# Patient Record
Sex: Male | Born: 1950
Health system: Southern US, Community
[De-identification: ages and names within clinical notes are randomized; demographics above are authoritative.]

## PROBLEM LIST (undated history)

## (undated) DIAGNOSIS — R809 Proteinuria, unspecified: Secondary | ICD-10-CM

## (undated) DIAGNOSIS — I219 Acute myocardial infarction, unspecified: Secondary | ICD-10-CM

## (undated) DIAGNOSIS — G4730 Sleep apnea, unspecified: Secondary | ICD-10-CM

## (undated) DIAGNOSIS — E785 Hyperlipidemia, unspecified: Secondary | ICD-10-CM

## (undated) DIAGNOSIS — N281 Cyst of kidney, acquired: Secondary | ICD-10-CM

## (undated) DIAGNOSIS — T7840XA Allergy, unspecified, initial encounter: Secondary | ICD-10-CM

## (undated) DIAGNOSIS — R011 Cardiac murmur, unspecified: Secondary | ICD-10-CM

## (undated) DIAGNOSIS — H269 Unspecified cataract: Secondary | ICD-10-CM

## (undated) DIAGNOSIS — I2510 Atherosclerotic heart disease of native coronary artery without angina pectoris: Secondary | ICD-10-CM

## (undated) DIAGNOSIS — Z5189 Encounter for other specified aftercare: Secondary | ICD-10-CM

## (undated) DIAGNOSIS — I10 Essential (primary) hypertension: Secondary | ICD-10-CM

## (undated) DIAGNOSIS — I77810 Thoracic aortic ectasia: Secondary | ICD-10-CM

## (undated) DIAGNOSIS — K5790 Diverticulosis of intestine, part unspecified, without perforation or abscess without bleeding: Secondary | ICD-10-CM

## (undated) DIAGNOSIS — R0683 Snoring: Secondary | ICD-10-CM

## (undated) DIAGNOSIS — Z951 Presence of aortocoronary bypass graft: Secondary | ICD-10-CM

## (undated) DIAGNOSIS — M1990 Unspecified osteoarthritis, unspecified site: Secondary | ICD-10-CM

## (undated) HISTORY — DX: Presence of aortocoronary bypass graft: Z95.1

## (undated) HISTORY — DX: Cardiac murmur, unspecified: R01.1

## (undated) HISTORY — DX: Hyperlipidemia, unspecified: E78.5

## (undated) HISTORY — DX: Thoracic aortic ectasia: I77.810

## (undated) HISTORY — DX: Allergy, unspecified, initial encounter: T78.40XA

## (undated) HISTORY — DX: Essential (primary) hypertension: I10

## (undated) HISTORY — DX: Atherosclerotic heart disease of native coronary artery without angina pectoris: I25.10

## (undated) HISTORY — DX: Encounter for other specified aftercare: Z51.89

## (undated) HISTORY — PX: OTHER SURGICAL HISTORY: SHX169

## (undated) HISTORY — DX: Sleep apnea, unspecified: G47.30

## (undated) HISTORY — DX: Proteinuria, unspecified: R80.9

## (undated) HISTORY — DX: Acute myocardial infarction, unspecified: I21.9

## (undated) HISTORY — PX: COLONOSCOPY: SHX174

## (undated) HISTORY — DX: Unspecified osteoarthritis, unspecified site: M19.90

## (undated) HISTORY — DX: Diverticulosis of intestine, part unspecified, without perforation or abscess without bleeding: K57.90

## (undated) HISTORY — DX: Snoring: R06.83

## (undated) HISTORY — PX: POLYPECTOMY: SHX149

## (undated) HISTORY — DX: Unspecified cataract: H26.9

## (undated) HISTORY — DX: Cyst of kidney, acquired: N28.1

## (undated) HISTORY — PX: TONSILECTOMY, ADENOIDECTOMY, BILATERAL MYRINGOTOMY AND TUBES: SHX2538

---

## 1976-01-31 HISTORY — PX: APPENDECTOMY: SHX54

## 1976-07-02 HISTORY — PX: APPENDECTOMY: SHX54

## 1997-07-02 HISTORY — PX: OTHER SURGICAL HISTORY: SHX169

## 1997-12-22 ENCOUNTER — Ambulatory Visit: Admission: RE | Admit: 1997-12-22 | Discharge: 1997-12-22 | Payer: Self-pay | Admitting: Internal Medicine

## 1998-01-19 ENCOUNTER — Observation Stay: Admission: RE | Admit: 1998-01-19 | Discharge: 1998-01-20 | Payer: Self-pay | Admitting: Otolaryngology

## 1998-04-01 ENCOUNTER — Ambulatory Visit: Admission: RE | Admit: 1998-04-01 | Discharge: 1998-04-01 | Payer: Self-pay | Admitting: Otolaryngology

## 2004-07-07 ENCOUNTER — Ambulatory Visit: Payer: Self-pay | Admitting: Internal Medicine

## 2004-07-14 ENCOUNTER — Ambulatory Visit: Payer: Self-pay | Admitting: Internal Medicine

## 2004-08-21 ENCOUNTER — Encounter: Admission: RE | Admit: 2004-08-21 | Discharge: 2004-08-21 | Payer: Self-pay | Admitting: Internal Medicine

## 2004-08-21 ENCOUNTER — Ambulatory Visit: Payer: Self-pay | Admitting: Internal Medicine

## 2004-10-06 ENCOUNTER — Ambulatory Visit: Payer: Self-pay | Admitting: Internal Medicine

## 2004-10-17 ENCOUNTER — Ambulatory Visit: Payer: Self-pay | Admitting: Internal Medicine

## 2005-02-15 ENCOUNTER — Ambulatory Visit: Payer: Self-pay | Admitting: Internal Medicine

## 2005-02-23 ENCOUNTER — Ambulatory Visit: Payer: Self-pay | Admitting: Internal Medicine

## 2005-06-11 ENCOUNTER — Ambulatory Visit: Payer: Self-pay | Admitting: Internal Medicine

## 2005-06-18 ENCOUNTER — Ambulatory Visit: Payer: Self-pay | Admitting: Internal Medicine

## 2005-07-02 LAB — HM COLONOSCOPY

## 2005-09-17 ENCOUNTER — Ambulatory Visit: Payer: Self-pay | Admitting: Internal Medicine

## 2005-09-21 ENCOUNTER — Ambulatory Visit: Payer: Self-pay | Admitting: Internal Medicine

## 2006-01-01 ENCOUNTER — Ambulatory Visit: Payer: Self-pay | Admitting: Internal Medicine

## 2006-01-09 ENCOUNTER — Ambulatory Visit: Payer: Self-pay | Admitting: Internal Medicine

## 2006-04-05 ENCOUNTER — Ambulatory Visit: Payer: Self-pay | Admitting: Internal Medicine

## 2006-04-11 ENCOUNTER — Ambulatory Visit: Payer: Self-pay | Admitting: Internal Medicine

## 2006-05-28 ENCOUNTER — Ambulatory Visit: Payer: Self-pay | Admitting: Internal Medicine

## 2006-06-17 ENCOUNTER — Ambulatory Visit: Payer: Self-pay | Admitting: Internal Medicine

## 2006-07-09 ENCOUNTER — Ambulatory Visit: Payer: Self-pay | Admitting: Internal Medicine

## 2006-07-09 LAB — CONVERTED CEMR LAB
ALT: 30 units/L (ref 0–40)
Albumin: 3.8 g/dL (ref 3.5–5.2)
Alkaline Phosphatase: 46 units/L (ref 39–117)
Calcium: 9.4 mg/dL (ref 8.4–10.5)
Cholesterol: 146 mg/dL (ref 0–200)
Glucose, Bld: 76 mg/dL (ref 70–99)
Hgb A1c MFr Bld: 6.1 % — ABNORMAL HIGH (ref 4.6–6.0)
Potassium: 4.4 meq/L (ref 3.5–5.1)
Total Bilirubin: 1.3 mg/dL — ABNORMAL HIGH (ref 0.3–1.2)
Total Protein: 6.9 g/dL (ref 6.0–8.3)

## 2006-07-16 ENCOUNTER — Ambulatory Visit: Payer: Self-pay | Admitting: Internal Medicine

## 2006-08-10 ENCOUNTER — Ambulatory Visit: Payer: Self-pay | Admitting: Family Medicine

## 2006-10-11 ENCOUNTER — Ambulatory Visit: Payer: Self-pay | Admitting: Internal Medicine

## 2006-10-11 LAB — CONVERTED CEMR LAB
ALT: 22 units/L (ref 0–40)
Albumin: 4 g/dL (ref 3.5–5.2)
Alkaline Phosphatase: 39 units/L (ref 39–117)
BUN: 22 mg/dL (ref 6–23)
CO2: 29 meq/L (ref 19–32)
Calcium: 9 mg/dL (ref 8.4–10.5)
Direct LDL: 71.4 mg/dL
GFR calc Af Amer: 100 mL/min
GFR calc non Af Amer: 82 mL/min
Potassium: 4.5 meq/L (ref 3.5–5.1)
Total CHOL/HDL Ratio: 4
Triglycerides: 263 mg/dL (ref 0–149)
VLDL: 53 mg/dL — ABNORMAL HIGH (ref 0–40)

## 2006-10-18 ENCOUNTER — Ambulatory Visit: Payer: Self-pay | Admitting: Internal Medicine

## 2006-12-06 ENCOUNTER — Ambulatory Visit: Payer: Self-pay | Admitting: Internal Medicine

## 2007-01-25 DIAGNOSIS — B009 Herpesviral infection, unspecified: Secondary | ICD-10-CM | POA: Insufficient documentation

## 2007-01-25 DIAGNOSIS — E782 Mixed hyperlipidemia: Secondary | ICD-10-CM | POA: Insufficient documentation

## 2007-01-25 DIAGNOSIS — E785 Hyperlipidemia, unspecified: Secondary | ICD-10-CM | POA: Insufficient documentation

## 2007-01-25 DIAGNOSIS — R809 Proteinuria, unspecified: Secondary | ICD-10-CM | POA: Insufficient documentation

## 2007-04-04 ENCOUNTER — Ambulatory Visit: Payer: Self-pay | Admitting: Internal Medicine

## 2007-04-04 LAB — CONVERTED CEMR LAB
BUN: 16 mg/dL (ref 6–23)
CO2: 31 meq/L (ref 19–32)
Calcium: 9 mg/dL (ref 8.4–10.5)
Chloride: 105 meq/L (ref 96–112)
Creatinine, Ser: 1 mg/dL (ref 0.4–1.5)
Hgb A1c MFr Bld: 6.4 % — ABNORMAL HIGH (ref 4.6–6.0)
Vit D, 1,25-Dihydroxy: 46 (ref 30–89)

## 2007-04-11 ENCOUNTER — Ambulatory Visit: Payer: Self-pay | Admitting: Internal Medicine

## 2007-06-04 ENCOUNTER — Encounter: Payer: Self-pay | Admitting: Internal Medicine

## 2007-07-03 HISTORY — PX: CHOLECYSTECTOMY: SHX55

## 2007-07-03 HISTORY — PX: CORONARY ARTERY BYPASS GRAFT: SHX141

## 2007-07-07 ENCOUNTER — Telehealth: Payer: Self-pay | Admitting: Internal Medicine

## 2007-07-09 ENCOUNTER — Ambulatory Visit: Payer: Self-pay | Admitting: Internal Medicine

## 2007-07-09 DIAGNOSIS — E1169 Type 2 diabetes mellitus with other specified complication: Secondary | ICD-10-CM

## 2007-07-09 DIAGNOSIS — E669 Obesity, unspecified: Secondary | ICD-10-CM | POA: Insufficient documentation

## 2007-07-09 LAB — CONVERTED CEMR LAB
ALT: 25 units/L (ref 0–53)
AST: 22 units/L (ref 0–37)
Bilirubin, Direct: 0.1 mg/dL (ref 0.0–0.3)
CO2: 31 meq/L (ref 19–32)
Calcium: 9.3 mg/dL (ref 8.4–10.5)
Chloride: 104 meq/L (ref 96–112)
Cholesterol: 152 mg/dL (ref 0–200)
GFR calc Af Amer: 89 mL/min
GFR calc non Af Amer: 74 mL/min
Glucose, Bld: 130 mg/dL — ABNORMAL HIGH (ref 70–99)
Hgb A1c MFr Bld: 6.4 % — ABNORMAL HIGH (ref 4.6–6.0)
Sodium: 142 meq/L (ref 135–145)
Total CHOL/HDL Ratio: 5.6
Total Protein: 6.4 g/dL (ref 6.0–8.3)
Triglycerides: 251 mg/dL (ref 0–149)

## 2007-07-18 ENCOUNTER — Ambulatory Visit: Payer: Self-pay | Admitting: Internal Medicine

## 2007-09-04 ENCOUNTER — Telehealth: Payer: Self-pay | Admitting: Internal Medicine

## 2007-09-10 ENCOUNTER — Encounter: Payer: Self-pay | Admitting: Internal Medicine

## 2007-09-12 ENCOUNTER — Encounter: Payer: Self-pay | Admitting: Internal Medicine

## 2007-10-06 ENCOUNTER — Encounter: Payer: Self-pay | Admitting: Internal Medicine

## 2007-10-08 ENCOUNTER — Telehealth: Payer: Self-pay | Admitting: Internal Medicine

## 2007-11-11 ENCOUNTER — Ambulatory Visit: Payer: Self-pay | Admitting: Internal Medicine

## 2007-11-11 DIAGNOSIS — R17 Unspecified jaundice: Secondary | ICD-10-CM | POA: Insufficient documentation

## 2007-11-11 DIAGNOSIS — R109 Unspecified abdominal pain: Secondary | ICD-10-CM | POA: Insufficient documentation

## 2007-11-11 LAB — CONVERTED CEMR LAB
Basophils Absolute: 0 10*3/uL (ref 0.0–0.1)
Bilirubin, Direct: 3.8 mg/dL — ABNORMAL HIGH (ref 0.0–0.3)
Calcium: 9.1 mg/dL (ref 8.4–10.5)
Crystals: NEGATIVE
Eosinophils Absolute: 0.1 10*3/uL (ref 0.0–0.7)
GFR calc Af Amer: 80 mL/min
GFR calc non Af Amer: 66 mL/min
Glucose, Bld: 133 mg/dL — ABNORMAL HIGH (ref 70–99)
HCT: 43.4 % (ref 39.0–52.0)
Hemoglobin: 14.9 g/dL (ref 13.0–17.0)
Hep A IgM: NEGATIVE
Hep B C IgM: NEGATIVE
Hepatitis B Surface Ag: NEGATIVE
MCHC: 34.2 g/dL (ref 30.0–36.0)
MCV: 92.8 fL (ref 78.0–100.0)
Monocytes Absolute: 0.6 10*3/uL (ref 0.1–1.0)
Neutro Abs: 4.1 10*3/uL (ref 1.4–7.7)
Nitrite: NEGATIVE
Platelets: 114 10*3/uL — ABNORMAL LOW (ref 150–400)
Potassium: 4 meq/L (ref 3.5–5.1)
RDW: 12.2 % (ref 11.5–14.6)
Sodium: 140 meq/L (ref 135–145)
Total Protein, Urine: 100 mg/dL — AB
Total Protein: 6.8 g/dL (ref 6.0–8.3)
pH: 5.5 (ref 5.0–8.0)

## 2007-11-13 ENCOUNTER — Telehealth: Payer: Self-pay | Admitting: Internal Medicine

## 2007-11-13 ENCOUNTER — Ambulatory Visit: Payer: Self-pay | Admitting: Internal Medicine

## 2007-11-13 DIAGNOSIS — R945 Abnormal results of liver function studies: Secondary | ICD-10-CM

## 2007-11-13 LAB — CONVERTED CEMR LAB
Hep A Total Ab: POSITIVE — AB
Hep B S Ab: POSITIVE — AB

## 2007-11-14 LAB — CONVERTED CEMR LAB
ALT: 165 units/L — ABNORMAL HIGH (ref 0–53)
AST: 72 units/L — ABNORMAL HIGH (ref 0–37)
Albumin: 3.7 g/dL (ref 3.5–5.2)
Alkaline Phosphatase: 81 units/L (ref 39–117)
BUN: 13 mg/dL (ref 6–23)
Bilirubin, Direct: 2.2 mg/dL — ABNORMAL HIGH (ref 0.0–0.3)
CO2: 27 meq/L (ref 19–32)
Calcium: 9.1 mg/dL (ref 8.4–10.5)
Chloride: 102 meq/L (ref 96–112)
Creatinine, Ser: 1.2 mg/dL (ref 0.4–1.5)
GFR calc Af Amer: 80 mL/min
GFR calc non Af Amer: 66 mL/min
Glucose, Bld: 138 mg/dL — ABNORMAL HIGH (ref 70–99)
Hgb A1c MFr Bld: 6.8 % — ABNORMAL HIGH (ref 4.6–6.0)
Potassium: 4.1 meq/L (ref 3.5–5.1)
Sodium: 137 meq/L (ref 135–145)
Total Bilirubin: 4.5 mg/dL — ABNORMAL HIGH (ref 0.3–1.2)
Total Protein: 7.2 g/dL (ref 6.0–8.3)

## 2007-11-15 ENCOUNTER — Telehealth: Payer: Self-pay | Admitting: Internal Medicine

## 2007-11-17 ENCOUNTER — Telehealth: Payer: Self-pay | Admitting: Internal Medicine

## 2007-11-17 ENCOUNTER — Ambulatory Visit: Payer: Self-pay | Admitting: Internal Medicine

## 2007-11-19 LAB — CONVERTED CEMR LAB
AST: 59 units/L — ABNORMAL HIGH (ref 0–37)
Alkaline Phosphatase: 60 units/L (ref 39–117)
Basophils Absolute: 0.1 10*3/uL (ref 0.0–0.1)
Chloride: 108 meq/L (ref 96–112)
Creatinine, Ser: 1.1 mg/dL (ref 0.4–1.5)
Eosinophils Absolute: 0.1 10*3/uL (ref 0.0–0.7)
GFR calc non Af Amer: 73 mL/min
MCHC: 34.9 g/dL (ref 30.0–36.0)
MCV: 92.6 fL (ref 78.0–100.0)
Neutrophils Relative %: 54.8 % (ref 43.0–77.0)
Platelets: 187 10*3/uL (ref 150–400)
Potassium: 4.2 meq/L (ref 3.5–5.1)
RDW: 11.8 % (ref 11.5–14.6)
Sodium: 141 meq/L (ref 135–145)
Total Bilirubin: 2 mg/dL — ABNORMAL HIGH (ref 0.3–1.2)

## 2007-11-20 ENCOUNTER — Ambulatory Visit: Payer: Self-pay | Admitting: Internal Medicine

## 2007-11-20 ENCOUNTER — Encounter: Payer: Self-pay | Admitting: Internal Medicine

## 2007-11-20 ENCOUNTER — Encounter: Admission: RE | Admit: 2007-11-20 | Discharge: 2007-11-20 | Payer: Self-pay | Admitting: Internal Medicine

## 2007-11-20 LAB — CONVERTED CEMR LAB
ALT: 81 units/L — ABNORMAL HIGH (ref 0–53)
AST: 43 units/L — ABNORMAL HIGH (ref 0–37)
Total Bilirubin: 1.4 mg/dL — ABNORMAL HIGH (ref 0.3–1.2)

## 2007-11-21 ENCOUNTER — Ambulatory Visit: Payer: Self-pay | Admitting: Internal Medicine

## 2007-11-21 DIAGNOSIS — K8021 Calculus of gallbladder without cholecystitis with obstruction: Secondary | ICD-10-CM | POA: Insufficient documentation

## 2007-11-21 DIAGNOSIS — N281 Cyst of kidney, acquired: Secondary | ICD-10-CM

## 2007-11-27 ENCOUNTER — Ambulatory Visit (HOSPITAL_COMMUNITY): Admission: RE | Admit: 2007-11-27 | Discharge: 2007-11-27 | Payer: Self-pay | Admitting: Internal Medicine

## 2007-12-15 ENCOUNTER — Telehealth: Payer: Self-pay | Admitting: Internal Medicine

## 2008-01-06 ENCOUNTER — Ambulatory Visit: Payer: Self-pay | Admitting: Internal Medicine

## 2008-01-06 LAB — CONVERTED CEMR LAB
ALT: 45 units/L (ref 0–53)
Total Bilirubin: 1.4 mg/dL — ABNORMAL HIGH (ref 0.3–1.2)

## 2008-01-08 ENCOUNTER — Encounter: Payer: Self-pay | Admitting: Internal Medicine

## 2008-01-13 ENCOUNTER — Telehealth: Payer: Self-pay | Admitting: Internal Medicine

## 2008-01-19 ENCOUNTER — Telehealth: Payer: Self-pay | Admitting: Internal Medicine

## 2008-01-19 ENCOUNTER — Ambulatory Visit: Payer: Self-pay | Admitting: Internal Medicine

## 2008-01-19 DIAGNOSIS — IMO0002 Reserved for concepts with insufficient information to code with codable children: Secondary | ICD-10-CM

## 2008-02-05 ENCOUNTER — Ambulatory Visit: Payer: Self-pay | Admitting: Internal Medicine

## 2008-02-05 LAB — CONVERTED CEMR LAB
ALT: 31 units/L (ref 0–53)
AST: 24 units/L (ref 0–37)
Albumin: 4.4 g/dL (ref 3.5–5.2)
BUN: 12 mg/dL (ref 6–23)
Basophils Relative: 0.9 % (ref 0.0–3.0)
CO2: 31 meq/L (ref 19–32)
Chloride: 104 meq/L (ref 96–112)
Creatinine, Ser: 1.1 mg/dL (ref 0.4–1.5)
Eosinophils Relative: 1 % (ref 0.0–5.0)
Glucose, Bld: 108 mg/dL — ABNORMAL HIGH (ref 70–99)
Lymphocytes Relative: 37.6 % (ref 12.0–46.0)
MCV: 91.1 fL (ref 78.0–100.0)
Monocytes Relative: 9.1 % (ref 3.0–12.0)
Neutrophils Relative %: 51.4 % (ref 43.0–77.0)
RBC: 4.87 M/uL (ref 4.22–5.81)
TSH: 1.17 microintl units/mL (ref 0.35–5.50)
WBC: 6.3 10*3/uL (ref 4.5–10.5)

## 2008-02-13 ENCOUNTER — Encounter: Payer: Self-pay | Admitting: Internal Medicine

## 2008-02-13 ENCOUNTER — Ambulatory Visit: Payer: Self-pay

## 2008-02-23 ENCOUNTER — Ambulatory Visit (HOSPITAL_COMMUNITY): Admission: RE | Admit: 2008-02-23 | Discharge: 2008-02-23 | Payer: Self-pay | Admitting: General Surgery

## 2008-02-23 ENCOUNTER — Encounter (INDEPENDENT_AMBULATORY_CARE_PROVIDER_SITE_OTHER): Payer: Self-pay | Admitting: General Surgery

## 2008-02-23 HISTORY — PX: CHOLECYSTECTOMY: SHX55

## 2008-03-11 ENCOUNTER — Encounter: Payer: Self-pay | Admitting: Internal Medicine

## 2008-03-16 ENCOUNTER — Ambulatory Visit: Payer: Self-pay | Admitting: Internal Medicine

## 2008-03-31 ENCOUNTER — Ambulatory Visit: Payer: Self-pay | Admitting: Cardiology

## 2008-03-31 LAB — CONVERTED CEMR LAB
AST: 23 units/L (ref 0–37)
Basophils Absolute: 0 10*3/uL (ref 0.0–0.1)
Basophils Relative: 0.6 % (ref 0.0–3.0)
Chloride: 99 meq/L (ref 96–112)
Creatinine, Ser: 1.1 mg/dL (ref 0.4–1.5)
Eosinophils Absolute: 0.1 10*3/uL (ref 0.0–0.7)
GFR calc Af Amer: 89 mL/min
GFR calc non Af Amer: 73 mL/min
HCT: 43.3 % (ref 39.0–52.0)
MCHC: 35.5 g/dL (ref 30.0–36.0)
MCV: 91.2 fL (ref 78.0–100.0)
Monocytes Absolute: 0.7 10*3/uL (ref 0.1–1.0)
Neutrophils Relative %: 58.4 % (ref 43.0–77.0)
Platelets: 154 10*3/uL (ref 150–400)
Potassium: 3.7 meq/L (ref 3.5–5.1)
Total Bilirubin: 1.2 mg/dL (ref 0.3–1.2)
aPTT: 28.2 s (ref 21.7–29.8)

## 2008-04-01 DIAGNOSIS — Z951 Presence of aortocoronary bypass graft: Secondary | ICD-10-CM

## 2008-04-01 HISTORY — DX: Presence of aortocoronary bypass graft: Z95.1

## 2008-04-02 ENCOUNTER — Ambulatory Visit: Payer: Self-pay | Admitting: Vascular Surgery

## 2008-04-02 ENCOUNTER — Ambulatory Visit: Payer: Self-pay | Admitting: Internal Medicine

## 2008-04-02 ENCOUNTER — Inpatient Hospital Stay (HOSPITAL_BASED_OUTPATIENT_CLINIC_OR_DEPARTMENT_OTHER): Admission: RE | Admit: 2008-04-02 | Discharge: 2008-04-02 | Payer: Self-pay | Admitting: Internal Medicine

## 2008-04-02 ENCOUNTER — Ambulatory Visit (HOSPITAL_COMMUNITY)
Admission: RE | Admit: 2008-04-02 | Discharge: 2008-04-02 | Payer: Self-pay | Admitting: Thoracic Surgery (Cardiothoracic Vascular Surgery)

## 2008-04-02 ENCOUNTER — Encounter: Payer: Self-pay | Admitting: Thoracic Surgery (Cardiothoracic Vascular Surgery)

## 2008-04-05 ENCOUNTER — Ambulatory Visit: Payer: Self-pay | Admitting: Thoracic Surgery (Cardiothoracic Vascular Surgery)

## 2008-04-06 ENCOUNTER — Ambulatory Visit: Payer: Self-pay | Admitting: Cardiology

## 2008-04-06 ENCOUNTER — Inpatient Hospital Stay (HOSPITAL_COMMUNITY): Admission: EM | Admit: 2008-04-06 | Discharge: 2008-04-12 | Payer: Self-pay | Admitting: Emergency Medicine

## 2008-04-06 ENCOUNTER — Ambulatory Visit: Payer: Self-pay | Admitting: Thoracic Surgery (Cardiothoracic Vascular Surgery)

## 2008-04-07 ENCOUNTER — Encounter: Payer: Self-pay | Admitting: Cardiology

## 2008-04-08 ENCOUNTER — Ambulatory Visit (HOSPITAL_COMMUNITY)
Admission: RE | Admit: 2008-04-08 | Discharge: 2008-04-08 | Payer: Self-pay | Admitting: Thoracic Surgery (Cardiothoracic Vascular Surgery)

## 2008-04-08 HISTORY — PX: CORONARY ARTERY BYPASS GRAFT: SHX141

## 2008-04-09 ENCOUNTER — Telehealth (INDEPENDENT_AMBULATORY_CARE_PROVIDER_SITE_OTHER): Payer: Self-pay | Admitting: *Deleted

## 2008-04-12 ENCOUNTER — Ambulatory Visit: Payer: Self-pay | Admitting: Thoracic Surgery (Cardiothoracic Vascular Surgery)

## 2008-04-29 ENCOUNTER — Ambulatory Visit: Payer: Self-pay | Admitting: Cardiology

## 2008-05-03 ENCOUNTER — Ambulatory Visit: Payer: Self-pay | Admitting: Thoracic Surgery (Cardiothoracic Vascular Surgery)

## 2008-05-03 ENCOUNTER — Encounter
Admission: RE | Admit: 2008-05-03 | Discharge: 2008-05-03 | Payer: Self-pay | Admitting: Thoracic Surgery (Cardiothoracic Vascular Surgery)

## 2008-05-06 ENCOUNTER — Encounter (HOSPITAL_COMMUNITY): Admission: RE | Admit: 2008-05-06 | Discharge: 2008-06-30 | Payer: Self-pay | Admitting: Cardiology

## 2008-05-24 ENCOUNTER — Ambulatory Visit: Payer: Self-pay | Admitting: Cardiology

## 2008-06-07 ENCOUNTER — Ambulatory Visit: Payer: Self-pay | Admitting: Internal Medicine

## 2008-06-07 LAB — CONVERTED CEMR LAB
ALT: 16 units/L (ref 0–53)
Albumin: 4.1 g/dL (ref 3.5–5.2)
BUN: 12 mg/dL (ref 6–23)
Bilirubin, Direct: 0.2 mg/dL (ref 0.0–0.3)
CO2: 30 meq/L (ref 19–32)
Calcium: 9.7 mg/dL (ref 8.4–10.5)
Creatinine, Ser: 1.1 mg/dL (ref 0.4–1.5)
GFR calc Af Amer: 89 mL/min
Glucose, Bld: 87 mg/dL (ref 70–99)
HDL: 30.3 mg/dL — ABNORMAL LOW (ref >39.0)
Hgb A1c MFr Bld: 5.3 % (ref 4.6–6.0)
Sodium: 144 meq/L (ref 135–145)
Total Protein: 6.8 g/dL (ref 6.0–8.3)
Triglycerides: 147 mg/dL (ref 0–149)
VLDL: 29 mg/dL (ref 0–40)

## 2008-06-14 ENCOUNTER — Ambulatory Visit: Payer: Self-pay | Admitting: Internal Medicine

## 2008-06-14 DIAGNOSIS — K5900 Constipation, unspecified: Secondary | ICD-10-CM | POA: Insufficient documentation

## 2008-06-14 DIAGNOSIS — M79609 Pain in unspecified limb: Secondary | ICD-10-CM | POA: Insufficient documentation

## 2008-07-02 ENCOUNTER — Encounter (HOSPITAL_COMMUNITY): Admission: RE | Admit: 2008-07-02 | Discharge: 2008-08-30 | Payer: Self-pay | Admitting: Cardiology

## 2008-07-08 ENCOUNTER — Ambulatory Visit: Payer: Self-pay | Admitting: Cardiology

## 2008-08-31 ENCOUNTER — Telehealth: Payer: Self-pay | Admitting: Internal Medicine

## 2008-09-16 ENCOUNTER — Ambulatory Visit: Payer: Self-pay | Admitting: Internal Medicine

## 2008-09-16 LAB — CONVERTED CEMR LAB
ALT: 19 units/L (ref 0–53)
AST: 22 units/L (ref 0–37)
Calcium: 9.6 mg/dL (ref 8.4–10.5)
Cholesterol: 119 mg/dL (ref 0–200)
GFR calc non Af Amer: 73.1 mL/min (ref >60)
HDL: 39 mg/dL — ABNORMAL LOW (ref >39.00)
Hgb A1c MFr Bld: 5.7 % (ref 4.6–6.5)
Potassium: 4.8 meq/L (ref 3.5–5.1)
Sodium: 143 meq/L (ref 135–145)
Total Bilirubin: 1 mg/dL (ref 0.3–1.2)
Total Protein: 6.8 g/dL (ref 6.0–8.3)
Uric Acid, Serum: 7.8 mg/dL (ref 4.0–7.8)
VLDL: 22.4 mg/dL (ref 0.0–40.0)

## 2008-09-24 ENCOUNTER — Ambulatory Visit: Payer: Self-pay | Admitting: Internal Medicine

## 2008-09-24 ENCOUNTER — Telehealth: Payer: Self-pay | Admitting: Internal Medicine

## 2008-09-24 DIAGNOSIS — N529 Male erectile dysfunction, unspecified: Secondary | ICD-10-CM | POA: Insufficient documentation

## 2008-09-30 ENCOUNTER — Encounter: Payer: Self-pay | Admitting: Internal Medicine

## 2008-10-25 ENCOUNTER — Telehealth: Payer: Self-pay | Admitting: Internal Medicine

## 2008-11-07 DIAGNOSIS — Z951 Presence of aortocoronary bypass graft: Secondary | ICD-10-CM | POA: Insufficient documentation

## 2008-11-08 ENCOUNTER — Ambulatory Visit: Payer: Self-pay | Admitting: Cardiology

## 2008-11-08 ENCOUNTER — Encounter (INDEPENDENT_AMBULATORY_CARE_PROVIDER_SITE_OTHER): Payer: Self-pay | Admitting: *Deleted

## 2009-01-07 ENCOUNTER — Ambulatory Visit: Payer: Self-pay | Admitting: Internal Medicine

## 2009-01-07 LAB — CONVERTED CEMR LAB
BUN: 17 mg/dL (ref 6–23)
Basophils Relative: 0.5 % (ref 0.0–3.0)
CO2: 32 meq/L (ref 19–32)
Calcium: 9.5 mg/dL (ref 8.4–10.5)
Chloride: 102 meq/L (ref 96–112)
Cholesterol: 166 mg/dL (ref 0–200)
Creatinine, Ser: 1.1 mg/dL (ref 0.4–1.5)
Eosinophils Relative: 1.5 % (ref 0.0–5.0)
Hemoglobin: 16 g/dL (ref 13.0–17.0)
Hgb A1c MFr Bld: 5.9 % (ref 4.6–6.5)
LDL Cholesterol: 101 mg/dL — ABNORMAL HIGH (ref 0–99)
Lymphocytes Relative: 32.1 % (ref 12.0–46.0)
Neutro Abs: 4.4 10*3/uL (ref 1.4–7.7)
Neutrophils Relative %: 55.9 % (ref 43.0–77.0)
RBC: 4.8 M/uL (ref 4.22–5.81)
Total CHOL/HDL Ratio: 4
Triglycerides: 137 mg/dL (ref 0.0–149.0)
WBC: 7.8 10*3/uL (ref 4.5–10.5)

## 2009-01-14 ENCOUNTER — Ambulatory Visit: Payer: Self-pay | Admitting: Internal Medicine

## 2009-01-14 DIAGNOSIS — R5383 Other fatigue: Secondary | ICD-10-CM

## 2009-01-14 DIAGNOSIS — R5381 Other malaise: Secondary | ICD-10-CM | POA: Insufficient documentation

## 2009-01-14 DIAGNOSIS — E291 Testicular hypofunction: Secondary | ICD-10-CM | POA: Insufficient documentation

## 2009-02-02 ENCOUNTER — Telehealth: Payer: Self-pay | Admitting: Internal Medicine

## 2009-04-07 ENCOUNTER — Encounter (INDEPENDENT_AMBULATORY_CARE_PROVIDER_SITE_OTHER): Payer: Self-pay | Admitting: *Deleted

## 2009-04-28 ENCOUNTER — Telehealth: Payer: Self-pay | Admitting: Internal Medicine

## 2009-05-12 ENCOUNTER — Telehealth: Payer: Self-pay | Admitting: Cardiology

## 2009-05-13 ENCOUNTER — Encounter (INDEPENDENT_AMBULATORY_CARE_PROVIDER_SITE_OTHER): Payer: Self-pay | Admitting: *Deleted

## 2009-05-13 ENCOUNTER — Ambulatory Visit: Payer: Self-pay | Admitting: Internal Medicine

## 2009-05-16 LAB — CONVERTED CEMR LAB
ALT: 18 units/L (ref 0–53)
AST: 20 units/L (ref 0–37)
Albumin: 4.5 g/dL (ref 3.5–5.2)
Alkaline Phosphatase: 40 units/L (ref 39–117)
BUN: 14 mg/dL (ref 6–23)
Basophils Absolute: 0 10*3/uL (ref 0.0–0.1)
Basophils Relative: 0.4 % (ref 0.0–3.0)
Bilirubin Urine: NEGATIVE
Bilirubin, Direct: 0.2 mg/dL (ref 0.0–0.3)
CO2: 31 meq/L (ref 19–32)
Calcium: 9.5 mg/dL (ref 8.4–10.5)
Chloride: 105 meq/L (ref 96–112)
Cholesterol: 140 mg/dL (ref 0–200)
Creatinine, Ser: 1.2 mg/dL (ref 0.4–1.5)
Eosinophils Absolute: 0.1 10*3/uL (ref 0.0–0.7)
Eosinophils Relative: 1.7 % (ref 0.0–5.0)
GFR calc non Af Amer: 65.97 mL/min (ref >60)
Glucose, Bld: 139 mg/dL — ABNORMAL HIGH (ref 70–99)
HCT: 45.3 % (ref 39.0–52.0)
HDL: 34.8 mg/dL — ABNORMAL LOW (ref >39.00)
Hemoglobin, Urine: NEGATIVE
Hemoglobin: 15.6 g/dL (ref 13.0–17.0)
Hgb A1c MFr Bld: 6.3 % (ref 4.6–6.5)
Ketones, ur: NEGATIVE mg/dL
LDL Cholesterol: 65 mg/dL (ref 0–99)
Leukocytes, UA: NEGATIVE
Lymphocytes Relative: 34.4 % (ref 12.0–46.0)
Lymphs Abs: 1.9 10*3/uL (ref 0.7–4.0)
MCHC: 34.4 g/dL (ref 30.0–36.0)
MCV: 97.1 fL (ref 78.0–100.0)
Monocytes Absolute: 0.5 10*3/uL (ref 0.1–1.0)
Monocytes Relative: 9.7 % (ref 3.0–12.0)
Neutro Abs: 3.1 10*3/uL (ref 1.4–7.7)
Neutrophils Relative %: 53.8 % (ref 43.0–77.0)
Nitrite: NEGATIVE
PSA: 0.89 ng/mL (ref 0.10–4.00)
Platelets: 153 10*3/uL (ref 150.0–400.0)
Potassium: 4.1 meq/L (ref 3.5–5.1)
RBC: 4.67 M/uL (ref 4.22–5.81)
RDW: 12.1 % (ref 11.5–14.6)
Sodium: 143 meq/L (ref 135–145)
Specific Gravity, Urine: 1.015 (ref 1.000–1.030)
TSH: 1.2 microintl units/mL (ref 0.35–5.50)
Testosterone: 203.65 ng/dL — ABNORMAL LOW (ref 350.00–890.00)
Total Bilirubin: 1.5 mg/dL — ABNORMAL HIGH (ref 0.3–1.2)
Total CHOL/HDL Ratio: 4
Total Protein, Urine: NEGATIVE mg/dL
Total Protein: 6.9 g/dL (ref 6.0–8.3)
Triglycerides: 200 mg/dL — ABNORMAL HIGH (ref 0.0–149.0)
Urine Glucose: NEGATIVE mg/dL
Urobilinogen, UA: 0.2 (ref 0.0–1.0)
VLDL: 40 mg/dL (ref 0.0–40.0)
Vitamin B-12: 389 pg/mL (ref 211–911)
WBC: 5.6 10*3/uL (ref 4.5–10.5)
pH: 6 (ref 5.0–8.0)

## 2009-05-18 ENCOUNTER — Telehealth: Payer: Self-pay | Admitting: Cardiology

## 2009-05-20 ENCOUNTER — Ambulatory Visit: Payer: Self-pay | Admitting: Internal Medicine

## 2009-06-13 ENCOUNTER — Telehealth: Payer: Self-pay | Admitting: Cardiology

## 2009-06-16 ENCOUNTER — Encounter: Payer: Self-pay | Admitting: Cardiology

## 2009-06-17 ENCOUNTER — Ambulatory Visit: Payer: Self-pay | Admitting: Cardiology

## 2009-06-17 DIAGNOSIS — R42 Dizziness and giddiness: Secondary | ICD-10-CM

## 2009-06-30 ENCOUNTER — Telehealth (INDEPENDENT_AMBULATORY_CARE_PROVIDER_SITE_OTHER): Payer: Self-pay | Admitting: *Deleted

## 2009-07-04 ENCOUNTER — Encounter (HOSPITAL_COMMUNITY): Admission: RE | Admit: 2009-07-04 | Discharge: 2009-09-05 | Payer: Self-pay | Admitting: Cardiology

## 2009-07-04 ENCOUNTER — Ambulatory Visit: Payer: Self-pay | Admitting: Cardiology

## 2009-07-04 ENCOUNTER — Telehealth: Payer: Self-pay | Admitting: Cardiology

## 2009-07-04 ENCOUNTER — Ambulatory Visit: Payer: Self-pay

## 2009-07-22 ENCOUNTER — Ambulatory Visit: Payer: Self-pay | Admitting: Cardiology

## 2009-08-22 ENCOUNTER — Ambulatory Visit: Payer: Self-pay | Admitting: Internal Medicine

## 2009-08-22 ENCOUNTER — Telehealth (INDEPENDENT_AMBULATORY_CARE_PROVIDER_SITE_OTHER): Payer: Self-pay | Admitting: *Deleted

## 2009-09-16 ENCOUNTER — Ambulatory Visit: Payer: Self-pay | Admitting: Internal Medicine

## 2009-09-18 LAB — CONVERTED CEMR LAB
AST: 17 units/L (ref 0–37)
Albumin: 4.1 g/dL (ref 3.5–5.2)
CO2: 30 meq/L (ref 19–32)
Calcium: 9.5 mg/dL (ref 8.4–10.5)
Chloride: 105 meq/L (ref 96–112)
Glucose, Bld: 150 mg/dL — ABNORMAL HIGH (ref 70–99)
HDL: 40.8 mg/dL (ref >39.00)
Hgb A1c MFr Bld: 6.6 % — ABNORMAL HIGH (ref 4.6–6.5)
LDL Cholesterol: 70 mg/dL (ref 0–99)
Potassium: 4.6 meq/L (ref 3.5–5.1)
Sodium: 141 meq/L (ref 135–145)
Testosterone: 225.95 ng/dL — ABNORMAL LOW (ref 350.00–890.00)
Total Bilirubin: 1 mg/dL (ref 0.3–1.2)
Total CHOL/HDL Ratio: 4
Triglycerides: 191 mg/dL — ABNORMAL HIGH (ref 0.0–149.0)
Uric Acid, Serum: 7.9 mg/dL — ABNORMAL HIGH (ref 4.0–7.8)
VLDL: 38.2 mg/dL (ref 0.0–40.0)

## 2009-09-23 ENCOUNTER — Ambulatory Visit: Payer: Self-pay | Admitting: Internal Medicine

## 2009-09-23 DIAGNOSIS — M654 Radial styloid tenosynovitis [de Quervain]: Secondary | ICD-10-CM

## 2009-10-25 ENCOUNTER — Telehealth: Payer: Self-pay | Admitting: Internal Medicine

## 2009-11-16 ENCOUNTER — Encounter (INDEPENDENT_AMBULATORY_CARE_PROVIDER_SITE_OTHER): Payer: Self-pay | Admitting: *Deleted

## 2009-12-23 ENCOUNTER — Telehealth: Payer: Self-pay | Admitting: Internal Medicine

## 2009-12-26 ENCOUNTER — Ambulatory Visit: Payer: Self-pay | Admitting: Internal Medicine

## 2009-12-26 ENCOUNTER — Telehealth: Payer: Self-pay | Admitting: Cardiology

## 2009-12-27 LAB — CONVERTED CEMR LAB
AST: 21 units/L (ref 0–37)
Albumin: 4.3 g/dL (ref 3.5–5.2)
Alkaline Phosphatase: 38 units/L — ABNORMAL LOW (ref 39–117)
Calcium: 9.3 mg/dL (ref 8.4–10.5)
GFR calc non Af Amer: 67.78 mL/min (ref >60)
Glucose, Bld: 174 mg/dL — ABNORMAL HIGH (ref 70–99)
Potassium: 4.2 meq/L (ref 3.5–5.1)
Sodium: 140 meq/L (ref 135–145)
Total Bilirubin: 1.3 mg/dL — ABNORMAL HIGH (ref 0.3–1.2)

## 2010-01-03 ENCOUNTER — Ambulatory Visit: Payer: Self-pay | Admitting: Internal Medicine

## 2010-01-11 ENCOUNTER — Ambulatory Visit: Payer: Self-pay | Admitting: Cardiology

## 2010-01-11 DIAGNOSIS — E663 Overweight: Secondary | ICD-10-CM | POA: Insufficient documentation

## 2010-01-19 ENCOUNTER — Encounter: Payer: Self-pay | Admitting: Internal Medicine

## 2010-04-03 ENCOUNTER — Telehealth: Payer: Self-pay | Admitting: Internal Medicine

## 2010-05-08 ENCOUNTER — Ambulatory Visit: Payer: Self-pay | Admitting: Internal Medicine

## 2010-05-10 LAB — CONVERTED CEMR LAB
ALT: 21 units/L (ref 0–53)
AST: 21 units/L (ref 0–37)
Albumin: 4 g/dL (ref 3.5–5.2)
BUN: 20 mg/dL (ref 6–23)
Chloride: 105 meq/L (ref 96–112)
Cholesterol: 175 mg/dL (ref 0–200)
Direct LDL: 100.8 mg/dL
Eosinophils Relative: 2 % (ref 0.0–5.0)
GFR calc non Af Amer: 69.75 mL/min (ref >60)
Glucose, Bld: 173 mg/dL — ABNORMAL HIGH (ref 70–99)
HCT: 42.7 % (ref 39.0–52.0)
Hemoglobin: 14.9 g/dL (ref 13.0–17.0)
Hgb A1c MFr Bld: 7.4 % — ABNORMAL HIGH (ref 4.6–6.5)
Lymphs Abs: 2.5 10*3/uL (ref 0.7–4.0)
MCV: 95.2 fL (ref 78.0–100.0)
Monocytes Absolute: 0.7 10*3/uL (ref 0.1–1.0)
Monocytes Relative: 9.9 % (ref 3.0–12.0)
Neutro Abs: 3.4 10*3/uL (ref 1.4–7.7)
PSA: 0.88 ng/mL (ref 0.10–4.00)
Potassium: 4.4 meq/L (ref 3.5–5.1)
Sodium: 141 meq/L (ref 135–145)
Specific Gravity, Urine: >=1.03 (ref 1.000–1.030)
TSH: 1.77 microintl units/mL (ref 0.35–5.50)
Testosterone: 179.2 ng/dL — ABNORMAL LOW (ref 350.00–890.00)
Total Protein: 6.2 g/dL (ref 6.0–8.3)
Urine Glucose: NEGATIVE mg/dL
Urobilinogen, UA: 0.2 (ref 0.0–1.0)
WBC: 6.7 10*3/uL (ref 4.5–10.5)
pH: 5 (ref 5.0–8.0)

## 2010-05-24 ENCOUNTER — Ambulatory Visit: Payer: Self-pay | Admitting: Internal Medicine

## 2010-08-01 NOTE — Progress Notes (Signed)
Summary: ntg refill  Medications Added NITROSTAT 0.4 MG SUBL (NITROGLYCERIN) 1 tablet under tongue at onset of chest pain; you may repeat every 5 minutes for up to 3 doses.       Phone Note Other Incoming   Summary of Call: pt in for [facility] today ask for a prescription for ntg, ok per Dr Myrtis Ser, [facility] Initial call taken by: Meredith Staggers, RN,  July 04, 2009 10:49 AM    New/Updated Medications: NITROSTAT 0.4 MG SUBL (NITROGLYCERIN) 1 tablet under tongue at onset of chest pain; you may repeat every 5 minutes for up to 3 doses. Prescriptions: NITROSTAT 0.4 MG SUBL (NITROGLYCERIN) 1 tablet under tongue at onset of chest pain; you may repeat every 5 minutes for up to 3 doses.  #50 x 3   Entered by:   Meredith Staggers, RN   Authorized by:   Talitha Givens, MD, [facility]   Signed by:   Meredith Staggers, RN on 07/04/2009   Method used:   Electronically to        [facility] (retail[facility]       [address]       [place], [place]  [address]       Ph: [phone] or [phone]       Fax: [phone]   RxID:   [id number]

## 2010-08-01 NOTE — Progress Notes (Signed)
Summary: Immunizations for trip   Phone Note Call from Patient Call back at [facility] Phone [phone]   Summary of Call: Pt's wife called. Patient is going to [place] x 2 wk and then [place] and wants to know what immunization patient may need.   - ALSO needs refills of meds. Initial call taken by: Lamar Sprinkles, CMA,  April 03, 2010 8:55 AM  Follow-up for Phone Call        If no travel to countryside - needs to have up to date tDap, Flu, Hepatitis A/B shots. Check CDC website for info. Follow-up by: Tresa Garter MD,  April 03, 2010 1:11 PM  Additional Follow-up for Phone Call Additional follow up Details #1::        Chart ordered...............Marland KitchenLamar Sprinkles, CMA  April 03, 2010 5:08 PM   Pt is due for tdap, flu and hep A/B. Spoke w/wife if inform and also left detailed vm on home # for patient per wife's request. Patient is requesting rx's for nausea, dizzyness, cold sores (valtrex?) and refill of cipro to keep with him. Additional Follow-up by: Lamar Sprinkles, CMA,  April 04, 2010 5:41 PM    Additional Follow-up for Phone Call Additional follow up Details #2::    ok agree Follow-up by: Tresa Garter MD,  April 05, 2010 1:04 PM  Additional Follow-up for Phone Call Additional follow up Details #3:: Details for Additional Follow-up Action Taken: Pt's wife informed Additional Follow-up by: Lamar Sprinkles, CMA,  April 05, 2010 2:55 PM  New/Updated Medications: VALTREX 500 MG TABS (VALACYCLOVIR HCL) 1 by mouth three times a day x 7 d as needed herpes PROMETHAZINE HCL 25 MG TABS (PROMETHAZINE HCL) 1-2 by mouth four times a day as needed nausea Prescriptions: PROMETHAZINE HCL 25 MG TABS (PROMETHAZINE HCL) 1-2 by mouth four times a day as needed nausea  #60 x 1   Entered by:   Lamar Sprinkles, CMA   Authorized by:   Tresa Garter MD   Signed by:   Lamar Sprinkles, CMA on 04/05/2010   Method used:   Electronically to        [facility] ([facility]    [address]       [place], [place]  [address]       Ph: [phone] or [phone]       Fax: [phone]   RxID:   [id number] VALTREX 500 MG TABS (VALACYCLOVIR HCL) 1 by mouth three times a day x 7 d as needed herpes  #21 x 2   Entered by:   Lamar Sprinkles, CMA   Authorized by:   Tresa Garter MD   Signed by:   Lamar Sprinkles, CMA on 04/05/2010   Method used:   Electronically to        [facility] ([facility]       [address]       [place], [place]  [address]       Ph: [phone] or [phone]       Fax: [phone]   RxID:   [id number] CIPRO 500 MG TABS (CIPROFLOXACIN HCL) 1 by mouth two times a day (uses when traveling out of country)  #20 x 3   Entered by:   Lamar Sprinkles, CMA   Authorized by:   Tresa Garter MD   Signed by:   Lamar Sprinkles, CMA on 04/05/2010   Method used:   Electronically to  [facility] ([facility]       [address]       [place], [place]  [address]       Ph: [phone] or [phone]       Fax: [phone]   RxID:   [id number]

## 2010-08-01 NOTE — Progress Notes (Signed)
Summary: added tp existing order.  ---- Converted from flag ---- ---- 08/22/2009 3:36 PM, Jacques Navy MD wrote: please add a uric acid level to up-coming lab work. 719.43 ------------------------------

## 2010-08-01 NOTE — Progress Notes (Signed)
Summary: medical tags   Phone Note Call from Patient Call back at [phone]   Caller: Spouse Reason for Call: Talk to Doctor Summary of Call: request call back from nurse... needs to know what she should put on medical tags Initial call taken by: Migdalia Dk,  July 04, 2009 3:25 PM  Follow-up for Phone Call        spoke w/wife Meredith Staggers, RN  July 05, 2009 12:51 PM

## 2010-08-01 NOTE — Progress Notes (Signed)
Summary: Refill--Cipro  Phone Note Refill Request Message from:  Fax from Pharmacy on December 23, 2009 8:48 AM  Refills Requested: Medication #1:  CIPRO 500 MG TABS 1 by mouth two times a day (uses when traveling out of country) Next Appointment Scheduled: 01-03-10 Initial call taken by: Lucious Groves,  December 23, 2009 8:48 AM  Follow-up for Phone Call        ok to ref Follow-up by: Tresa Garter MD,  December 23, 2009 12:54 PM    Prescriptions: CIPRO 500 MG TABS (CIPROFLOXACIN HCL) 1 by mouth two times a day (uses when traveling out of country)  #20 Tablet x 0   Entered by:   Lucious Groves   Authorized by:   Tresa Garter MD   Signed by:   Lucious Groves on 12/23/2009   Method used:   Electronically to        [facility] [address] (retail)       [phone]       [place], [place]  [address]       Ph: [phone]       Fax: [phone]   RxID:   [id number]

## 2010-08-01 NOTE — Letter (Signed)
Summary: Appointment - Reminder 2  [facility], Main Office  [address]   [place], [place] [address]   Phone: [phone]  Fax: [phone]     Nov 16, 2009 MRN: [id number]   [name] [name] [address] [place], [place]  [address]   Dear [name] [name],  Our records indicate that it is time to schedule a follow-up appointment with Dr. Myrtis Ser. It is very important that we reach you to schedule this appointment. We look forward to participating in your health care needs. Please contact us at the number listed above at your earliest convenience to schedule your appointment.  If you are unable to make an appointment at this time, give [place] a call so we can update our records.     Sincerely,    Migdalia Dk [facility] Scheduling Team

## 2010-08-01 NOTE — Assessment & Plan Note (Signed)
Summary: 3 MO ROV /NWS  #   Vital Signs:  Patient profile:   60 year old male Height:      74 inches (187.96 cm) Weight:      227 pounds (103.18 kg) BMI:     29.25 Temp:     97.0 degrees F (36.11 degrees C) oral Pulse rate:   88 / minute Pulse rhythm:   regular Resp:     16 per minute BP sitting:   126 / 70  (left arm) Cuff size:   regular  Vitals Entered By: Lanier Prude, CMA(AAMA) (January 03, 2010 8:06 AM) CC: 3 mo f/u Is Patient Diabetic? Yes Comments pt is not using Androgel.  please remove from list.   Primary Care Jeanee Fabre:  Tresa Garter MD  CC:  3 mo f/u.  History of Present Illness: The patient presents for a follow up of hypertension, diabetes, hyperlipidemia, CAD   Current Medications (verified): 1)  Glucophage Xr 500 Mg  Tb24 (Metformin Hcl) .... 2 Once Daily 2)  Glipizide Xl 5 Mg  Tb24 (Glipizide) .Marland Kitchen.. 1 By Mouth Qam 3)  Vasotec 5 Mg  Tabs (Enalapril Maleate) .... 2 By Mouth Qd 4)  Naproxen 500 Mg  Tabs (Naproxen) .... Two Times A Day As Needed 5)  Wellbutrin Xl 150 Mg  Tb24 (Bupropion Hcl) .Marland Kitchen.. 1 Once Daily 6)  Onetouch Ultra Test   Strp (Glucose Blood) .... Two Times A Day As Directed 7)  Onetouch Lancets   Misc (Lancets) .... Two Times A Day As Directed 8)  Amitiza 24 Mcg Caps (Lubiprostone) .Marland Kitchen.. 1-2 Once Daily As Needed Constipation 9)  Niaspan 500 Mg Cr-Tabs (Niacin (Antihyperlipidemic)) .... Take One Tablet By Mouth At Bedtime . 10)  Vytorin 10-80 Mg Tabs (Ezetimibe-Simvastatin) .... Once Daily 11)  Centrum Silver Ultra Mens  Tabs (Multiple Vitamins-Minerals) .... Once Daily 12)  Levitra 20 Mg Tabs (Vardenafil Hcl) .... 1/2-1 Once Daily As Needed 13)  Cipro 500 Mg Tabs (Ciprofloxacin Hcl) .Marland Kitchen.. 1 By Mouth Two Times A Day (Uses When Traveling Out of Country) 14)  Nitrostat 0.4 Mg Subl (Nitroglycerin) .Marland Kitchen.. 1 Tablet Under Tongue At Onset of Chest Pain; You May Repeat Every 5 Minutes For Up To 3 Doses. 15)  Pennsaid 1.5 % Soln (Diclofenac Sodium) ....  3-5 Gtt On Skin Three Times A Day For Pain 16)  Androgel Pump 1 % Gel (Testosterone) .Marland Kitchen.. 10 G On Skin Qam 17)  Aspirin 325 Mg Tabs (Aspirin) .... Take 1 Tab By Mouth Every Day 18)  Dhea 25 Mg Tabs (Prasterone (Dhea)) .... 2 By Mouth Qd 19)  Omega-3-6-9  Caps (Omega 3-6-9 Fatty Acids) .... Once Daily 20)  Vitamin D3 1000 Unit  Tabs (Cholecalciferol) .Marland Kitchen.. 1 Qd 21)  Advil 200 Mg Tabs (Ibuprofen) .... 2 By Mouth Two Times A Day Prn 22)  Aleve 220 Mg Tabs (Naproxen Sodium) .Marland Kitchen.. 1 By Mouth Two Times A Day Prn 23)  Benadryl 25 Mg Tabs (Diphenhydramine Hcl) .Marland Kitchen.. 1-2 By Mouth Two Times A Day Prn 24)  Tylenol Cold No Drowsiness 30-325-15 Mg Tabs (Pseudoephedrine-Apap-Dm) .... 2 By Mouth Two Times A Day Prn 25)  Centrum  Tabs (Multiple Vitamins-Minerals) .Marland Kitchen.. 1 By Mouth Qd  Allergies (verified): 1)  ! Sulfadiazine (Sulfadiazine) 2)  ! * Ct Contrast Dye  Past History:  Past Medical History: Last updated: 06/17/2009 Diabetes mellitus, type II Hyperlipidemia [place]..prior history of surgery for sleep apnea Microalbuminuria H. simplex Coronary artery disease CABG....October, 2009 EF  60%...echo..04/2008 Aortic  root dilitation..slight...echo..2009 History appendectomy and laparoscopic cholecystectomy Low DHEA, testost 2010  Social History: Last updated: 07/18/2007 Occupation: travels a lot Married Never Smoked Regular exercise-no  Review of Systems       The patient complains of weight gain.  The patient denies fever and chest pain.         Tired  Physical Exam  General:  overweight white male Nose:  WNL Mouth:  Oral mucosa and oropharynx without lesions or exudates.  Teeth in good repair. Neck:  There is no jugular venous distention. There are no carotid bruits. Lungs:  Lungs are clear. Respiratory effort is not labored. Heart:  Cardiac exam reveals an S1 with S2. There are no clicks or significant murmurs. Abdomen:  The abdomen is soft. Msk:  R abd poll longus swollen and  tender Extremities:  No clubbing, cyanosis, edema, or deformity noted with normal full range of motion of all joints.   Neurologic:  No cranial nerve deficits noted. Station and gait are normal. Plantar reflexes are down-going bilaterally. DTRs are symmetrical throughout. Sensory, motor and coordinative functions appear intact. Skin:  Intact without suspicious lesions or rashes Psych:  The patient is oriented to person time and place. Affect is normal.   Impression & Recommendations:  Problem # 1:  CORONARY ARTERY BYPASS GRAFT, HX OF (ICD-V45.81) Assessment Unchanged The labs were reviewed with the patient.  His updated medication list for this problem includes:    Vasotec 5 Mg Tabs (Enalapril maleate) .Marland Kitchen... 2 by mouth qd    Nitrostat 0.4 Mg Subl (Nitroglycerin) .Marland Kitchen... 1 tablet under tongue at onset of chest pain; you may repeat every 5 minutes for up to 3 doses.    Aspirin 325 Mg Tabs (Aspirin) .Marland Kitchen... Take 1 tab by mouth every day  Problem # 2:  HYPOGONADISM (ICD-257.2) Assessment: Comment Only Has notstarted Rx yet; he is planning to  Problem # 3:  FATIGUE (ICD-780.79) Assessment: Unchanged The labs were reviewed with the patient.   Problem # 4:  DIABETES MELLITUS, TYPE II (ICD-250.00) Assessment: Unchanged  His updated medication list for this problem includes:    Glucophage Xr 500 Mg Tb24 (Metformin hcl) .Marland Kitchen... 2 once daily    Glipizide Xl 5 Mg Tb24 (Glipizide) .Marland Kitchen... 1 by mouth qam    Vasotec 5 Mg Tabs (Enalapril maleate) .Marland Kitchen... 2 by mouth qd    Aspirin 325 Mg Tabs (Aspirin) .Marland Kitchen... Take 1 tab by mouth every day  Problem # 5:  HYPERLIPIDEMIA (ICD-272.4) Assessment: Unchanged  His updated medication list for this problem includes:    Niaspan 500 Mg Cr-tabs (Niacin (antihyperlipidemic)) .Marland Kitchen... Take one tablet by mouth at bedtime .    Vytorin 10-80 Mg Tabs (Ezetimibe-simvastatin) ..... Once daily  Labs Reviewed: SGOT: 21 (12/26/2009)   SGPT: 20 (12/26/2009)  Prior 10 Yr Risk  Heart Disease: N/A (06/14/2008)   HDL:40.80 (09/16/2009), 34.80 (05/13/2009)  LDL:70 (09/16/2009), 65 (05/13/2009)  Chol:149 (09/16/2009), 140 (05/13/2009)  Trig:191.0 (09/16/2009), 200.0 (05/13/2009)  Complete Medication List: 1)  Glucophage Xr 500 Mg Tb24 (Metformin hcl) .... 2 once daily 2)  Glipizide Xl 5 Mg Tb24 (Glipizide) .Marland Kitchen.. 1 by mouth qam 3)  Vasotec 5 Mg Tabs (Enalapril maleate) .... 2 by mouth qd 4)  Naproxen 500 Mg Tabs (Naproxen) .... Two times a day as needed 5)  Wellbutrin Xl 150 Mg Tb24 (Bupropion hcl) .Marland Kitchen.. 1 once daily 6)  Onetouch Ultra Test Strp (Glucose blood) .... Two times a day as directed 7)  Onetouch Lancets Misc (Lancets) .Marland KitchenMarland KitchenMarland Kitchen  Two times a day as directed 8)  Amitiza 24 Mcg Caps (Lubiprostone) .Marland Kitchen.. 1-2 once daily as needed constipation 9)  Niaspan 500 Mg Cr-tabs (Niacin (antihyperlipidemic)) .... Take one tablet by mouth at bedtime . 10)  Vytorin 10-80 Mg Tabs (Ezetimibe-simvastatin) .... Once daily 11)  Centrum Silver Ultra Mens Tabs (Multiple vitamins-minerals) .... Once daily 12)  Levitra 20 Mg Tabs (Vardenafil hcl) .... 1/2-1 once daily as needed 13)  Cipro 500 Mg Tabs (Ciprofloxacin hcl) .Marland Kitchen.. 1 by mouth two times a day (uses when traveling out of country) 14)  Nitrostat 0.4 Mg Subl (Nitroglycerin) .Marland Kitchen.. 1 tablet under tongue at onset of chest pain; you may repeat every 5 minutes for up to 3 doses. 15)  Pennsaid 1.5 % Soln (Diclofenac sodium) .... 3-5 gtt on skin three times a day for pain 16)  Androgel Pump 1 % Gel (Testosterone) .Marland Kitchen.. 10 g on skin qam 17)  Aspirin 325 Mg Tabs (Aspirin) .... Take 1 tab by mouth every day 18)  Dhea 25 Mg Tabs (Prasterone (dhea)) .... 2 by mouth qd 19)  Omega-3-6-9 Caps (Omega 3-6-9 fatty acids) .... Once daily 20)  Vitamin D3 1000 Unit Tabs (Cholecalciferol) .Marland Kitchen.. 1 qd 21)  Advil 200 Mg Tabs (Ibuprofen) .... 2 by mouth two times a day prn 22)  Aleve 220 Mg Tabs (Naproxen sodium) .Marland Kitchen.. 1 by mouth two times a day prn 23)  Benadryl 25  Mg Tabs (Diphenhydramine hcl) .Marland Kitchen.. 1-2 by mouth two times a day prn 24)  Tylenol Cold No Drowsiness 30-325-15 Mg Tabs (Pseudoephedrine-apap-dm) .... 2 by mouth two times a day prn 25)  Centrum Tabs (Multiple vitamins-minerals) .Marland Kitchen.. 1 by mouth qd  Patient Instructions: 1)  Please schedule a follow-up appointment in 4 months well w/labs and testost and A1c v70.0  995.20 .

## 2010-08-01 NOTE — Letter (Signed)
Summary: [name] [name] OD  [name] [name] OD   Imported By: Lennie Odor 02/06/2010 09:46:33  _____________________________________________________________________  External Attachment:    Type:   Image     Comment:   External Document

## 2010-08-01 NOTE — Assessment & Plan Note (Signed)
Summary: ROA/JSS---will need all prescriptions given to him at ov/.cd   Vital Signs:  Patient profile:   60 year old male Height:      74 inches Weight:      230 pounds BMI:     29.64 Temp:     98.8 degrees F oral Pulse rate:   88 / minute Pulse rhythm:   regular Resp:     16 per minute BP sitting:   116 / 86  (left arm) Cuff size:   regular  Vitals Entered By: Lanier Prude, Beverly Gust) (May 24, 2010 9:18 AM) CC: CPX Is Patient Diabetic? Yes   Primary Care Provider:  Tresa Garter MD  CC:  CPX.  History of Present Illness: The patient presents for a preventive health examination   Current Medications (verified): 1)  Glucophage Xr 500 Mg  Tb24 (Metformin Hcl) .... 2 Once Daily 2)  Glipizide Xl 5 Mg  Tb24 (Glipizide) .Marland Kitchen.. 1 By Mouth Qam 3)  Vasotec 5 Mg  Tabs (Enalapril Maleate) .... 2 By Mouth Qd 4)  Naproxen 500 Mg  Tabs (Naproxen) .... Two Times A Day As Needed 5)  Wellbutrin Xl 150 Mg  Tb24 (Bupropion Hcl) .Marland Kitchen.. 1 Once Daily 6)  Onetouch Ultra Test   Strp (Glucose Blood) .... Two Times A Day As Directed 7)  Onetouch Lancets   Misc (Lancets) .... Two Times A Day As Directed 8)  Amitiza 24 Mcg Caps (Lubiprostone) .Marland Kitchen.. 1-2 Once Daily As Needed Constipation 9)  Niaspan 500 Mg Cr-Tabs (Niacin (Antihyperlipidemic)) .... Take One Tablet By Mouth At Bedtime . 10)  Vytorin 10-80 Mg Tabs (Ezetimibe-Simvastatin) .... Once Daily 11)  Centrum Silver Ultra Mens  Tabs (Multiple Vitamins-Minerals) .... Once Daily 12)  Levitra 20 Mg Tabs (Vardenafil Hcl) .... 1/2-1 Once Daily As Needed 13)  Cipro 500 Mg Tabs (Ciprofloxacin Hcl) .Marland Kitchen.. 1 By Mouth Two Times A Day (Uses When Traveling Out of Country) 14)  Nitrostat 0.4 Mg Subl (Nitroglycerin) .Marland Kitchen.. 1 Tablet Under Tongue At Onset of Chest Pain; You May Repeat Every 5 Minutes For Up To 3 Doses. 15)  Pennsaid 1.5 % Soln (Diclofenac Sodium) .... 3-5 Gtt On Skin Three Times A Day For Pain 16)  Androgel Pump 1 % Gel (Testosterone) .Marland Kitchen.. 10  G On Skin Qam 17)  Aspirin 325 Mg Tabs (Aspirin) .... Take 1 Tab By Mouth Every Day 18)  Dhea 25 Mg Tabs (Prasterone (Dhea)) .... 2 By Mouth Qd 19)  Omega-3-6-9  Caps (Omega 3-6-9 Fatty Acids) .... Once Daily 20)  Vitamin D3 1000 Unit  Tabs (Cholecalciferol) .Marland Kitchen.. 1 Qd 21)  Advil 200 Mg Tabs (Ibuprofen) .... 2 By Mouth Two Times A Day Prn 22)  Aleve 220 Mg Tabs (Naproxen Sodium) .Marland Kitchen.. 1 By Mouth Two Times A Day Prn 23)  Benadryl 25 Mg Tabs (Diphenhydramine Hcl) .Marland Kitchen.. 1-2 By Mouth Two Times A Day Prn 24)  Tylenol Cold No Drowsiness 30-325-15 Mg Tabs (Pseudoephedrine-Apap-Dm) .... 2 By Mouth Two Times A Day Prn 25)  Centrum  Tabs (Multiple Vitamins-Minerals) .Marland Kitchen.. 1 By Mouth Qd 26)  Valtrex 500 Mg Tabs (Valacyclovir Hcl) .Marland Kitchen.. 1 By Mouth Three Times A Day X 7 D As Needed Herpes 27)  Promethazine Hcl 25 Mg Tabs (Promethazine Hcl) .Marland Kitchen.. 1-2 By Mouth Four Times A Day As Needed Nausea  Allergies (verified): 1)  ! Sulfadiazine (Sulfadiazine) 2)  ! * Ct Contrast Dye  Past History:  Past Medical History: Last updated: 01/11/2010 Diabetes mellitus, type II Hyperlipidemia [place]..prior  history of surgery for sleep apnea Microalbuminuria H. simplex CAD....nuclear... July 04, 2009... no ischemia CABG....October, 2009 EF  60%...echo..04/2008 Aortic root dilitation..slight...echo..2009 History appendectomy and laparoscopic cholecystectomy Low DHEA, testost 2010  Social History: Last updated: 07/18/2007 Occupation: travels a lot Married Never Smoked Regular exercise-no  Review of Systems  The patient denies anorexia, fever, weight loss, weight gain, vision loss, decreased hearing, hoarseness, chest pain, syncope, dyspnea on exertion, peripheral edema, prolonged cough, headaches, hemoptysis, abdominal pain, melena, hematochezia, severe indigestion/heartburn, hematuria, incontinence, genital sores, muscle weakness, suspicious skin lesions, transient blindness, difficulty walking, depression,  unusual weight change, abnormal bleeding, enlarged lymph nodes, angioedema, and testicular masses.    Physical Exam  General:  overweight white male Head:  Normocephalic and atraumatic without obvious abnormalities. No apparent alopecia or balding. Eyes:  No corneal or conjunctival inflammation noted. EOMI. Perrla. Funduscopic exam benign, without hemorrhages, exudates or papilledema. Vision grossly normal. Ears:  External ear exam shows no significant lesions or deformities.  Otoscopic examination reveals clear canals, tympanic membranes are intact bilaterally without bulging, retraction, inflammation or discharge. Hearing is grossly normal bilaterally. Nose:  WNL Mouth:  Oral mucosa and oropharynx without lesions or exudates.  Teeth in good repair. Neck:  No deformities, masses, or tenderness noted. Lungs:  Normal respiratory effort, chest expands symmetrically. Lungs are clear to auscultation, no crackles or wheezes. Heart:  Normal rate and regular rhythm. S1 and S2 normal without gallop, murmur, click, rub or other extra sounds. Abdomen:  Bowel sounds positive,abdomen soft and non-tender without masses, organomegaly or hernias noted. Msk:  No deformity or scoliosis noted of thoracic or lumbar spine.   Neurologic:  No cranial nerve deficits noted. Station and gait are normal. Plantar reflexes are down-going bilaterally. DTRs are symmetrical throughout. Sensory, motor and coordinative functions appear intact.   Impression & Recommendations:  Problem # 1:  HEALTH MAINTENANCE EXAM (ICD-V70.0) Assessment New Health and age related issues were discussed. Available screening tests and vaccinations were discussed as well. Healthy life style including good diet and exercise was discussed.  The labs were reviewed with the patient.   Problem # 2:  Travel to [place] Assessment: New see vaccines and meds Typhoid vacc at Health Dept  Problem # 3:  CORONARY ARTERY DISEASE (ICD-414.00) Assessment:  Unchanged  The following medications were removed from the medication list:    Nitrostat 0.4 Mg Subl (Nitroglycerin) .Marland Kitchen... 1 tablet under tongue at onset of chest pain; you may repeat every 5 minutes for up to 3 doses. His updated medication list for this problem includes:    Vasotec 5 Mg Tabs (Enalapril maleate) .Marland Kitchen... 2 by mouth qd    Aspirin 325 Mg Tabs (Aspirin) .Marland Kitchen... Take 1 tab by mouth every day    Nitrolingual 0.4 Mg/spray Soln (Nitroglycerin) .Marland Kitchen... As dirr  Problem # 4:  HYPOGONADISM (ICD-257.2) Assessment: Unchanged Discussed - OK to start Androgel  Complete Medication List: 1)  Metformin Hcl 1000 Mg Tabs (Metformin hcl) .Marland Kitchen.. 1 by mouth two times a day for diabetes 2)  Glipizide Xl 5 Mg Tb24 (Glipizide) .Marland Kitchen.. 1 by mouth qam 3)  Vasotec 5 Mg Tabs (Enalapril maleate) .... 2 by mouth qd 4)  Naproxen 500 Mg Tabs (Naproxen) .... Two times a day as needed 5)  Wellbutrin Xl 150 Mg Tb24 (Bupropion hcl) .Marland Kitchen.. 1 once daily 6)  Onetouch Ultra Test Strp (Glucose blood) .... Two times a day as directed 7)  Onetouch Lancets Misc (Lancets) .... Two times a day as directed 8)  Amitiza  24 Mcg Caps (Lubiprostone) .Marland Kitchen.. 1-2 once daily as needed constipation 9)  Niaspan 500 Mg Cr-tabs (Niacin (antihyperlipidemic)) .... Take one tablet by mouth at bedtime . 10)  Vytorin 10-80 Mg Tabs (Ezetimibe-simvastatin) .... Once daily 11)  Levitra 20 Mg Tabs (Vardenafil hcl) .... 1/2-1 once daily as needed 12)  Cipro 500 Mg Tabs (Ciprofloxacin hcl) .Marland Kitchen.. 1 by mouth two times a day (uses when traveling out of country) 13)  Pennsaid 1.5 % Soln (Diclofenac sodium) .... 3-5 gtt on skin three times a day for pain 14)  Androgel Pump 1 % Gel (Testosterone) .Marland Kitchen.. 10 g on skin qam 15)  Aspirin 325 Mg Tabs (Aspirin) .... Take 1 tab by mouth every day 16)  Omega-3-6-9 Caps (Omega 3-6-9 fatty acids) .... Once daily 17)  Vitamin D3 1000 Unit Tabs (Cholecalciferol) .Marland Kitchen.. 1 qd 18)  Advil 200 Mg Tabs (Ibuprofen) .... 2 by mouth two  times a day prn 19)  Benadryl 25 Mg Tabs (Diphenhydramine hcl) .Marland Kitchen.. 1-2 by mouth two times a day prn 20)  Tylenol Cold No Drowsiness 30-325-15 Mg Tabs (Pseudoephedrine-apap-dm) .... 2 by mouth two times a day prn 21)  Valtrex 500 Mg Tabs (Valacyclovir hcl) .Marland Kitchen.. 1 by mouth three times a day x 7 d as needed herpes 22)  Promethazine Hcl 25 Mg Tabs (Promethazine hcl) .Marland Kitchen.. 1-2 by mouth four times a day as needed nausea 23)  Nitrolingual 0.4 Mg/spray Soln (Nitroglycerin) .... As dirr 24)  Malarone 250-100 Mg Tabs (Atovaquone-proguanil hcl) .Marland Kitchen.. 1 by mouth once daily  start 1 day prior to departure, continue daily while in the country and for 7 days after arrival  Other Orders: Tdap => 53yrs IM (16109) Menactra IM (60454) Admin 1st Vaccine (09811) Admin of Any Addtl Vaccine (91478)  Patient Instructions: 1)  Please schedule a follow-up appointment in 4 months. 2)  BMP prior to visit, ICD-9: 3)  Hepatic Panel prior to visit, ICD-9: 4)  Lipid Panel prior to visit, ICD-9: 5)  CBC w/ Diff prior to visit, ICD-9: 6)  HbgA1C prior to visit, ICD-9: 7)  testost 250.00  995.20 Prescriptions: MALARONE 250-100 MG TABS (ATOVAQUONE-PROGUANIL HCL) 1 by mouth once daily  start 1 day prior to departure, continue daily while in the country and for 7 days after arrival  #15 x 1   Entered and Authorized by:   Tresa Garter MD   Signed by:   Tresa Garter MD on 05/24/2010   Method used:   Print then Give to Patient   RxID:   [id number] NITROLINGUAL 0.4 MG/SPRAY SOLN (NITROGLYCERIN) as dirr  #1 x 11   Entered and Authorized by:   Tresa Garter MD   Signed by:   Tresa Garter MD on 05/24/2010   Method used:   Print then Give to Patient   RxID:   [id number] PROMETHAZINE HCL 25 MG TABS (PROMETHAZINE HCL) 1-2 by mouth four times a day as needed nausea  #60 x 1   Entered and Authorized by:   Tresa Garter MD   Signed by:   Tresa Garter MD on 05/24/2010   Method used:    Print then Give to Patient   RxID:   [id number] VALTREX 500 MG TABS (VALACYCLOVIR HCL) 1 by mouth three times a day x 7 d as needed herpes  #21 x 2   Entered and Authorized by:   Tresa Garter MD   Signed by:   Tresa Garter MD on 05/24/2010  Method used:   Print then Give to Patient   RxID:   [id number] TYLENOL COLD NO DROWSINESS 30-325-15 MG TABS (PSEUDOEPHEDRINE-APAP-DM) 2 by mouth two times a day prn  #60 x 1   Entered and Authorized by:   Tresa Garter MD   Signed by:   Tresa Garter MD on 05/24/2010   Method used:   Print then Give to Patient   RxID:   [id number] BENADRYL 25 MG TABS (DIPHENHYDRAMINE HCL) 1-2 by mouth two times a day prn  #100 x 3   Entered and Authorized by:   Tresa Garter MD   Signed by:   Tresa Garter MD on 05/24/2010   Method used:   Print then Give to Patient   RxID:   [id number] ADVIL 200 MG TABS (IBUPROFEN) 2 by mouth two times a day prn  #100 x 3   Entered and Authorized by:   Tresa Garter MD   Signed by:   Tresa Garter MD on 05/24/2010   Method used:   Print then Give to Patient   RxID:   [id number] CIPRO 500 MG TABS (CIPROFLOXACIN HCL) 1 by mouth two times a day (uses when traveling out of country)  #20 x 1   Entered and Authorized by:   Tresa Garter MD   Signed by:   Tresa Garter MD on 05/24/2010   Method used:   Print then Give to Patient   RxID:   [id number] LEVITRA 20 MG TABS (VARDENAFIL HCL) 1/2-1 once daily as needed  #12 x 12   Entered and Authorized by:   Tresa Garter MD   Signed by:   Tresa Garter MD on 05/24/2010   Method used:   Print then Give to Patient   RxID:   [id number] VYTORIN 10-80 MG TABS (EZETIMIBE-SIMVASTATIN) once daily  #30 x 12   Entered and Authorized by:   Tresa Garter MD   Signed by:   Tresa Garter MD on 05/24/2010   Method used:   Print then Give to Patient   RxID:    [id number] NIASPAN 500 MG CR-TABS (NIACIN (ANTIHYPERLIPIDEMIC)) Take one tablet by mouth at bedtime .  #30 x 12   Entered and Authorized by:   Tresa Garter MD   Signed by:   Tresa Garter MD on 05/24/2010   Method used:   Print then Give to Patient   RxID:   [id number] AMITIZA 24 MCG CAPS (LUBIPROSTONE) 1-2 once daily as needed constipation  #60 x 12   Entered and Authorized by:   Tresa Garter MD   Signed by:   Tresa Garter MD on 05/24/2010   Method used:   Print then Give to Patient   RxID:   [id number] ONETOUCH LANCETS   MISC (LANCETS) two times a day as directed  #100 x 12   Entered and Authorized by:   Tresa Garter MD   Signed by:   Tresa Garter MD on 05/24/2010   Method used:   Print then Give to Patient   RxID:   [id number] ONETOUCH ULTRA TEST   STRP (GLUCOSE BLOOD) two times a day as directed  #100 x 12   Entered and Authorized by:   Tresa Garter MD   Signed by:   Tresa Garter MD on 05/24/2010   Method used:   Print then Give to Patient   RxID:   [id number] WELLBUTRIN XL 150  MG  TB24 (BUPROPION HCL) 1 once daily  #30 x 12   Entered and Authorized by:   Tresa Garter MD   Signed by:   Tresa Garter MD on 05/24/2010   Method used:   Print then Give to Patient   RxID:   [id number] VASOTEC 5 MG  TABS (ENALAPRIL MALEATE) 2 by mouth qd  #60 x 12   Entered and Authorized by:   Tresa Garter MD   Signed by:   Tresa Garter MD on 05/24/2010   Method used:   Print then Give to Patient   RxID:   [id number] GLIPIZIDE XL 5 MG  TB24 (GLIPIZIDE) 1 by mouth qam  #30 x 12   Entered and Authorized by:   Tresa Garter MD   Signed by:   Tresa Garter MD on 05/24/2010   Method used:   Print then Give to Patient   RxID:   [id number] METFORMIN HCL 1000 MG TABS (METFORMIN HCL) 1 by mouth two times a day for diabetes  #60 x 12   Entered and Authorized by:    Tresa Garter MD   Signed by:   Tresa Garter MD on 05/24/2010   Method used:   Print then Give to Patient   RxID:   [id number]    Orders Added: 1)  Tdap => 28yrs IM [42595] 2)  Menactra IM [63875] 3)  Admin 1st Vaccine [90471] 4)  Admin of Any Addtl Vaccine [90472] 5)  Est. Patient 40-64 years [64332]   Immunization History:  Influenza Immunization History:    Influenza:  historical (03/30/2010)  Hepatitis A Immunization History:    Hepatitis A # 1:  historical (05/17/1999)  Immunizations Administered:  Tetanus Vaccine:    Vaccine Type: Tdap    Site: left deltoid    Mfr: GlaxoSmithKline    Dose: 0.5 ml    Route: IM    Given by: Lanier Prude, CMA(AAMA)    Exp. Date: 04/20/2012    Lot #: [id number]    VIS given: 05/19/08 version given May 24, 2010.  Meningococcal Vaccine:    Vaccine Type: Menactra    Site: right deltoid    Mfr: Sanofi Pasteur    Dose: 0.5 ml    Route: IM    Given by: Lanier Prude, CMA(AAMA)    Exp. Date: 01/11/2012    Lot #: [id number]    VIS given: 07/29/06 version given May 24, 2010.   Immunization History:  Influenza Immunization History:    Influenza:  Historical (03/30/2010)  Hepatitis A Immunization History:    Hepatitis A # 1:  Historical (05/17/1999)  Immunizations Administered:  Tetanus Vaccine:    Vaccine Type: Tdap    Site: left deltoid    Mfr: GlaxoSmithKline    Dose: 0.5 ml    Route: IM    Given by: Lanier Prude, CMA(AAMA)    Exp. Date: 04/20/2012    Lot #: [id number]    VIS given: 05/19/08 version given May 24, 2010.  Meningococcal Vaccine:    Vaccine Type: Menactra    Site: right deltoid    Mfr: Sanofi Pasteur    Dose: 0.5 ml    Route: IM    Given by: Lanier Prude, CMA(AAMA)    Exp. Date: 01/11/2012    Lot #: [id number]    VIS given: 07/29/06 version given May 24, 2010.

## 2010-08-01 NOTE — Assessment & Plan Note (Signed)
Summary: 4 MO ROV /NWS  #   Vital Signs:  Patient profile:   59 year old male Weight:      223 pounds Temp:     98.9 degrees F oral Pulse rate:   76 / minute BP sitting:   110 / 74  (left arm)  Vitals Entered By: Tora Perches (September 23, 2009 7:55 AM) CC: f/u Is Patient Diabetic? Yes   Primary Care [place] [facility]:  Tresa Garter MD  CC:  f/u.  History of Present Illness: The patient presents for a follow up of hypertension, diabetes, hyperlipidemia, hypogon C/o R wrist pain - radial side since 2/14 - not better  Preventive Screening-Counseling & Management  Alcohol-Tobacco     Smoking Status: never  Current Medications (verified): 1)  Glucophage Xr 500 Mg  Tb24 (Metformin Hcl) .... 2 Once Daily 2)  Glipizide Xl 5 Mg  Tb24 (Glipizide) .Marland Kitchen.. 1 By Mouth Qam 3)  Vasotec 5 Mg  Tabs (Enalapril Maleate) .... 2 By Mouth Qd 4)  Naproxen 500 Mg  Tabs (Naproxen) .... Two Times A Day As Needed 5)  Vitamin D3 1000 Unit  Tabs (Cholecalciferol) .Marland Kitchen.. 1 Qd 6)  Wellbutrin Xl 150 Mg  Tb24 (Bupropion Hcl) .Marland Kitchen.. 1 Once Daily 7)  Onetouch Ultra Test   Strp (Glucose Blood) .... Two Times A Day As Directed 8)  Onetouch Lancets   Misc (Lancets) .... Two Times A Day As Directed 9)  Amitiza 24 Mcg Caps (Lubiprostone) .Marland Kitchen.. 1-2 Once Daily As Needed Constipation 10)  Aspirin 325 Mg Tabs (Aspirin) .... Take 1 Tab By Mouth Every Day 11)  Niaspan 500 Mg Cr-Tabs (Niacin (Antihyperlipidemic)) .... Take One Tablet By Mouth At Bedtime . 12)  Omega-3-6-9  Caps (Omega 3-6-9 Fatty Acids) .... Once Daily 13)  Vytorin 10-80 Mg Tabs (Ezetimibe-Simvastatin) .... Once Daily 14)  Centrum Silver Ultra Mens  Tabs (Multiple Vitamins-Minerals) .... Once Daily 15)  Levitra 20 Mg Tabs (Vardenafil Hcl) .... 1/2-1 Once Daily As Needed 16)  Dhea 25 Mg Tabs (Prasterone (Dhea)) .... 2 By Mouth Qd 17)  Cipro 500 Mg Tabs (Ciprofloxacin Hcl) .Marland Kitchen.. 1 By Mouth Two Times A Day (Uses When Traveling Out of Country) 18)  Nitrostat 0.4 Mg  Subl (Nitroglycerin) .Marland Kitchen.. 1 Tablet Under Tongue At Onset of Chest Pain; You May Repeat Every 5 Minutes For Up To 3 Doses. 19)  Calcium Carbonate-Vitamin D 600-400 Mg-Unit  Tabs (Calcium Carbonate-Vitamin D) .... Once Daily  Allergies: 1)  ! Sulfadiazine (Sulfadiazine) 2)  ! * Ct Contrast Dye  Family History: Reviewed history from 07/18/2007 and no changes required. Family History Hypertension  Social History: Reviewed history from 07/18/2007 and no changes required. Occupation: travels a lot Married Never Smoked Regular exercise-no  Physical Exam  General:  overweight white male Eyes:  No corneal or conjunctival inflammation noted. EOMI. Perrla Ears:  External ear exam shows no significant lesions or deformities.   Nose:  WNL Mouth:  Oral mucosa and oropharynx without lesions or exudates.  Teeth in good repair. Lungs:  Lungs are clear. Respiratory effort is not labored. Heart:  Cardiac exam reveals an S1 with S2. There are no clicks or significant murmurs. Abdomen:  The abdomen is soft. Msk:  R abd poll longus swollen and tender Extremities:  No clubbing, cyanosis, edema, or deformity noted with normal full range of motion of all joints.   Neurologic:  No cranial nerve deficits noted. Station and gait are normal. Plantar reflexes are down-going bilaterally. DTRs are symmetrical  throughout. Sensory, motor and coordinative functions appear intact. Skin:  Intact without suspicious lesions or rashes Psych:  The patient is oriented to person time and place. Affect is normal.   Impression & Recommendations:  Problem # 1:  DE QUERVAIN'S TENOSYNOVITIS (ICD-727.04) R Assessment Unchanged Pennsaid. Inject if not better  Problem # 2:  HYPOGONADISM (ICD-257.2) See "Patient Instructions". Options discussed Risks vs benefits and controversies of a long term testost use were discussed.  Problem # 3:  CORONARY ARTERY DISEASE (ICD-414.00) Assessment: Unchanged  His updated medication  list for this problem includes:    Vasotec 5 Mg Tabs (Enalapril maleate) .Marland Kitchen... 2 by mouth qd    Nitrostat 0.4 Mg Subl (Nitroglycerin) .Marland Kitchen... 1 tablet under tongue at onset of chest pain; you may repeat every 5 minutes for up to 3 doses.    Aspirin 325 Mg Tabs (Aspirin) .Marland Kitchen... Take 1 tab by mouth every day  Problem # 4:  DIABETES MELLITUS, TYPE II (ICD-250.00) Assessment: Unchanged  His updated medication list for this problem includes:    Glucophage Xr 500 Mg Tb24 (Metformin hcl) .Marland Kitchen... 2 once daily    Glipizide Xl 5 Mg Tb24 (Glipizide) .Marland Kitchen... 1 by mouth qam    Vasotec 5 Mg Tabs (Enalapril maleate) .Marland Kitchen... 2 by mouth qd    Aspirin 325 Mg Tabs (Aspirin) .Marland Kitchen... Take 1 tab by mouth every day  Problem # 5:  FATIGUE (ICD-780.79) Assessment: Unchanged  Complete Medication List: 1)  Glucophage Xr 500 Mg Tb24 (Metformin hcl) .... 2 once daily 2)  Glipizide Xl 5 Mg Tb24 (Glipizide) .Marland Kitchen.. 1 by mouth qam 3)  Vasotec 5 Mg Tabs (Enalapril maleate) .... 2 by mouth qd 4)  Naproxen 500 Mg Tabs (Naproxen) .... Two times a day as needed 5)  Wellbutrin Xl 150 Mg Tb24 (Bupropion hcl) .Marland Kitchen.. 1 once daily 6)  Onetouch Ultra Test Strp (Glucose blood) .... Two times a day as directed 7)  Onetouch Lancets Misc (Lancets) .... Two times a day as directed 8)  Amitiza 24 Mcg Caps (Lubiprostone) .Marland Kitchen.. 1-2 once daily as needed constipation 9)  Niaspan 500 Mg Cr-tabs (Niacin (antihyperlipidemic)) .... Take one tablet by mouth at bedtime . 10)  Vytorin 10-80 Mg Tabs (Ezetimibe-simvastatin) .... Once daily 11)  Centrum Silver Ultra Mens Tabs (Multiple vitamins-minerals) .... Once daily 12)  Levitra 20 Mg Tabs (Vardenafil hcl) .... 1/2-1 once daily as needed 13)  Cipro 500 Mg Tabs (Ciprofloxacin hcl) .Marland Kitchen.. 1 by mouth two times a day (uses when traveling out of country) 14)  Nitrostat 0.4 Mg Subl (Nitroglycerin) .Marland Kitchen.. 1 tablet under tongue at onset of chest pain; you may repeat every 5 minutes for up to 3 doses. 15)  Pennsaid 1.5 %  Soln (Diclofenac sodium) .... 3-5 gtt on skin three times a day for pain 16)  Androgel Pump 1 % Gel (Testosterone) .Marland Kitchen.. 10 g on skin qam 17)  Aspirin 325 Mg Tabs (Aspirin) .... Take 1 tab by mouth every day 18)  Dhea 25 Mg Tabs (Prasterone (dhea)) .... 2 by mouth qd 19)  Omega-3-6-9 Caps (Omega 3-6-9 fatty acids) .... Once daily 20)  Vitamin D3 1000 Unit Tabs (Cholecalciferol) .Marland Kitchen.. 1 qd 21)  Advil 200 Mg Tabs (Ibuprofen) .... 2 by mouth two times a day prn 22)  Aleve 220 Mg Tabs (Naproxen sodium) .Marland Kitchen.. 1 by mouth two times a day prn 23)  Benadryl 25 Mg Tabs (Diphenhydramine hcl) .Marland Kitchen.. 1-2 by mouth two times a day prn 24)  Tylenol Cold No Drowsiness  30-325-15 Mg Tabs (Pseudoephedrine-apap-dm) .... 2 by mouth two times a day prn 25)  Centrum Tabs (Multiple vitamins-minerals) .Marland Kitchen.. 1 by mouth qd  Patient Instructions: 1)  Please schedule a follow-up appointment in 3 months. 2)  BMP prior to visit, ICD-9: 3)  Hepatic Panel prior to visit, ICD-9: 4)  HbgA1C prior to visit, ICD-9: 5)  Testost 995.20 250.00 6)  Ice to wrist 7)  ACE wrap with activities Prescriptions: CENTRUM  TABS (MULTIPLE VITAMINS-MINERALS) 1 by mouth qd  #100 x 3   Entered and Authorized by:   Tresa Garter MD   Signed by:   Tresa Garter MD on 10/03/2009   Method used:   Print then Give to Patient   RxID:   [id number] TYLENOL COLD NO DROWSINESS 30-325-15 MG TABS (PSEUDOEPHEDRINE-APAP-DM) 2 by mouth two times a day prn  #60 x 1   Entered and Authorized by:   Tresa Garter MD   Signed by:   Tresa Garter MD on 10/03/2009   Method used:   Print then Give to Patient   RxID:   [id number] VITAMIN D3 1000 UNIT  TABS (CHOLECALCIFEROL) 1 qd  #100 x 3   Entered and Authorized by:   Tresa Garter MD   Signed by:   Tresa Garter MD on 10/03/2009   Method used:   Print then Give to Patient   RxID:   [id number] OMEGA-3-6-9  CAPS (OMEGA 3-6-9 FATTY ACIDS) once daily  #100 x 3    Entered and Authorized by:   Tresa Garter MD   Signed by:   Tresa Garter MD on 10/03/2009   Method used:   Print then Give to Patient   RxID:   [id number] DHEA 25 MG TABS (PRASTERONE (DHEA)) 2 by mouth qd  #100 x 3   Entered and Authorized by:   Tresa Garter MD   Signed by:   Tresa Garter MD on 10/03/2009   Method used:   Print then Give to Patient   RxID:   [id number] ASPIRIN 325 MG TABS (ASPIRIN) Take 1 tab by mouth every day  #100 x 3   Entered and Authorized by:   Tresa Garter MD   Signed by:   Tresa Garter MD on 10/03/2009   Method used:   Print then Give to Patient   RxID:   [id number] BENADRYL 25 MG TABS (DIPHENHYDRAMINE HCL) 1-2 by mouth two times a day prn  #100 x 3   Entered and Authorized by:   Tresa Garter MD   Signed by:   Tresa Garter MD on 10/03/2009   Method used:   Print then Give to Patient   RxID:   [id number] ALEVE 220 MG TABS (NAPROXEN SODIUM) 1 by mouth two times a day prn  #100 x 3   Entered and Authorized by:   Tresa Garter MD   Signed by:   Tresa Garter MD on 10/03/2009   Method used:   Print then Give to Patient   RxID:   [id number] ADVIL 200 MG TABS (IBUPROFEN) 2 by mouth two times a day prn  #100 x 3   Entered and Authorized by:   Tresa Garter MD   Signed by:   Tresa Garter MD on 10/03/2009   Method used:   Print then Give to Patient   RxID:   [id number] ANDROGEL PUMP 1 % GEL (TESTOSTERONE) 10 g on skin qam  #  1 x 12   Entered and Authorized by:   Tresa Garter MD   Signed by:   Tresa Garter MD on 09/23/2009   Method used:   Print then Give to Patient   RxID:   [id number] PENNSAID 1.5 % SOLN (DICLOFENAC SODIUM) 3-5 gtt on skin three times a day for pain  #1 x 3   Entered and Authorized by:   Tresa Garter MD   Signed by:   Tresa Garter MD on 09/23/2009   Method used:   Print then Give to Patient   RxID:    [id number]

## 2010-08-01 NOTE — Assessment & Plan Note (Signed)
Summary: Cardiology Nuclear Study  Nuclear Med Background Indications for Stress Test: Evaluation for Ischemia, Graft Patency   History: CABG, Echo, Heart Catheterization, Myocardial Perfusion Study  History Comments: 8/09 MPS: EF=73%, (-) ischemia, fixed inf. defect 10/09 Heart Cath: Severe 3V Dz > CABG x3 10/09 Echo: EF= 60%  Symptoms: Chest Pain with Exertion, Chest Tightness, Light-Headedness    Nuclear Pre-Procedure Cardiac Risk Factors: Hypertension, Lipids, NIDDM Caffeine/Decaff Intake: None NPO After: 7:30 PM Lungs: clear IV 0.9% NS with Angio Cath: 20g     IV Site: (R) AC IV Started by: Irean Hong RN Chest Size (in) 42     Height (in): 74 Weight (lb): 214 BMI: 27.58 Tech Comments: FBS=170 at 7:13 am per patient.  Dr. Myrtis Ser consulted about giving this patient a prescription for NTG as needed. A prescription was called into [facility] for him.  Nuclear Med Study 1 or 2 day study:  1 day     Stress Test Type:  Stress Reading MD:  Olga Millers, MD     Referring MD:  J.Katz Resting Radionuclide:  Technetium 61m Tetrofosmin     Resting Radionuclide Dose:  11.0 mCi  Stress Radionuclide:  Technetium 53m Tetrofosmin     Stress Radionuclide Dose:  33.0 mCi   Stress Protocol Exercise Time (min):  9:00 min     Max HR:  142 bpm     Predicted Max HR:  162 bpm  Max Systolic BP: 194 mm Hg     Percent Max HR:  87.65 %     METS: 10.40 Rate Pressure Product:  16109    Stress Test Technologist:  Milana Na EMT-P     Nuclear Technologist:  Harlow Asa CNMT  Rest Procedure  Myocardial perfusion imaging was performed at rest 45 minutes following the intravenous administration of Myoview Technetium 41m Tetrofosmin.  Stress Procedure  The patient exercised for 9:00. The patient stopped due to fatigue and chest tightness.  There were no significant ST-T wave changes occ pvcs: triplet, cuplet, and trigemeny.  Myoview was injected at peak exercise and myocardial perfusion imaging  was performed after a brief delay.  QPS Raw Data Images:  Acuisition technically good; normal left ventricular size. Stress Images:  There is mild decreased uptake in the inferior wall. Rest Images:  There is decreased uptake in the inferior wall. Subtraction (SDS):  No evidence of ischemia. Transient Ischemic Dilatation:  .86  (Normal <1.22)  Lung/Heart Ratio:  .32  (Normal <0.45)  Quantitative Gated Spect Images QGS EDV:  77 ml QGS ESV:  23 ml QGS EF:  70 % QGS cine images:  Normal wall motion   Overall Impression  Exercise Capacity: Good exercise capacity. BP Response: Normal blood pressure response. Clinical Symptoms: There is chest pain ECG Impression: No significant ST segment change suggestive of ischemia; frequent PVCs and one isloated episode of nonsustained VT (3 beats). Overall Impression: There is mild inferior thinning but no sign of scar or ischemia.  Appended Document: Cardiology Nuclear Study Good result  Appended Document: Cardiology Nuclear Study pts wife aware

## 2010-08-01 NOTE — Assessment & Plan Note (Signed)
Summary: per check out/sf  Medications Added CALCIUM CARBONATE-VITAMIN D 600-400 MG-UNIT  TABS (CALCIUM CARBONATE-VITAMIN D) once daily      Allergies Added:   Visit Type:  Follow-up Primary Provider:  Tresa Garter MD  CC:  CAD.  History of Present Illness: Patient is seen for followup of coronary artery disease.  I saw him last June 17, 2009.  At that time we were concerned that he might be having some ischemic symptoms.  Decision was made to proceed with a stress nuclear scan.  This was done July 04, 2009.  Patient exercised well with no EKG changes.  He did have some slight chest tightness both when exercising and when lying under the camera with his arms up.  The nuclear images are completely normal.  I believe that his pain is musculoskeletal.  Current Medications (verified): 1)  Glucophage Xr 500 Mg  Tb24 (Metformin Hcl) .... 2 Once Daily 2)  Glipizide Xl 5 Mg  Tb24 (Glipizide) .Marland Kitchen.. 1 By Mouth Qam 3)  Vasotec 5 Mg  Tabs (Enalapril Maleate) .... 2 By Mouth Qd 4)  Naproxen 500 Mg  Tabs (Naproxen) .... Two Times A Day As Needed 5)  Vitamin D3 1000 Unit  Tabs (Cholecalciferol) .Marland Kitchen.. 1 Qd 6)  Wellbutrin Xl 150 Mg  Tb24 (Bupropion Hcl) .Marland Kitchen.. 1 Once Daily 7)  Onetouch Ultra Test   Strp (Glucose Blood) .... Two Times A Day As Directed 8)  Onetouch Lancets   Misc (Lancets) .... Two Times A Day As Directed 9)  Amitiza 24 Mcg Caps (Lubiprostone) .Marland Kitchen.. 1-2 Once Daily As Needed Constipation 10)  Aspirin 325 Mg Tabs (Aspirin) .... Take 1 Tab By Mouth Every Day 11)  Niaspan 500 Mg Cr-Tabs (Niacin (Antihyperlipidemic)) .... Take One Tablet By Mouth At Bedtime . 12)  Omega-3-6-9  Caps (Omega 3-6-9 Fatty Acids) .... Once Daily 13)  Vytorin 10-80 Mg Tabs (Ezetimibe-Simvastatin) .... Once Daily 14)  Centrum Silver Ultra Mens  Tabs (Multiple Vitamins-Minerals) .... Once Daily 15)  Levitra 20 Mg Tabs (Vardenafil Hcl) .... 1/2-1 Once Daily As Needed 16)  Dhea 25 Mg Tabs (Prasterone (Dhea))  .... 2 By Mouth Qd 17)  Cipro 500 Mg Tabs (Ciprofloxacin Hcl) .Marland Kitchen.. 1 By Mouth Two Times A Day (Uses When Traveling Out of Country) 18)  Nitrostat 0.4 Mg Subl (Nitroglycerin) .Marland Kitchen.. 1 Tablet Under Tongue At Onset of Chest Pain; You May Repeat Every 5 Minutes For Up To 3 Doses. 19)  Calcium Carbonate-Vitamin D 600-400 Mg-Unit  Tabs (Calcium Carbonate-Vitamin D) .... Once Daily  Allergies (verified): 1)  ! Sulfadiazine (Sulfadiazine) 2)  ! * Ct Contrast Dye  Past History:  Past Medical History: Last updated: 06/17/2009 Diabetes mellitus, type II Hyperlipidemia Snorring..prior history of surgery for sleep apnea Microalbuminuria H. simplex Coronary artery disease CABG....October, 2009 EF  60%...echo..04/2008 Aortic root dilitation..slight...echo..2009 History appendectomy and laparoscopic cholecystectomy Low DHEA, testost 2010  Review of Systems       Patient denies fever, chills, headache, sweats, rash, change in vision, change in hearing, shortness of breath, cough, nausea vomiting, urinary symptoms.  All of the systems are reviewed and are negative.  Vital Signs:  Patient profile:   60 year old male Height:      74 inches Weight:      216 pounds BMI:     27.83 Pulse rate:   90 / minute BP sitting:   104 / 66  (left arm) Cuff size:   regular  Vitals Entered By: Hardin Negus, RMA (July 22, 2009 4:17 PM)  Physical Exam  General:  patient is stable. Eyes:  no xanthelasma. Neck:  no jugular venous distention. Lungs:  lungs are clear.  Respiratory effort is nonlabored. Heart:  cardiac exam reveals S1 and S2.  No clicks or significant murmurs. Abdomen:  abdomen is soft. Extremities:  no peripheral edema. Psych:  patient is oriented to person time and place.  Affect is normal.   Impression & Recommendations:  Problem # 1:  CORONARY ARTERY DISEASE (ICD-414.00)  His updated medication list for this problem includes:    Vasotec 5 Mg Tabs (Enalapril maleate) .Marland Kitchen... 2 by  mouth qd    Aspirin 325 Mg Tabs (Aspirin) .Marland Kitchen... Take 1 tab by mouth every day    Nitrostat 0.4 Mg Subl (Nitroglycerin) .Marland Kitchen... 1 tablet under tongue at onset of chest pain; you may repeat every 5 minutes for up to 3 doses.  Coronary disease is stable.  The stress nuclear scan shows no ischemia.  The patient and his wife are reassured.  He is asked if he is allowed to use Levitra.  He is stable for this.  He understands fully that he can not take nitroglycerin within a 2 day period of using this medication.  I made this clear both to the patient and his wife and they understand.  Patient Instructions: 1)  We have given you a prescription for Levitra with 1 refill future refills will need to come from your Primary Care MD 2)  Follow up in 6 months Prescriptions: LEVITRA 20 MG TABS (VARDENAFIL HCL) 1/2-1 once daily as needed  #12 x 1   Entered by:   Meredith Staggers, RN   Authorized by:   Talitha Givens, MD, [facility]   Signed by:   Meredith Staggers, RN on 07/22/2009   Method used:   Print then Give to Patient   RxID:   [id number]

## 2010-08-01 NOTE — Assessment & Plan Note (Signed)
Summary: f33m      Allergies Added: Chest good  Visit Type:  Follow-up Primary Provider:  Tresa Garter MD  CC:  CAD.  History of Present Illness: The patient is seen for follow coronary disease.  He is doing well.  He underwent CABG in 2009.  He had some symptoms in late 2010 and had a nuclear study on July 04, 2009.  Study showed no ischemia.  He's been active.  He has gained weight and we talked about this.  He had 2 very brief episodes of chest discomfort.  These occurred while sitting still and was very localized to his left upper chest.  He did not take nitroglycerin and the symptoms resolved.  Current Medications (verified): 1)  Glucophage Xr 500 Mg  Tb24 (Metformin Hcl) .... 2 Once Daily 2)  Glipizide Xl 5 Mg  Tb24 (Glipizide) .Marland Kitchen.. 1 By Mouth Qam 3)  Vasotec 5 Mg  Tabs (Enalapril Maleate) .... 2 By Mouth Qd 4)  Naproxen 500 Mg  Tabs (Naproxen) .... Two Times A Day As Needed 5)  Wellbutrin Xl 150 Mg  Tb24 (Bupropion Hcl) .Marland Kitchen.. 1 Once Daily 6)  Onetouch Ultra Test   Strp (Glucose Blood) .... Two Times A Day As Directed 7)  Onetouch Lancets   Misc (Lancets) .... Two Times A Day As Directed 8)  Amitiza 24 Mcg Caps (Lubiprostone) .Marland Kitchen.. 1-2 Once Daily As Needed Constipation 9)  Niaspan 500 Mg Cr-Tabs (Niacin (Antihyperlipidemic)) .... Take One Tablet By Mouth At Bedtime . 10)  Vytorin 10-80 Mg Tabs (Ezetimibe-Simvastatin) .... Once Daily 11)  Centrum Silver Ultra Mens  Tabs (Multiple Vitamins-Minerals) .... Once Daily 12)  Levitra 20 Mg Tabs (Vardenafil Hcl) .... 1/2-1 Once Daily As Needed 13)  Cipro 500 Mg Tabs (Ciprofloxacin Hcl) .Marland Kitchen.. 1 By Mouth Two Times A Day (Uses When Traveling Out of Country) 14)  Nitrostat 0.4 Mg Subl (Nitroglycerin) .Marland Kitchen.. 1 Tablet Under Tongue At Onset of Chest Pain; You May Repeat Every 5 Minutes For Up To 3 Doses. 15)  Pennsaid 1.5 % Soln (Diclofenac Sodium) .... 3-5 Gtt On Skin Three Times A Day For Pain 16)  Androgel Pump 1 % Gel (Testosterone) .Marland Kitchen..  10 G On Skin Qam 17)  Aspirin 325 Mg Tabs (Aspirin) .... Take 1 Tab By Mouth Every Day 18)  Dhea 25 Mg Tabs (Prasterone (Dhea)) .... 2 By Mouth Qd 19)  Omega-3-6-9  Caps (Omega 3-6-9 Fatty Acids) .... Once Daily 20)  Vitamin D3 1000 Unit  Tabs (Cholecalciferol) .Marland Kitchen.. 1 Qd 21)  Advil 200 Mg Tabs (Ibuprofen) .... 2 By Mouth Two Times A Day Prn 22)  Aleve 220 Mg Tabs (Naproxen Sodium) .Marland Kitchen.. 1 By Mouth Two Times A Day Prn 23)  Benadryl 25 Mg Tabs (Diphenhydramine Hcl) .Marland Kitchen.. 1-2 By Mouth Two Times A Day Prn 24)  Tylenol Cold No Drowsiness 30-325-15 Mg Tabs (Pseudoephedrine-Apap-Dm) .... 2 By Mouth Two Times A Day Prn 25)  Centrum  Tabs (Multiple Vitamins-Minerals) .Marland Kitchen.. 1 By Mouth Qd  Allergies (verified): 1)  ! Sulfadiazine (Sulfadiazine) 2)  ! * Ct Contrast Dye  Past History:  Past Medical History: Diabetes mellitus, type II Hyperlipidemia Snorring..prior history of surgery for sleep apnea Microalbuminuria H. simplex CAD....nuclear... July 04, 2009... no ischemia CABG....October, 2009 EF  60%...echo..04/2008 Aortic root dilitation..slight...echo..2009 History appendectomy and laparoscopic cholecystectomy Low DHEA, testost 2010  Review of Systems       Patient denies fever, chills, headache, sweats, rash, change in vision, change in hearing, cough, nausea vomiting,  urinary symptoms.  All other systems are reviewed and are negative.  Vital Signs:  Patient profile:   60 year old male Height:      74 inches Pulse rate:   80 / minute BP sitting:   122 / 66  (left arm) Cuff size:   regular  Vitals Entered By: Hardin Negus, RMA (January 11, 2010 3:26 PM)  Physical Exam  General:  patient is stable. Eyes:  no xanthelasma. Neck:  no jugular venous distention. Chest Wall:  no chest wall tenderness. Lungs:  lungs are clear.  Respiratory effort is nonlabored. Heart:  cardiac exam reveals S1-S2.  No clicks or significant murmurs. Abdomen:  abdomen is soft. Extremities:  no  peripheral edema. Psych:  patient is oriented to person time and place.  Affect is normal.   Impression & Recommendations:  Problem # 1:  CORONARY ARTERY DISEASE (ICD-414.00)  His updated medication list for this problem includes:    Vasotec 5 Mg Tabs (Enalapril maleate) .Marland Kitchen... 2 by mouth qd    Nitrostat 0.4 Mg Subl (Nitroglycerin) .Marland Kitchen... 1 tablet under tongue at onset of chest pain; you may repeat every 5 minutes for up to 3 doses.    Aspirin 325 Mg Tabs (Aspirin) .Marland Kitchen... Take 1 tab by mouth every day Coronary disease is stable.  His recent brief episodes of chest pain was noncardiac.  No further workup is needed.  Followup in one year.  Problem # 2:  HYPERTENSION, BENIGN (ICD-401.1)  His updated medication list for this problem includes:    Vasotec 5 Mg Tabs (Enalapril maleate) .Marland Kitchen... 2 by mouth qd    Aspirin 325 Mg Tabs (Aspirin) .Marland Kitchen... Take 1 tab by mouth every day Blood pressure is controlled today. No change in therapy.  Problem # 3:  OVERWEIGHT (ICD-278.02) Patient is overweight.  I've asked him to try to lose 20 pounds by the time I see him next year.  Patient Instructions: 1)  Your physician wants you to follow-up in:  1 year.  You will receive a reminder letter in the mail two months in advance. If you don't receive a letter, please call our office to schedule the follow-up appointment.

## 2010-08-01 NOTE — Progress Notes (Signed)
Summary: LAB WORK   Phone Note Call from Patient Call back at Work Phone [phone]   Caller: Patient Reason for Call: Talk to Nurse Summary of Call: PT HAD LAB WORK DONE BY DR PLOTNIKOV TODAY, DOES DR Myrtis Ser WANT ANY OTHER LAB WORK DONE Initial call taken by: Migdalia Dk,  December 26, 2009 11:33 AM  Follow-up for Phone Call        pt aware we do not need any labwork Meredith Staggers, RN  December 26, 2009 3:34 PM

## 2010-08-01 NOTE — Assessment & Plan Note (Signed)
Summary: wrist pain/[plot/cd   Vital Signs:  Patient profile:   60 year old male Height:      74 inches Weight:      225.75 pounds BMI:     29.09 O2 Sat:      96 % on Room air Temp:     98.3 degrees F oral Pulse rate:   91 / minute BP sitting:   134 / 54  (left arm)  Vitals Entered By: Lucious Groves (August 22, 2009 2:39 PM)  O2 Flow:  Room air CC: C/O right wrist pain x 1 week. Pt denies injury, but has noted increased usage of that side./kb Is Patient Diabetic? Yes Pain Assessment Patient in pain? yes     Location: wrist Intensity: 4 Type: aching Onset of pain  Previously was 9 of 10   Primary Care Provider:  Georgina Quint Plotnikov MD  CC:  C/O right wrist pain x 1 week. Pt denies injury and but has noted increased usage of that side./kb.  History of Present Illness: Friday the 11th he had been shoveling gravel. Saturday more work. Sunday many hours of playing the piano. by Monday started to have pain. By Monday evening the wrist was red and swollen and very painful. NO relief with ibuprofen 600mg . Continue taking 600mg  three times a day. He does report a history of toe joint inflammation. No formal diagnosis of gout has ever been made. chart reviewed and there is no prior Uric Acid level.   Current Medications (verified): 1)  Glucophage Xr 500 Mg  Tb24 (Metformin Hcl) .... 2 Once Daily 2)  Glipizide Xl 5 Mg  Tb24 (Glipizide) .Marland Kitchen.. 1 By Mouth Qam 3)  Vasotec 5 Mg  Tabs (Enalapril Maleate) .... 2 By Mouth Qd 4)  Naproxen 500 Mg  Tabs (Naproxen) .... Two Times A Day As Needed 5)  Vitamin D3 1000 Unit  Tabs (Cholecalciferol) .Marland Kitchen.. 1 Qd 6)  Wellbutrin Xl 150 Mg  Tb24 (Bupropion Hcl) .Marland Kitchen.. 1 Once Daily 7)  Onetouch Ultra Test   Strp (Glucose Blood) .... Two Times A Day As Directed 8)  Onetouch Lancets   Misc (Lancets) .... Two Times A Day As Directed 9)  Amitiza 24 Mcg Caps (Lubiprostone) .Marland Kitchen.. 1-2 Once Daily As Needed Constipation 10)  Aspirin 325 Mg Tabs (Aspirin) .... Take 1  Tab By Mouth Every Day 11)  Niaspan 500 Mg Cr-Tabs (Niacin (Antihyperlipidemic)) .... Take One Tablet By Mouth At Bedtime . 12)  Omega-3-6-9  Caps (Omega 3-6-9 Fatty Acids) .... Once Daily 13)  Vytorin 10-80 Mg Tabs (Ezetimibe-Simvastatin) .... Once Daily 14)  Centrum Silver Ultra Mens  Tabs (Multiple Vitamins-Minerals) .... Once Daily 15)  Levitra 20 Mg Tabs (Vardenafil Hcl) .... 1/2-1 Once Daily As Needed 16)  Dhea 25 Mg Tabs (Prasterone (Dhea)) .... 2 By Mouth Qd 17)  Cipro 500 Mg Tabs (Ciprofloxacin Hcl) .Marland Kitchen.. 1 By Mouth Two Times A Day (Uses When Traveling Out of Country) 18)  Nitrostat 0.4 Mg Subl (Nitroglycerin) .Marland Kitchen.. 1 Tablet Under Tongue At Onset of Chest Pain; You May Repeat Every 5 Minutes For Up To 3 Doses. 19)  Calcium Carbonate-Vitamin D 600-400 Mg-Unit  Tabs (Calcium Carbonate-Vitamin D) .... Once Daily  Allergies (verified): 1)  ! Sulfadiazine (Sulfadiazine) 2)  ! * Ct Contrast Dye PMH-FH-SH reviewed-no changes except otherwise noted  Review of Systems  The patient denies anorexia, fever, weight loss, weight gain, syncope, peripheral edema, headaches, hematochezia, incontinence, muscle weakness, transient blindness, depression, and enlarged lymph nodes.  Physical Exam  General:  overweight white male Msk:  right wrist with some mild swelling and tenderness with extension and palpation to the MCP joint.   Impression & Recommendations:  Problem # 1:  WRIST PAIN, RIGHT (ICD-719.43) Acute but improving right wrist pain. Fracture unlikely. Suspect acute flare of gout.  Plan - x-ray wrist           continue NSIADS           add uric acid to up-coming lab draw.  Orders: T-Wrist Comp Right ([id number])  DG WRIST COMPLETE*R* - [id number]   Clinical Data: Wrist pain.   RIGHT WRIST - COMPLETE 3+ VIEW   Comparison:  None.   Findings:  There is no evidence of fracture or dislocation.  There is no evidence of arthropathy or other focal bone abnormality. Soft tissues are  unremarkable.   IMPRESSION: Negative.  Complete Medication List: 1)  Glucophage Xr 500 Mg Tb24 (Metformin hcl) .... 2 once daily 2)  Glipizide Xl 5 Mg Tb24 (Glipizide) .Marland Kitchen.. 1 by mouth qam 3)  Vasotec 5 Mg Tabs (Enalapril maleate) .... 2 by mouth qd 4)  Naproxen 500 Mg Tabs (Naproxen) .... Two times a day as needed 5)  Vitamin D3 1000 Unit Tabs (Cholecalciferol) .Marland Kitchen.. 1 qd 6)  Wellbutrin Xl 150 Mg Tb24 (Bupropion hcl) .Marland Kitchen.. 1 once daily 7)  Onetouch Ultra Test Strp (Glucose blood) .... Two times a day as directed 8)  Onetouch Lancets Misc (Lancets) .... Two times a day as directed 9)  Amitiza 24 Mcg Caps (Lubiprostone) .Marland Kitchen.. 1-2 once daily as needed constipation 10)  Aspirin 325 Mg Tabs (Aspirin) .... Take 1 tab by mouth every day 11)  Niaspan 500 Mg Cr-tabs (Niacin (antihyperlipidemic)) .... Take one tablet by mouth at bedtime . 12)  Omega-3-6-9 Caps (Omega 3-6-9 fatty acids) .... Once daily 13)  Vytorin 10-80 Mg Tabs (Ezetimibe-simvastatin) .... Once daily 14)  Centrum Silver Ultra Mens Tabs (Multiple vitamins-minerals) .... Once daily 15)  Levitra 20 Mg Tabs (Vardenafil hcl) .... 1/2-1 once daily as needed 16)  Dhea 25 Mg Tabs (Prasterone (dhea)) .... 2 by mouth qd 17)  Cipro 500 Mg Tabs (Ciprofloxacin hcl) .Marland Kitchen.. 1 by mouth two times a day (uses when traveling out of country) 18)  Nitrostat 0.4 Mg Subl (Nitroglycerin) .Marland Kitchen.. 1 tablet under tongue at onset of chest pain; you may repeat every 5 minutes for up to 3 doses. 19)  Calcium Carbonate-vitamin D 600-400 Mg-unit Tabs (Calcium carbonate-vitamin d) .... Once daily  Preventive Care Screening  Last Flu Shot:    Date:  04/01/2009    Results:  historical   Colonoscopy:    Date:  07/02/2005    Results:  historical

## 2010-08-01 NOTE — Progress Notes (Signed)
  Prescriptions: CIPRO 500 MG TABS (CIPROFLOXACIN HCL) 1 by mouth two times a day (uses when traveling out of country)  #20 Tablet x 0   Entered by:   Lamar Sprinkles, CMA   Authorized by:   Tresa Garter MD   Signed by:   Lamar Sprinkles, CMA on 10/25/2009   Method used:   Print then Give to Patient   RxID:   [id number] NIASPAN 500 MG CR-TABS (NIACIN (ANTIHYPERLIPIDEMIC)) Take one tablet by mouth at bedtime .  #90 x 3   Entered by:   Lamar Sprinkles, CMA   Authorized by:   Tresa Garter MD   Signed by:   Lamar Sprinkles, CMA on 10/25/2009   Method used:   Print then Give to Patient   RxID:   [id number] VYTORIN 10-80 MG TABS (EZETIMIBE-SIMVASTATIN) once daily  #90 x 3   Entered by:   Lamar Sprinkles, CMA   Authorized by:   Tresa Garter MD   Signed by:   Lamar Sprinkles, CMA on 10/25/2009   Method used:   Print then Give to Patient   RxID:   [id number] ONETOUCH LANCETS   MISC (LANCETS) two times a day as directed  #3 mth x 3   Entered by:   Lamar Sprinkles, CMA   Authorized by:   Tresa Garter MD   Signed by:   Lamar Sprinkles, CMA on 10/25/2009   Method used:   Print then Give to Patient   RxID:   [id number] ONETOUCH ULTRA TEST   STRP (GLUCOSE BLOOD) two times a day as directed  #3 mth x 3   Entered by:   Lamar Sprinkles, CMA   Authorized by:   Tresa Garter MD   Signed by:   Lamar Sprinkles, CMA on 10/25/2009   Method used:   Print then Give to Patient   RxID:   [id number] WELLBUTRIN XL 150 MG  TB24 (BUPROPION HCL) 1 once daily  #90 x 3   Entered by:   Lamar Sprinkles, CMA   Authorized by:   Tresa Garter MD   Signed by:   Lamar Sprinkles, CMA on 10/25/2009   Method used:   Print then Give to Patient   RxID:   [id number] GLIPIZIDE XL 5 MG  TB24 (GLIPIZIDE) 1 by mouth qam  #90 x 1   Entered by:   Lamar Sprinkles, CMA   Authorized by:   Tresa Garter MD   Signed by:   Lamar Sprinkles, CMA on 10/25/2009   Method used:   Print  then Give to Patient   RxID:   [id number] VASOTEC 5 MG  TABS (ENALAPRIL MALEATE) 2 by mouth qd  #180 x 3   Entered by:   Lamar Sprinkles, CMA   Authorized by:   Tresa Garter MD   Signed by:   Lamar Sprinkles, CMA on 10/25/2009   Method used:   Print then Give to Patient   RxID:   [id number] GLUCOPHAGE XR 500 MG  TB24 (METFORMIN HCL) 2 once daily  #180 x 3   Entered by:   Lamar Sprinkles, CMA   Authorized by:   Tresa Garter MD   Signed by:   Lamar Sprinkles, CMA on 10/25/2009   Method used:   Print then Give to Patient   RxID:   [id number]

## 2010-09-15 ENCOUNTER — Other Ambulatory Visit: Payer: Self-pay | Admitting: Internal Medicine

## 2010-09-15 ENCOUNTER — Encounter (INDEPENDENT_AMBULATORY_CARE_PROVIDER_SITE_OTHER): Payer: Self-pay | Admitting: *Deleted

## 2010-09-15 ENCOUNTER — Other Ambulatory Visit: Payer: 59

## 2010-09-15 DIAGNOSIS — E119 Type 2 diabetes mellitus without complications: Secondary | ICD-10-CM

## 2010-09-15 DIAGNOSIS — T887XXA Unspecified adverse effect of drug or medicament, initial encounter: Secondary | ICD-10-CM

## 2010-09-15 LAB — LIPID PANEL
LDL Cholesterol: 60 mg/dL (ref 0–99)
Total CHOL/HDL Ratio: 4
Triglycerides: 113 mg/dL (ref 0.0–149.0)
VLDL: 22.6 mg/dL (ref 0.0–40.0)

## 2010-09-15 LAB — BASIC METABOLIC PANEL
Chloride: 106 mEq/L (ref 96–112)
Creatinine, Ser: 1 mg/dL (ref 0.4–1.5)
GFR: 79.21 mL/min (ref >60.00)
Potassium: 4.7 mEq/L (ref 3.5–5.1)

## 2010-09-15 LAB — HEPATIC FUNCTION PANEL
ALT: 18 U/L (ref 0–53)
Bilirubin, Direct: 0.2 mg/dL (ref 0.0–0.3)
Total Bilirubin: 0.8 mg/dL (ref 0.3–1.2)

## 2010-09-22 ENCOUNTER — Encounter: Payer: Self-pay | Admitting: Internal Medicine

## 2010-09-22 ENCOUNTER — Ambulatory Visit (INDEPENDENT_AMBULATORY_CARE_PROVIDER_SITE_OTHER): Payer: 59 | Admitting: Internal Medicine

## 2010-09-22 DIAGNOSIS — E291 Testicular hypofunction: Secondary | ICD-10-CM

## 2010-09-22 DIAGNOSIS — K219 Gastro-esophageal reflux disease without esophagitis: Secondary | ICD-10-CM | POA: Insufficient documentation

## 2010-09-22 DIAGNOSIS — R5383 Other fatigue: Secondary | ICD-10-CM

## 2010-09-22 DIAGNOSIS — N529 Male erectile dysfunction, unspecified: Secondary | ICD-10-CM

## 2010-09-22 DIAGNOSIS — R5381 Other malaise: Secondary | ICD-10-CM

## 2010-09-22 DIAGNOSIS — E119 Type 2 diabetes mellitus without complications: Secondary | ICD-10-CM

## 2010-09-22 DIAGNOSIS — I2510 Atherosclerotic heart disease of native coronary artery without angina pectoris: Secondary | ICD-10-CM

## 2010-09-22 MED ORDER — VARDENAFIL HCL 20 MG PO TABS: 20.0000 mg | ORAL_TABLET | Freq: Every day | ORAL | Status: DC | PRN

## 2010-09-22 NOTE — Assessment & Plan Note (Signed)
On Rx 

## 2010-09-22 NOTE — Assessment & Plan Note (Signed)
He may need to use Testosterone

## 2010-09-22 NOTE — Assessment & Plan Note (Signed)
Cont current Rx. He may have more diarrhea due to Metformin

## 2010-09-22 NOTE — Progress Notes (Signed)
  Subjective:    Patient ID: [name] [name], male    DOB: 03-Sep-1950, 60 y.o.   MRN: [id number]  HPI  The patient presents for a follow-up visit to check on  diabetes, hyperlipidemia, HTN and hypogonadism. C/o gastric fullness and indigestion x 3 months   Review of Systems  Constitutional: Positive for fatigue.  HENT: Negative for sneezing.   Eyes: Negative for photophobia and visual disturbance.  Respiratory: Negative for cough and choking.   Cardiovascular: Positive for palpitations.  Gastrointestinal: Negative for vomiting.  Genitourinary: Negative for urgency and difficulty urinating.  Musculoskeletal: Negative for joint swelling and arthralgias.  Skin: Negative for rash.  Neurological: Negative for seizures and weakness.  Psychiatric/Behavioral: Negative for suicidal ideas and dysphoric mood.       Objective:   Physical Exam  Constitutional: He appears well-developed. No distress.       obese  HENT:  Head: Normocephalic.  Eyes: Pupils are equal, round, and reactive to light. No scleral icterus.  Neck: No JVD present.  Cardiovascular: Normal rate.   Pulmonary/Chest: He has no wheezes.  Abdominal: He exhibits no mass.  Lymphadenopathy:    He has no cervical adenopathy.  Skin: No rash noted.          Assessment & Plan:  DIABETES MELLITUS, TYPE II Cont current Rx. He may have more diarrhea due to Metformin  HYPOGONADISM He is still thinking if he should start using the gel  CORONARY ARTERY DISEASE On Rx  FATIGUE He may need to use Testosterone  ERECTILE DYSFUNCTION On Rx

## 2010-09-22 NOTE — Assessment & Plan Note (Signed)
He is still thinking if he should start using the gel

## 2010-11-10 ENCOUNTER — Other Ambulatory Visit: Payer: Self-pay | Admitting: Internal Medicine

## 2010-11-14 NOTE — Discharge Summary (Signed)
NAME:  [name] [name], [name] [name] NO.:  [id number]   MEDICAL RECORD NO.:  [id number]          PATIENT TYPE:  INP   LOCATION:  2023                         FACILITY:  [facility]   PHYSICIAN:  Salvatore Decent. Cornelius Moras, M.D. DATE OF BIRTH:  [birth year]   DATE OF ADMISSION:  04/06/2008  DATE OF DISCHARGE:                               DISCHARGE SUMMARY   FINAL DIAGNOSIS:  Severe three-vessel coronary artery disease.   SECONDARY DIAGNOSES:  1. Status post cardiac catheterization on April 02, 2008 showing      three-vessel coronary artery disease.  2. Diabetes mellitus.  3. Hypertension.  4. Hyperlipidemia.  5. Status post laparoscopic cholecystectomy.  6. Status post tonsillectomy.  7. Status post appendectomy.   IN-HOSPITAL OPERATIONS AND PROCEDURES:  Coronary artery bypass grafting  x3 using a left internal mammary artery to distal left anterior  descending coronary artery, saphenous vein graft to posterior descending  coronary artery with sequential saphenous vein graft to right posterior  lateral branch.  Endoscopic saphenous vein harvest from right thigh.   HISTORY AND PHYSICAL AND HOSPITAL COURSE:  The patient is a 60 year old  gentleman with no previous history of coronary artery disease, but risk  factors notable for history of type 2 diabetes mellitus, hyperlipidemia,  and hypertension.  The patient also has family history of coronary  artery disease.  The patient recently underwent laparoscopic  cholecystectomy.  A stress Myoview exam performed prior to this  operation was abnormal, but felt to be low risk for acute cardiac event.  He returned to see Dr. Myrtis Ser in followup and described some intermittent  atypical chest discomfort as well as episodes of exertional chest  discomfort suspicious for angina pectoris.  The patient underwent  elective cardiac catheterization on April 01, 2008.  He was found to  have three-vessel coronary artery disease with normal left ventricular  function.  The patient was seen in the office by Dr. Cornelius Moras and was  scheduled for coronary artery bypass grafting for April 08, 2008.  On  April 06, 2008, the patient presented to the emergency room with  symptoms of chest pain occurring at rest consistent with unstable  angina.  The patient was admitted to the hospital and treated medically.  The patient was felt to be stable and was scheduled for April 08, 2008.  For further details of the patient's past medical history and physical  exam, please see dictated H&P.   The patient was taken to the operating room on April 08, 2008, where he  underwent coronary artery bypass grafting x3 using a left internal  mammary artery to distal anterior descending coronary artery, saphenous  vein graft to posterior descending coronary artery with sequential  saphenous vein graft to right posterior lateral branch.  Endoscopic  saphenous vein harvest from right thigh was done.  The patient tolerated  this procedure well and was transferred to the Intensive Care Unit in  stable condition.  Postoperatively, the patient was noted to be  hemodynamically stable.  He was extubated in the evening of surgery.  Post-extubation, the patient noted to be alert and oriented x4.  Neuro  intact.  The patient's postoperative course was pretty much  unremarkable.  He was noted to be in normal sinus rhythm on postop day  #1.  He was able to be weaned off all drips.  Followup chest x-ray  remained stable and the patient had minimum drainage from chest tube.  Chest tubes and Swan-Ganz catheter were discontinued in normal fashion.  The patient was up and ambulating well.  He was felt to be stable and  ready for transfer to [facility].  The patient had been started on Lantus  insulin for management of diabetes mellitus.  The patient continued to  progress well.  He did remain in normal sinus rhythm.  Heart remained  stable.  Blood pressure remained stable, and he was able to be  started  on low-dose beta-blocker and tolerated it well.  He was able to be  weaned off oxygen with O2 sats, saturating greater than 90% on room air.  The patient's blood sugars are followed closely.  They were noted to be  stable.  The patient was started on his metformin and glipizide, and  Lantus was discontinued.  Blood sugars improved.  The patient remained  hemodynamically stable.  He did not require any transfusion.  The  patient did not develop any significant volume overload and was at  baseline prior to discharge home.  He was out of bed, ambulating well  without difficulty.  He was tolerating diet well.  No nausea or vomiting  noted.  All incisions were noted to be clean, dry, and intact, and  healing well.   The patient is tentatively ready for discharge home in the next 24-48  hours, pending he remained stable.   LABORATORY DATA:  Last labs showed a sodium of 140, potassium 3.3,  chloride of 106, bicarb of 27, BUN of 7, creatinine 1.05, and glucose of  99.  Potassium has been written for replacement and is 3.3.  White count  was 10.8, hemoglobin of 11.3, hematocrit 33.1, and platelet count 105.   FOLLOWUP APPOINTMENTS:  A followup appointment will be arranged with Dr.  Cornelius Moras in 3 weeks.  Our office will contact the patient with this  information.  The patient will need to obtain PMI chest x-ray 30 minutes  prior to this appointment.  The patient will need to follow up with Dr.  Myrtis Ser in 2 weeks.  He will need to contact his office to make these  arrangements.   ACTIVITY:  The patient is instructed no driving until released to do so,  no heavy lifting over 10 pounds.  He is told to ambulate 3-4 times per  day, progress as tolerated, and continue his breathing exercises.   INCISIONAL CARE:  The patient is told to shower washing his incisions  using soap and water.  He is to contact the office if he develops any  drainage or opening from any of his incision sites.   DIET:   The patient is educated on diet to be low-fat, low-salt, as well  as diabetic diet.   DISCHARGE MEDICATIONS:  1. Aspirin 325 mg daily.  2. Caltrate 600 plus vitamin D daily.  3. Glipizide 5 mg b.i.d.  4. Metformin 500 mg b.i.d.  5. Multivitamin daily.  6. Loratadine 10 mg p.r.n.  7. Valtrex 500 mg p.r.n.  8. Meclizine 12.5 mg p.r.n.  9. Crestor 5 mg daily.  10.Toprol-XL 25 mg daily.  11.Wellbutrin 150 mg daily.  12.Oxycodone 5 mg 1-2 tablets q.4-6 h.  p.r.n.      Theda Belfast, PA      Salvatore Decent. Cornelius Moras, M.D.  Electronically Signed    KMD/MEDQ  D:  04/11/2008  T:  04/11/2008  Job:  [id number]   cc:   Luis Abed, MD, [facility]

## 2010-11-14 NOTE — Assessment & Plan Note (Signed)
[facility]                            CARDIOLOGY OFFICE NOTE   NAME:[name] [name], [name] [name]                MRN:          [id number]  DATE:07/08/2008                            DOB:          [birth year]    Mr. Fray was seen for cardiology followup.  He is post CABG.  He is  here with his wife today.  I am following him for his elevated  cholesterol and coronary artery disease.  In addition, there is a new  problem and that PVCs have been noted while he is at cardiac rehab.  I  have received papers that have been reviewed from rehab.  He has  scattered PVCs.  He has not had any couplets or ventricular tachycardia.  He has not had any syncope or presyncope.   Additional issues today concerned his lipids.  His HDL was low and we  had a full discussion about this.   PAST MEDICAL HISTORY:   ALLERGIES:  SULFA and DYE.   MEDICATIONS:  See the flow sheet.   REVIEW OF SYSTEMS:  He is not having any GI or GU symptoms.  He has no  fevers, chills, headaches, or skin rashes.  His review of systems is  negative.   PHYSICAL EXAMINATION:  VITAL SIGNS:  Blood pressure is 112/72 with a  pulse of 70.  GENERAL:  The patient is oriented to person, time, and place.  Affect is  normal.  He is here with his wife today.  She has multiple questions and  I have answered each of them.  HEENT:  No xanthelasma.  There is normal extraocular motion.  NECK:  There are no carotid bruits.  There is no jugular venous  distention.  LUNGS:  Clear.  CARDIAC:  An S1 with an S2.  There are no clicks or significant murmurs.  ABDOMEN:  Soft.  EXTREMITIES:  He has no peripheral edema.   Recent labs reveal a cholesterol of 101.  Triglycerides 147.  HDL 30.  LDL 41.  This is on Vytorin 10/80.   Problems are listed on the prior note of May 24, 2008.  #4.  Elevated cholesterol.  This is treated.  Low HDL, it does require  [place] to consider starting Niaspan.  I discussed this with  him and he is in  favor.  We will start at 500 mg daily.  The patient is also post CABG.  He is doing very well.  I will see him for cardiology followup in May.  There  was an extensive amount of time spent reviewing all the meds and all of  the issues with the patient and his wife and in making decisions about  starting his medication for his HDL.     Luis Abed, MD, [facility]  Electronically Signed    JDK/MedQ  DD: 07/08/2008  DT: 07/09/2008  Job #: [id number]   cc:   Georgina Quint. Plotnikov, MD

## 2010-11-14 NOTE — Op Note (Signed)
NAME:  [name] [name], [name] [name] NO.:  [id number]   MEDICAL RECORD NO.:  [id number]          PATIENT TYPE:  INP   LOCATION:  2305                         FACILITY:  [facility]   PHYSICIAN:  Salvatore Decent. Cornelius Moras, M.D. DATE OF BIRTH:  [birth year]   DATE OF PROCEDURE:  04/08/2008  DATE OF DISCHARGE:                               OPERATIVE REPORT   PREOPERATIVE DIAGNOSIS:  Severe 3-vessel coronary artery disease.   POSTOPERATIVE DIAGNOSIS:  Severe 3-vessel coronary artery disease.   PROCEDURE:  Median sternotomy for coronary artery bypass grafting x3  (left internal mammary artery to distal left anterior descending  coronary artery, saphenous vein graft to posterior descending coronary  artery with sequential saphenous vein graft to right posterolateral  branch, endoscopic saphenous vein harvest from right thigh).   SURGEON:  Salvatore Decent. Cornelius Moras, MD   ASSISTANT:  Evelene Croon, MD   SECOND ASSISTANT:  Coral Ceo, PA   ANESTHESIA:  General.   BRIEF CLINICAL NOTE:  The patient is a 60 year old gentleman with no  previous history of coronary artery disease but risk factors notable for  history of type 2 diabetes mellitus, hyperlipidemia, and hypertension.  The patient also has family history of coronary artery disease.  The  patient recently underwent laparoscopic cholecystectomy.  Stress Myoview  exam performed prior to this operation was abnormal, but felt to be low  risk for acute cardiac event.  He returned to see Dr. Myrtis Ser in followup  and described some intermittent atypical chest discomfort as well as an  episode of exertional chest discomfort suspicious for angina pectoris.  The patient subsequently underwent elective cardiac catheterization on  April 01, 2008.  He was found to have severe 2-vessel coronary artery  disease with normal left ventricular function.  A full consultation has  been dictated previously.  Following initial outpatient evaluation, the  patient then  presented to the emergency room with symptoms of chest pain  occurring at rest consistent with unstable angina pectoris.  The patient  was admitted to the hospital, treated medically, and remained clinically  stable and is now brought to the operating room for urgent surgical  revascularization.   OPERATIVE FINDINGS:  1. Normal left ventricular function.  2. Normal aortic valve with mild aortic regurgitation.  3. Good quality left internal mammary artery and saphenous vein      conduit for grafting.  4. Good quality target vessels for grafting.   OPERATIVE NOTE:  The patient was brought to the operating room on the  above-mentioned date and central monitoring was established by the  Anesthesia Service under the care and direction of Dr. Judie Petit.  Specifically, a Swan-Ganz catheter was placed through the right internal  jugular approach.  A radial arterial line was placed.  Intravenous  antibiotics were administered.  Following induction with general  endotracheal anesthesia, a Foley catheter was placed.  The patient's  chest, abdomen, both groins, and both lower extremities were prepared  and draped in a sterile manner.  Baseline transesophageal echocardiogram  was performed by Dr. Randa Evens.  This demonstrates normal left ventricular  function.  There was  mild aortic insufficiency.  The aortic valve was  otherwise structurally normal, tricuspid, and free of any sign of aortic  stenosis.  No other abnormalities of significance were identified.   A median sternotomy incision was performed and the left internal mammary  artery was dissected from the chest wall and prepared for bypass  grafting.  The left internal mammary artery was a good quality conduit.  Simultaneously, saphenous vein was obtained from the patient's right  thigh using endoscopic vein harvest technique.  The saphenous vein was a  good quality conduit.  After the saphenous vein had been removed, the  small  incision in the right thigh was closed in multiple layers with  running absorbable suture.  The patient was heparinized systemically and  the left internal mammary artery transected distally.  It was noted to  have excellent flow.   The pericardium was opened.  The ascending aorta was mildly dilated, but  otherwise normal in appearance.  The ascending aorta and right atrium  were cannulated for cardiopulmonary bypass.  Adequate heparinization was  verified.  Cardiopulmonary bypass was begun and the surface of the heart  inspected.  Distal target vessels were selected for coronary artery  bypass grafting.  A temperature probe was placed in the left ventricular  septum and a cardioplegic catheter was placed in the ascending aorta.   The patient was allowed to cool passively to 32 degrees systemic  temperature.  The aortic cross-clamp was applied and cold blood  cardioplegia was administered initially in an antegrade fashion through  his aortic root.  Iced saline slush was applied for topical hypothermia.  The initial cardioplegic arrest and myocardial cooling was felt to be  excellent.  Repeat doses of cardioplegia were administered  intermittently throughout the cross-clamp portion of the operation  through the aortic root and down subsequently placed vein graft to  maintain left ventricular septal temperature below 15 degrees  centigrade.   The following distal coronary anastomoses were performed:  1. The posterior descending coronary artery was grafted with a      saphenous vein graft in a side-to-side fashion.  This vessel      measured 2.0 mm in diameter and is a good-quality target vessel for      grafting.  2. The posterolateral branch off the distal right coronary artery was      grafted using a sequential saphenous vein graft off the vein placed      to the posterior descending coronary artery.  This vessel measured      1.7 mm in diameter and is a good-quality target vessel  for      grafting.  3. The distal left anterior descending coronary artery was grafted      with a left internal mammary artery in an end-to-side fashion.  The      left anterior descending coronary artery was intramyocardial      throughout most of its course, but it is grafted just after it      rises to the surface towards the apex of the heart.  At this level,      the vessel measured 1.5 mm in diameter and is a good-quality target      vessel for grafting.   The single proximal saphenous vein anastomoses was performed directly to  the ascending aorta prior to removal of the aortic cross-clamp.  The  left ventricular septal temperature rises rapidly with reperfusion of  the left internal mammary artery.  The aortic  cross-clamp was removed  after a total crossclamp time of 49 minutes.  The heart began to beat  spontaneously without need for cardioversion.  All proximal and distal  coronary anastomoses were inspected for hemostasis and appropriate graft  orientation.  Epicardial pacing wires were fixed to the right  ventricular free wall into the right atrial appendage.  The patient was  rewarmed to 37 degrees centigrade temperature.  The patient was weaned  from cardiopulmonary bypass without difficulty.  The patient's rhythm at  separation from bypass was normal sinus rhythm.  Atrial pacing was  employed to increase heart rate.  No inotropic support was required.  Total cardiopulmonary bypass time for the operation was 67 minutes.  A  followup transesophageal echocardiogram performed by Dr. Randa Evens  demonstrates normal left ventricular function following separation from  bypass.   The venous and arterial cannulae are removed uneventfully.  Protamine  was administered to reverse anticoagulation.  The mediastinum and left  chest were irrigated with saline solution containing vancomycin.  Meticulous surgical hemostasis was ascertained.  The mediastinum and the  left pleural space  were drained with 3 chest tubes exited through  separate stab incisions inferiorly.  The pericardium and soft tissues  anterior to the aorta were reapproximated loosely.  The sternum was  closed with double-strength sternal wire.  The soft tissues anterior to  the sternum were closed in multiple layers and the skin was closed with  a subcuticular skin closure.  The On-Q continuous pain management system  was utilized to facilitate postoperative pain control.  Two 10-inch  catheters supplied with the On-Q kit were tunneled into the deep  subcutaneous tissues and positioned just lateral to the lateral border  of the sternum on either side.  Each catheter was flushed with 5 mL of  0.5% bupivacaine solution and ultimately connected to continuous  infusion pump.   The patient tolerated the procedure well and was transported to the  Surgical Intensive Care Unit in stable condition.  There were no  intraoperative complications.  All sponge, instrument, and needle counts  were verified and correct at completion of the operation.  No blood  products were administered.      Salvatore Decent. Cornelius Moras, M.D.  Electronically Signed     CHO/MEDQ  D:  04/08/2008  T:  04/09/2008  Job:  [id number]   cc:   Bevelyn Buckles. Bensimhon, MD  Luis Abed, MD, [place]  Georgina Quint. Plotnikov, MD

## 2010-11-14 NOTE — Assessment & Plan Note (Signed)
[facility]                            CARDIOLOGY OFFICE NOTE   NAME:[name] [name], [name] [name]                   MRN:          [id number]  DATE:03/31/2008                            DOB:          18-Mar-1951    [name] [name] is a very pleasant 60 year old gentleman.  There is no prior  documented coronary disease.  He had a stress Myoview scan done before a  cholecystectomy.  The study showed a fixed inferior defect.  There was  no significant wall motion abnormality.  This was read as a low-risk  scan and he very successfully had his gallbladder removed.  He is now  here for further evaluation.  The patient describes his gallbladder  symptoms as right lower quadrant and right upper quadrant of his  abdomen.  He has had some chest discomfort.  It had been limited.  In  addition, he mentioned that when walking rapidly through an airport, he  has had some arm tightness in the past.  He has diabetes.  There is a  family history of coronary disease.  He also has hypercholesterolemia.  He has not had any syncope or presyncope.  He did not have any marked  shortness of breath.  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   PAST MEDICAL HISTORY:   ALLERGIES:  SULFA.   MEDICATIONS:  1. Aspirin 81.  2. Glucophage ER 500 b.i.d.  3. Calcium.  4. Vitamins.  5. Glucotrol XL 10 mg daily.  6. Vasotec 5 b.i.d.  7. Wellbutrin 150.  8. Crestor 2.5 mg.  9. Omega III fish oil.   OTHER MEDICAL PROBLEMS:  See the list below.   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  He is married.  He does not  smoke.   FAMILY HISTORY:  He had does have a family history of coronary disease.   REVIEW OF SYSTEMS:  He has some seasonal allergies.  There is a history  of sleep apnea.  He had surgery for this and his wife says he does  continue to  snore.  He has some constipation and some fatigue.  He has  some mild urinary flow and frequency problems.  Otherwise his review of  systems is negative.   PHYSICAL EXAM:  VITAL SIGNS:  Weight is 212 pounds.  Blood pressure is  135/80 with a pulse 72.  CONSTITUTIONAL:  The patient is oriented to person, time and place.  Affect is normal.  He is here with his wife in the room.  HEENT:  Reveals no xanthelasma.  He has normal extraocular motion.  NECK:  There are no carotid bruits.  There is no jugular venous tension.  LUNGS:  Clear.  Respiratory effort is not labored.  CARDIAC:  Exam reveals S1 with an S2.  There are no clicks or  significant murmurs.  ABDOMEN:  Soft.  He has no peripheral edema.   The patient underwent a stress Myoview scan  on February 13, 2008.  He  exercised 7 minutes.  There was fatigue and no chest pain.  There was  mild ST flattening and depression that is of borderline significance.  He does have a significant inferior defect that is fixed at the base and  mid inferior wall, but he had good wall motion.   PROBLEMS:  1. Include history of appendectomy.  2. History of recent laparoscopic cholecystectomy.  3. Some seasonal allergies.  4. History of sleep apnea for which he had surgery in the past, but he      continues to have some snoring.  5. History of an incidental finding of a cyst on his kidney.  6. Elevated cholesterol.  7. Diabetes.  8. History of some depression.  9. Abnormal Myoview scan.  Review of this scan leads me to believe      that it is possible that he has had a silent myocardial infarction      in the past.  The patient and I, and his wife, discussed this at      great length.  He travels a great deal.  He has had some discomfort      in his arm.  He has diabetes.  I believe that diagnostic cardiac      catheterization is appropriate to further assess the status of his      coronaries.  I discussed risks and benefits with him and they want       to proceed.  We will arrange this.     Luis Abed, MD, [facility]  Electronically Signed    JDK/MedQ  DD: 03/31/2008  DT: 03/31/2008  Job #: [id number]   cc:   Georgina Quint. Plotnikov, MD

## 2010-11-14 NOTE — Cardiovascular Report (Signed)
NAME:  [name] [name], [name] [name] NO.:  [id number]   MEDICAL RECORD NO.:  [id number]          PATIENT TYPE:  OIB   LOCATION:  1962                         FACILITY:  [facility]   PHYSICIAN:  Bevelyn Buckles. 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  He is  having some atypical chest pain.  A Myoview showed a fixed anterior  defect with normal wall motion.  He was seen by Dr. Myrtis Ser and referred  for cardiac catheterization.   PROCEDURES PERFORMED:  1. Selective coronary angiography.  2. Left heart cath.  3. Left ventriculogram.  4. Left subclavian angiography.  5. Abdominal angiography.   DESCRIPTION OF PROCEDURE:  Risks and indications of catheterization were  explained.  A 4-French arterial sheath was placed in the right femoral  artery using a modified Seldinger technique.  Standard catheters  including JL-4, 3DRC and angled pigtail were used for procedure.  All  catheters were exchanged over wire.  No apparent complications.  Central  aortic pressure is 131/68 with a mean of 96.  LV pressure was 123/5 with  EDP of 13.   The aortic valve was mildly calcified but fairly easy to cross.  The  mean gradient was 8 mmHg.   Left main was normal.   LAD was a long vessel wrapping the apex.  It was heavily calcified to  the proximal and midportions.  There was a 95% tubular stenosis at the  ostium of the LAD.  There was a 95% lesion in the midsection at the  takeoff of the first septal perforator.   Left circumflex was a large nearly codominant vessel.  It gave off a  large OM-1 and moderate-sized OM-2 and a moderate-sized posterolateral.  There was a 40% lesion proximally in the AV groove.   The right coronary artery was totally occluded proximally.  There  were  bridging collaterals filling the distal vessel.  There were also  significant collaterals from the left circumflex and the distal LAD.  There was a 90% blockage in the distal RCA before the takeoff of the  PDA.   Left ventriculogram done in the RAO position showed an EF of 60% with no  regional wall motion abnormalities.  No significant mitral  regurgitation.   Left subclavian angiography showed a patent subclavian with patent LIMA.   Abdominal aortogram showed patent renal arteries bilaterally with no  aneurysmal dilatation.   ASSESSMENT:  1. Severe two-vessel coronary artery disease with significant ostial      LAD disease.  2. Mildly calcified aortic valve with mild aortic stenosis as      described above.  3. Normal LV function.   PLAN:  Plan will be for surgical consultation for bypass surgery.  I  have contacted Dr. Cornelius Moras.  He will see him on Monday in the clinic.      Bevelyn Buckles. Bensimhon, MD  Electronically Signed  DRB/MEDQ  D:  04/02/2008  T:  04/02/2008  Job:  [id number]

## 2010-11-14 NOTE — H&P (Signed)
NAME:  [name] [name]               ACCOUNT NO.:  [id number]   MEDICAL RECORD NO.:  [id number]          PATIENT TYPE:  INP   LOCATION:  3713                         FACILITY:  [facility]   PHYSICIAN:  [name] Beals. Juanda Chance, MD, FACCDATE OF BIRTH:  [birth year]   DATE OF ADMISSION:  04/06/2008  DATE OF DISCHARGE:                              HISTORY & PHYSICAL   PRIMARY CARE PHYSICIAN:  [name] Quint. Plotnikov, MD   PRIMARY CARDIOLOGIST:  [name] Abed, MD, [facility].   CHIEF COMPLAINT:  Chest pain.   HISTORY OF PRESENT ILLNESS:  [name] [name] is a 60 year old male with a  recent diagnosis of coronary artery disease.  He has had episodes of  chest pain last p.m. and this a.m.  They occurred at rest.  They are  described as a pressure and a tingling across the lower edge of his ribs  and lower part of his chest.  He also had some tingling in his left arm.  His symptoms are associated with slight diaphoresis and a flushed  feeling.  His wife states he would get a little gray looking.  He  recently complains of fatigue and dyspnea on exertion, but other than  that he has had no exertional symptoms.  He called our office and when  he described his symptoms he was told to come into the emergency room.  His symptoms peaked at a 5/10.  This morning they lasted greater than 1  hour.  Of note, he was seen by [name] [name] and scheduled for bypass surgery  on Thursday, April 08, 2008.   PAST MEDICAL HISTORY:  1. Status post cardiac catheterization, April 02, 2008, showing LAD      95%, circumflex 40% in the AV group, RCA total, AF 60%.  2. Diabetes.  3. Hypertension.  4. Hyperlipidemia.  5. Borderline obesity with a body mass index of 29.2.  6. Family history of coronary artery disease.   SURGICAL HISTORY:  He is status post cardiac catheterization as well as  laparoscopic cholecystectomy, tonsillectomy, and appendectomy.   ALLERGIES:  He is allergic or intolerant to SULFA and IB DYE.   CURRENT MEDICATIONS:  1. Aspirin 81 mg a day.  2. Glucophage 500 mg b.i.d.  3. Calcium daily.  4. Multivitamin daily.  5. Vytorin 10/80 daily.  6. Glucotrol XL 10 mg a day.  7. Vasotec 5 mg a day.  8. Wellbutrin 150 mg a day.  9. Crestor 2.5 mg day.  10.Omega 3 and saw palmetto daily.   SOCIAL HISTORY:  Lives in [place] with his wife.  He works at [employer] as a Engineer, maintenance (IT).  He has no history of  alcohol, tobacco, or drug abuse.   FAMILY HISTORY:  His mother is alive in her late 29s with a history of  Alzheimer's with no heart disease.  His father first developed angina in  his 15s, but did not have an MI or heart catheterization until in his  83s.  He has got no siblings with coronary artery disease.   REVIEW OF SYSTEMS:  He has had some episodes of  diaphoresis.  The chest  pain is described above.  He has had dyspnea on exertion, but denies  orthopnea, PND, edema, or palpitations.  He has some slight anxiety  related to the upcoming surgery.  He has occasional arthralgia.  He  denies hematemesis, hemoptysis, melena, or reflux symptoms.  A full 14-  point review of systems is otherwise negative.   PHYSICAL EXAM:  VITAL SIGNS: Temperature is 98.0, blood pressure 144/82,  pulse 82, respiratory rate 20, O2 saturation 96% on room air.  GENERAL:  He is a well-developed, well-nourished, white male in no acute  distress.  HEENT:  Normal.  NECK:  There is no lymphadenopathy, thyromegaly, bruit, or JVD noted.  CV:  His heart is regular, rate, and rhythm with an S1 and S2 and a soft  systolic murmur is noted.  LUNGS:  Clear to auscultation bilaterally.  SKIN:  No rashes or lesions are noted.  ABDOMEN:  Soft and nontender with active bowel sounds.  EXTREMITIES:  There is no cyanosis, clubbing, or edema noted.  MUSCULOSKELETAL:  There is no joint deformity or effusion in the spine  or CVA tenderness.  NEUROLOGIC:  He is alert and oriented.  Cranial nerves II-XII grossly  intact.    EKG is sinus rhythm rate 82 with no acute ischemic changes.  He has  small, nondiagnostic Q-waves in leads III and aVF.   Laboratory values and chest x-ray are pending.   IMPRESSION:  [name] [name] was seen today by Dr. Juanda [name].  He is a 57-year-  old male with a recent outpatient cath and severe 2-vessel coronary  artery disease with preserved left ventricular function who is scheduled  for surgery on April 08, 2008.  Yesterday and today, he had substernal  chest pain described as a pressure and a burning.  The symptoms are very  brief in duration and not exertional.  Today, he had more prolonged  symptoms and came to the emergency room by EMS.  En route, he received  sublingual nitroglycerin with improvement in his symptoms.  He will be  admitted and the surgeons will be contacted to let them know he is here.  An echocardiogram was scheduled  as an outpatient and we will perform this in hospital.  He will be  started on oral nitrates and IV heparin.  He will await cardiac  catheterization in hospital.  We will cycle cardiac enzymes.  He will be  continued on his other home medications and a diabetic diet.      [name] Demark, PA-C      [name] R. Juanda Chance, MD, [facility]  Electronically Signed    [name] [name]  D:  04/06/2008  T:  04/07/2008  Job:  [id number]

## 2010-11-14 NOTE — Assessment & Plan Note (Signed)
OFFICE VISIT   [name] [name], [name] [name]  DOB:  December 27, 1950                                        May 03, 2008  CHART #:  [id number]   HISTORY OF PRESENT ILLNESS:  The patient returns for routine followup,  status post coronary artery bypass grafting x3 on April 08, 2008.  His  postoperative recovery has been uneventful.  Following hospital  discharge, he has continued to do quite well.  He has been seen in  followup by Dr. Myrtis Ser and he returns to our office for routine followup  today.  He has not yet heard from the cardiac rehab team regarding  starting outpatient cardiac rehab.  Overall, he has no complaints.  He  has minimal residual soreness in his chest and he has not been taking  any pain relievers.  He is sleeping well at night.  He has not had  shortness of breath.  His appetite is good.  His blood sugars have been  under good control.  Overall, he has no complaints.  Medications remain  unchanged from the time of hospital discharge except the fact that he is  no longer taking Vasotec.   PHYSICAL EXAMINATION:  GENERAL:  A well-appearing male.  VITAL SIGNS:  Blood pressure 118/75, pulse 67, oxygen saturation 97% on  room air.  CHEST:  A median sternotomy incision that is healing nicely.  The  sternum is stable on palpation.  LUNGS:  Breath sounds are clear to auscultation and symmetrical  bilaterally.  No wheezes or rhonchi noted.  CARDIOVASCULAR:  Regular rate and rhythm.  No murmurs, rubs, or gallops  are noted.  ABDOMEN:  Soft and nontender.  EXTREMITIES:  Warm and well perfused.  The small incision from right  thigh endoscopic vein harvest incision has healed nicely.  There is no  lower extremity edema.  The remainder of his physical exam is  unremarkable.   DIAGNOSTIC TEST:  Chest x-ray obtained at the [facility]  today is reviewed.  This demonstrates clear lung fields bilaterally.  There are no pleural effusions of any  significance.  All the sternal  wires appear intact.   IMPRESSION:  Excellent progress following recent coronary artery bypass  grafting.   PLAN:  I have encouraged the patient to continue to gradually increase  his physical activity as tolerated over the next several weeks with his  only specific limitation remaining that he refrain from heavy lifting or  strenuous use of his arms or shoulders for least another 2 months.  I  think he can resume driving an automobile, but I have suggested that he  start initially driving short distances during daylight hours and  increase from there.  I have encouraged him to get involve in the  cardiac rehab program.  We have not made any changes in his current  medications.  I have reminded him how important it will remain for him  to keep his diabetes under very close control.  All of his questions  have been addressed.  With respect to return to work, I suspect that he  will need to be out of work for full 90 days, but probably around the  first year he could return to work without significant limitations.  In  the future, he will call and return to see [place] here at [facility] only should further problems or difficulties  arise.   Salvatore Decent. Cornelius Moras, M.D.  Electronically Signed   CHO/MEDQ  D:  05/03/2008  T:  05/03/2008  Job:  [id number]   cc:   Luis Abed, MD, [facility]  Bevelyn Buckles. Bensimhon, MD  Georgina Quint. Plotnikov, MD

## 2010-11-14 NOTE — Assessment & Plan Note (Signed)
[facility]                            CARDIOLOGY OFFICE NOTE   NAME:[name] [name]                   MRN:          [id number]  DATE:04/29/2008                            DOB:          [birth year]    [name] is seen today to follow up his coronary artery disease.  I  saw him last on March 31, 2008.  At that time, I arranged for  cardiac catheterization.  His cath was done and it was felt that he  needed to undergo bypass surgery.  He was seen by Dr. Cornelius [name] and underwent  CABG.  I have reviewed the discharge information from the hospital.  The  patient underwent CABG x3 with a LIMA to the LAD, vein graft to the  posterior descending with a sequential graft to the right posterolateral  branch.  He did very well.  He recovered very nicely.  He is now back in  the office for followup.  He is not having any chest pain.  He is  getting stronger every day.  He does mentioned some numbness in the  anterior chest and he understands that this is a normal postoperative  findings and symptoms.   PAST MEDICAL HISTORY:   ALLERGIES:  SULFA and DYE allergies.   MEDICATIONS:  As of today he is on Glucophage, calcium, Centrum,  Glucotrol, Wellbutrin, aspirin, metoprolol 25, and Crestor 5.  These  will be re-reviewed.  See the end of this note.   OTHER MEDICAL PROBLEMS:  See the list below.   REVIEW OF SYSTEMS:  He really is not having any significant problems.  He is not having any GI or GU symptoms.  He has no fevers, chills, or  rashes.  His review of systems otherwise is negative.   PHYSICAL EXAMINATION:  VITAL SIGNS:  Blood pressure today is 104/70.  Pulse is 72.  GENERAL:  The patient is oriented to person, time, and place.  Affect is  normal.  HEENT:  No xanthelasma.  He has normal extraocular motion.  NECK:  There are no carotid bruits.  There is no jugular venous  distention.  LUNGS:  Clear.  Chest wound on the sternum is clear and healing  nicely.  CARDIAC:  An S1 with an S2.  There are no clicks or significant murmurs.  ABDOMEN:  Soft.  He has no significant peripheral edema.   EKG today reveals that he is holding sinus rhythm.   PROBLEMS:  1. History of appendectomy and laparoscopic cholecystectomy.  2. History of sleep apnea for which he had surgery in the past, but he      continues to have some snoring.  3. History of an incidental finding of a cyst on his kidney in the      past.  4. Elevated cholesterol, treated.  5. Diabetes, treated.  6. History of some depression, treated.  7. Coronary artery disease.  As outlined above, the patient has now      undergone coronary artery bypass graft.  I reviewed all of his      hospital data.  I have reviewed all of  his current issues with the      patient and his wife.  There are ongoing questions about his      discharge medications.  I will rereview these medications with him      and finalize them in the flow sheet in the chart.  He is stable.  I      will see him back for cardiology followup in 6 weeks.  He is to      increase his activity.  He will go to cardiac rehab.     [name] Abed, MD, [facility]  Electronically Signed    JDK/MedQ  DD: 04/29/2008  DT: 04/30/2008  Job #: [id number]   cc:   [name] [name]. [name] [name], M.D.  [name] Quint. Plotnikov, MD

## 2010-11-14 NOTE — Assessment & Plan Note (Signed)
[facility]                            CARDIOLOGY OFFICE NOTE   NAME:[name] [name]                MRN:          [id number]  DATE:05/24/2008                            DOB:          Nov 03, 1950    [name] [name] continues to do very well.  He is status post CABG.  He is  increasing his activities.  He is doing his cardiac rehab.  He is doing  well.  He is not having any chest pain or shortness of breath.   PAST MEDICAL HISTORY:   ALLERGIES:  SULFA and DYE allergies.   MEDICATIONS:  1. Glucophage.  2. Calcium.  3. Centrum.  4. Glucotrol.  5. Enalapril 5 mg b.i.d. to be restarted tomorrow.  6. Wellbutrin.  7. Aspirin.  8. Metoprolol 25 to be stopped tomorrow.  9. Omega III.  10.Vytorin 10/80.  11.Vitamin D.   OTHER MEDICAL PROBLEMS:  See the complete list below.   REVIEW OF SYSTEMS:  He has constipation.  We talked about Metamucil and  the possibility of MiraLax.  He has no headaches or eye problems or skin  rashes.  He has no GU symptoms.  His review of systems, otherwise is  negative.   PHYSICAL EXAMINATION:  VITAL SIGNS:  Blood pressure is 110/62 with a  pulse of 65.  GENERAL:  The patient is oriented to person, time, and place.  Affect is  normal.  HEENT:  No xanthelasma.  He has normal extraocular motion.  NECK:  There are no carotid bruits.  There is no jugular venous  distention.  LUNGS:  Clear.  Respiratory effort is not labored.  CARDIAC:  S1 and S2.  There are no clicks or significant murmurs.  ABDOMEN: Soft.  EXTREMITIES:  He has no peripheral edema.   PROBLEMS:  1. History of appendectomy and laparoscopic cholecystectomy.  2. Sleep apnea for which he had surgery in the past, but he has some      continued snoring.  3. Incidental finding of a cyst on the kidney in the past.  4. Elevated cholesterol, being treated.  5. Diabetes, treated.  6. History of some depression, treated.  7. Coronary artery disease, status post  coronary artery bypass graft      and he is doing very well.  He is continuing in rehab.   PLAN:  1. Discontinue beta-blocker.  2. Resume his ACE inhibitor as it is an optimal drug with his      diabetes.  3. Communicate to his insurance company that he can return to work      half a day each day starting on December 3, and then he can return      to full work on July 05, 2008.  I will see him in 6 months.     [name] Abed, MD, [facility]  Electronically Signed    JDK/MedQ  DD: 05/24/2008  DT: 05/25/2008  Job #: [id number]   cc:   Georgina Quint. Plotnikov, MD  Salvatore Decent. Cornelius Moras, M.D.

## 2010-11-14 NOTE — Op Note (Signed)
NAME:  [name] [name], [name] [name] NO.:  [id number]   MEDICAL RECORD NO.:  [id number]          PATIENT TYPE:  AMB   LOCATION:  DAY                          FACILITY:  [facility]   PHYSICIAN:  Adolph Pollack, M.D.DATE OF BIRTH:  July 11, 1950   DATE OF PROCEDURE:  02/23/2008  DATE OF DISCHARGE:                               OPERATIVE REPORT   PREOPERATIVE DIAGNOSIS:  Symptomatic cholelithiasis.   POSTOPERATIVE DIAGNOSIS:  Symptomatic cholelithiasis.   PROCEDURE:  Laparoscopic cholecystectomy with intraoperative  cholangiogram.   SURGEON:  Adolph Pollack, M.D.   ASSISTANT:  Leonie Man, M.D.   ANESTHESIA:  General.   INDICATION:  This is a 61 year old male who has had some biliary colic  following fatty meals and has had ultrasound demonstrating  cholelithiasis.  He has also had a transition hyperbilirubinemia at this  is normal on today's labs.  He now presents for elective laparoscopic  possible open cholecystectomy.  Procedure and risks were discussed with  him preoperatively.   TECHNIQUE:  He is brought to the operating room, placed supine on the  operating table and general anesthetic was administered.  Hair on the  abdominal wall was clipped and the abdominal wall sterilely prepped and  draped.  Marcaine was infiltrated the subcutaneous tissue in the  subumbilical region and the subumbilical incision was made through the  skin, subcutaneous tissue, fascia and peritoneum entering the peritoneal  cavity under direct vision.  A pursestring suture of 0 Vicryl was placed  around the fascial edges.  A Hassan trocar was introduced to the  peritoneal cavity and pneumoperitoneum created by insufflation of CO2  gas.   Next laparoscope was introduced and he is placed in reversed  Trendelenburg position with right side tilted slightly up.  An 11-mm  trocar was placed through an epigastric incision and two 5 mm trocars  placed in the right upper quadrant incisions.  The  fundus of the  gallbladder was grasped.  There omental adhesions to the liver  surrounding the gallbladder and to the gallbladder itself.  Using  careful dissection close to the liver the gallbladder I freed up the  adhesions between the omentum and liver and omentum and gallbladder  itself allowing the fundus to be retracted toward the right shoulder and  the infundibulum to be grasped.  With dissection on the gallbladder I  then mobilized infundibulum and identified the cystic duct and the  gallbladder neck.  I created a window around the cystic duct.  A clip  was placed at the cystic duct gallbladder junction and a small incision  made in the cystic duct.  A cholangiocatheter was then passed through  the anterior abdominal wall, placed into the cystic duct and  cholangiogram was performed.   Using real time fluoroscopy dilute contrast was injected to the cystic  duct which was of moderate to long length.  The common hepatic, right  left hepatic, common bile ducts all filled with contrast.  Contrast  promptly drained into the duodenum without obvious evidence of  obstruction.  Final reports pending radiologist's interpretation.   I then removed the  cholangiocatheter, the cystic duct was clipped three  times on the biliary side and divided.  Using blunt dissection I then  identified an anterior branch of the cystic artery close to the  gallbladder.  It was clipped and divided.  I then began freeing up the  gallbladder from liver and identified the posterior cystic artery branch  and clipped and divided it.  The gallbladder was then dissected free  from liver using electrocautery and placed in Endopouch bag.   The gallbladder fossa was then copiously irrigated and bleeding points  controlled with electrocautery.  Upon further inspection hemostasis was  adequate and no bile leak was noted.   The gallbladder was then removed through the subumbilical port in the  Endopouch bag.  The  subumbilical fascial defect was closed by tightening  up and tying down the pursestring suture.  The remaining irrigation  fluid was evacuated.  Trocars removed and the pneumoperitoneum was  released.   Skin incisions were closed with 4-0 Monocryl subcuticular stitches.  Steri-Strips and sterile dressings were applied.   He tolerated the procedure without apparent complications and was taken  to recovery in satisfactory condition.      Adolph Pollack, M.D.  Electronically Signed     TJR/MEDQ  D:  02/23/2008  T:  02/23/2008  Job:  [id number]   cc:   Georgina Quint. Plotnikov, MD  [address]  [place]  [place] [address]

## 2010-11-14 NOTE — H&P (Signed)
HISTORY AND PHYSICAL EXAMINATION   April 05, 2008   Re:  JAMARIOUS, FEBO         DOB:  10-Dec-1950   Date of planned hospital admission April 08, 2008.   REASON FOR CONSULTATION:  Severe 3-vessel coronary artery disease.   HISTORY OF PRESENT ILLNESS:  The patient is a 60 year old gentleman with  no previous history of coronary artery disease, but risk factors notable  for history of type 2 diabetes mellitus disease, hypertension, and  hyperlipidemia.  The patient's father had coronary artery disease at a  relatively young age.  The patient was in his usual state of health  until this past summer when he developed symptoms consistent with  cholelithiasis.  He was found to have gallstones and ultimately,  underwent laparoscopic cholecystectomy in August.  However, prior to his  surgery, he underwent a stress Myoview exam.  This exam was abnormal,  but felt to be low risk for acute cardiac event and he was cleared for  surgery.  He returned to see Dr. Myrtis Ser on March 31, 2008.  He  describes some intermittent chest discomfort with an episode of  exertional chest tightness in the distant past.  Ultimately, he  underwent elective cardiac catheterization by Dr. Gala Romney on April 01, 2008.  He was found to have severe 2-vessel coronary artery disease.  There was normal left ventricular function.  There was question raised  regarding the possibility of very mild aortic stenosis.  The patient has  now been referred for possible surgical revascularization.   REVIEW OF SYSTEMS:  General:  The patient reports stable appetite.  He  has actually lost 20 pounds weight over the last several months through  the combination of a diet and his recovery from cholecystectomy.  His  energy level is stable.  Cardiac:  The patient describes an episode when  he [place] the airport approximately a year ago and he  developed substernal chest tightness radiating  down his left arm.  He  has not had any similar episodes, although 2 weeks ago, he had transient  episode of aching pain across his left chest that occurred at rest.  At  the time, this was not associated with shortness of breath and he  thought that his symptoms were relieved by belching.  Ultimately after 2  hours, the pain subsided.  He has not had any further episodes of  similar discomfort since.  He denies exertional shortness of breath.  He  denies PND, orthopnea, or lower extremity edema.  Respiratory:  Notable  for the absence of any productive cough, hemoptysis, or wheezing.  Gastrointestinal:  Notable only for occasional mild constipation.  The  patient has no difficulty swallowing.  He denies hematochezia,  hematemesis, and melena.  Musculoskeletal:  Notable for mild  intermittent chronic low back pain.  This is not limiting.  Genitourinary:  Notable for some nocturia and difficulty starting and  stopping his stream.  These symptoms are stable and chronic.  Peripheral  Vascular:  Negative.  The patient denies symptoms suggestive of  claudication.  Neurologic:  Negative.  The patient denies symptoms  suggestive of previous TIA or stroke.  HEENT:  Negative.  Psychiatric:  Negative.   PAST MEDICAL HISTORY:  1. Hypertension.  2. Type 2 diabetes mellitus.  3. Hyperlipidemia.   PAST SURGICAL HISTORY:  1. Laparoscopic cholecystectomy on February 23, 2008, by Dr. Abbey Chatters.  2. Tonsillectomy, adenoidectomy, and UPP by Dr. Annalee Genta in 1999.  3. Appendectomy in 1977.   FAMILY HISTORY:  Notable that the patient's father developed angina and  had heart trouble in his 18s.   SOCIAL HISTORY:  The patient is married and lives with his wife here in  [place].  They have 5 children, 4 are full grown and out of the house  and 1 who is in [employer].  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  This requires fair amount of  travel.  The patient  does not live a very physically active life, but he  has had no specific physical limitations.  He is a nonsmoker and he  denies significant alcohol use.   CURRENT MEDICATIONS:  1. Enteric-coated aspirin 81 mg daily.  2. Glucophage 500 mg twice daily.  3. Calcium supplement 1 tablet daily.  4. Multivitamin 1 tablet daily.  5. Vytorin 10/80 one tablet daily.  6. Glucotrol XL 10 mg daily.  7. Vasotec 5 mg twice daily.  8. Wellbutrin 150 mg daily.  9. Crestor 2.5 mg daily.  10.Omega-3 fatty acid 1 tablet daily.  11.Saw Palmetto 450 mg twice daily.   DRUG ALLERGIES:  Sulfa causes rash and itching.   PHYSICAL EXAMINATION:  GENERAL:  The patient is a well-appearing male  who appears of stated age, in no acute distress.  HEENT:  Grossly unrevealing.  NECK:  Supple.  There is no cervical nor supraclavicular  lymphadenopathy.  There is no jugular venous distention.  CHEST:  Auscultation of the chest reveals clear breath sounds, which are  symmetrical bilaterally.  No wheezes or rhonchi are noted.  CARDIOVASCULAR:  Demonstrates regular rate and rhythm.  I do not  appreciate a murmur on physical exam.  ABDOMEN:  Mildly obese, soft, nondistended, and nontender.  There is a  well-healed surgical scars from recent laparoscopic cholecystectomy.  Bowel sounds are present.  EXTREMITIES:  Warm and well perfused.  There is no lower extremity  edema.  Distal pulses are palpable in the posterior tibial position  bilateral.  SKIN:  Clean, dry, and healthy-appearing throughout.  RECTAL AND GU:  Both deferred.   DIAGNOSTIC TESTS:  Cardiac catheterization performed by Dr. Gala Romney on  April 01, 2008, is reviewed.  This demonstrates severe 2-vessel  coronary artery disease with normal left ventricular function.  Specifically, there is heavily calcified 95% stenosis beginning at the  ostium of the LAD with a second 95% lesion in the midportion the vessel.  The distal left anterior descending  coronary artery may be  intramyocardial.  The left circumflex coronary artery gives rise to a  small-to-medium size first obtuse marginal branch, a small-to-medium-  sized second obtuse marginal branch, which may have 60-70% proximal  stenosis and a large third obtuse marginal branch as well as a  posterolateral branch.  There are collateral vessels off the left  circumflex coronary artery, which fill terminal branches of the distal  right coronary artery.  There is a 100% chronic occlusion of the right  coronary artery with left-to-right collateral filling in posterior  descending coronary artery and a posterolateral branch.  Left  ventricular function appears essentially normal.  By report, there was a  very mild gradient across the aortic valve estimated 8 mmHg.   IMPRESSION:  Severe 2- to 3-vessel coronary artery disease with abnormal  stress Myoview exam and recent episode of mild chest discomfort at rest  potentially consistent with angina pectoris.  The possibility of very  mild aortic stenosis was also  raised at the time of catheterization,  although I do not appreciate a murmur on physical exam in the office  today.  I believe that the patient would best be treated with elective  surgical revascularization.  We will plan transthoracic echocardiogram  preoperatively and transesophageal echocardiogram intraoperatively to  further assess the aortic valve.   PLAN:  I have discussed options at length with the patient and his wife  here in the office today.  Alternative treatment strategies for  management of coronary artery disease have been discussed.  They  understand and accept all potential associated risks of surgery  including, but not limited to risk of death, stroke, myocardial  infarction, congestive heart failure, respiratory failure, pneumonia,  bleeding requiring blood transfusion, arrhythmia, infection, and  recurrent coronary artery disease.  All of their questions  have been  addressed.  We tentatively plan to proceed with surgery on Thursday,  April 08, 2008.  We will obtain transthoracic echocardiogram as well as  routine preoperative laboratory data for screening preoperatively.   Salvatore Decent. Cornelius Moras, M.D.  Electronically Signed   CHO/MEDQ  D:  04/05/2008  T:  04/05/2008  Job:  [id number]   cc:   Bevelyn Buckles. Bensimhon, MD  Luis Abed, MD, [facility]  Georgina Quint. Plotnikov, MD

## 2010-11-17 NOTE — Procedures (Signed)
[facility]                                  PROCEDURE NOTE   NAME:BUCIOREmmett, Bracknell                      MRN:          [id number]  DATE:04/11/2006                            DOB:          [birth year]    PROCEDURE:  Cryosurgery.   INDICATION:  Actinic keratosis on the nose, right side, measuring about 4 x  2 mm.   The risks and benefits discussed with the patient in detail, and he agreed  to proceed.  The area was treated with liquid nitrogen in the usual fashion.  Tolerated well.   COMPLICATIONS:  None.            ______________________________  Georgina Quint. Plotnikov, MD      AVP/MedQ  DD:  04/11/2006  DT:  04/13/2006  Job #:  [id number]

## 2010-11-17 NOTE — Assessment & Plan Note (Signed)
[facility]                                 ON-CALL NOTE   NAME:[name], [name]                      MRN:          [id number]  DATE:08/10/2006                            DOB:          May 13, 1951    Patient called in because he thinks he has pneumonia.  He has had a  fever, chills, and a nonproductive cough for four days.  He is a  diabetic.  He also has underlying hypertension and hyperlipidemia.  He  said he had pneumonia when he was 60 years of age and was hospitalized.   Advised to come to the office for evaluation.     Jeffrey A. Tawanna Cooler, MD  Electronically Signed    JAT/MedQ  DD: 08/10/2006  DT: 08/10/2006  Job #: [id number]

## 2010-11-17 NOTE — Assessment & Plan Note (Signed)
[facility]                             PRIMARY CARE OFFICE NOTE   NAME:[name] [name], [name] [name]                      MRN:          [id number]  DATE:04/11/2006                            DOB:          01/22/1951    The patient is a 60 year old male who presents for a wellness examination.   ALLERGIES:  SULFA.   PAST MEDICAL HISTORY/FAMILY HISTORY/SOCIAL HISTORY:  As per February 23, 2005  note.  He is almost done with traveling this year.   CURRENT MEDICATIONS:  Reviewed with the patient.   REVIEW OF SYSTEMS:  He has lost some weight.  No chest pain or shortness of  breath.  No syncope.  No neurologic complaints.  No urinary trouble.  The  rest is negative.  Was constipated lately during prolonged trips.   PHYSICAL EXAMINATION:  VITAL SIGNS:  Blood pressure 117/79, pulse 79,  temperature 99.1, weight 222 pounds (was 228).  GENERAL:  Looks well.  HEENT:  Moist mucosa.  NECK:  Supple.  No thyromegaly or bruit.  LUNGS:  Clear to auscultation and percussion.  No wheezes or rales.  HEART:  S1 and S2.  No murmur, no gallop.  ABDOMEN:  Soft, nontender.  No masses felt.  LOWER EXTREMITIES:  With trace edema.  Pulses normal.  NEUROLOGIC:  Sensory exam normal.  GENITALIA:  Normal external genitalia.  RECTAL:  Normal prostate.  Stool guaiac negative.  No masses.   LABORATORY DATA:  On April 05, 2006, CBC normal, glucose 135.  CMET normal.  A1C of 6.0.  Cholesterol 140, LDL 71, HDL 30.  PSA normal.  TSH normal.  Urinalysis normal.  EKG normal.   ASSESSMENT AND PLAN:  1. Normal wellness examination.  Age/health related issues discussed.      Healthy lifestyle discussed.  Continue with weight loss.  Advised      regular exercise.  Repeat exam in 12 months.  2. Type 2 diabetes, better controlled.  Continue with current therapy.  3. Dyslipidemia.  Elevated triglycerides.  Overall, improved.  4. Flu shot provided.            ______________________________  Georgina Quint Plotnikov, MD      AVP/MedQ  DD:  04/11/2006  DT:  04/13/2006  Job #:  [id number]

## 2010-12-05 ENCOUNTER — Telehealth: Payer: Self-pay | Admitting: Cardiology

## 2010-12-05 NOTE — Telephone Encounter (Signed)
Pt spouse has some questions and concerns and wants to talk to someone pls don't call pt or other numbers pls call her at [phone]

## 2010-12-05 NOTE — Telephone Encounter (Signed)
Mailbox full unable to leave message.

## 2010-12-05 NOTE — Telephone Encounter (Signed)
LMTCB

## 2010-12-06 NOTE — Telephone Encounter (Signed)
Spoke with pt wife, pt has been cold and he has mentioned that he had bubbles in his chest. He also reports to wife neck and shoulder pain, he also reports a pain in a certain spot on his chest as he had prior to heart surgery. She wonders if the pt needs to be seen. He is in recalls for follow up in July. appt made for follow up with dr Nydia Bouton

## 2010-12-06 NOTE — Telephone Encounter (Signed)
Dr Myrtis Ser has no openings in his schedule at this time until July. Pt wife would like to heather when she returns next week. Pt wife would like the return call to be to the following number [phone]. Please do not any other numbers Deliah Goody

## 2010-12-11 ENCOUNTER — Other Ambulatory Visit: Payer: Self-pay | Admitting: Internal Medicine

## 2010-12-11 NOTE — Telephone Encounter (Signed)
Ok to Rf? 

## 2010-12-13 ENCOUNTER — Telehealth: Payer: Self-pay | Admitting: Internal Medicine

## 2010-12-13 MED ORDER — AMOXICILLIN 500 MG PO CAPS: ORAL_CAPSULE | ORAL | Status: AC

## 2010-12-13 NOTE — Telephone Encounter (Signed)
ok 

## 2010-12-13 NOTE — Telephone Encounter (Signed)
Left vm to check w/pharm

## 2010-12-13 NOTE — Telephone Encounter (Signed)
Pt having dental extractions and dental implants tomorrow at 11am. Dentist advised pt to call PCP to write prescription for Amoxicilin prior to procedure. [facility] [address]

## 2010-12-18 ENCOUNTER — Other Ambulatory Visit: Payer: Self-pay | Admitting: *Deleted

## 2010-12-18 DIAGNOSIS — N529 Male erectile dysfunction, unspecified: Secondary | ICD-10-CM

## 2010-12-18 MED ORDER — VARDENAFIL HCL 20 MG PO TABS: 10.0000 mg | ORAL_TABLET | Freq: Every day | ORAL | Status: DC | PRN

## 2010-12-18 NOTE — Telephone Encounter (Signed)
Please advise ok to Rf Levitra. Rec 2nd req from pharm

## 2010-12-18 NOTE — Telephone Encounter (Signed)
Ok to Rf cipro?

## 2010-12-19 ENCOUNTER — Telehealth: Payer: Self-pay | Admitting: *Deleted

## 2010-12-19 NOTE — Telephone Encounter (Signed)
He does not take NTG. He is aware of interaction. He knows not to take them w/in 24 h Ok to fill Thx

## 2010-12-19 NOTE — Telephone Encounter (Signed)
rec fax with drug interaction/contraindication between Levitra and Nitrostat 0.4mg . Please advise is it ok to fill Levitra since pt has Nitro?

## 2010-12-20 NOTE — Telephone Encounter (Signed)
Pharmacy informed.

## 2010-12-22 ENCOUNTER — Other Ambulatory Visit: Payer: Self-pay | Admitting: *Deleted

## 2010-12-22 MED ORDER — CIPROFLOXACIN HCL 500 MG PO TABS: 500.0000 mg | ORAL_TABLET | Freq: Two times a day (BID) | ORAL | Status: DC

## 2010-12-25 ENCOUNTER — Other Ambulatory Visit (INDEPENDENT_AMBULATORY_CARE_PROVIDER_SITE_OTHER): Payer: 59

## 2010-12-25 DIAGNOSIS — K219 Gastro-esophageal reflux disease without esophagitis: Secondary | ICD-10-CM

## 2010-12-25 DIAGNOSIS — E291 Testicular hypofunction: Secondary | ICD-10-CM

## 2010-12-25 DIAGNOSIS — I2510 Atherosclerotic heart disease of native coronary artery without angina pectoris: Secondary | ICD-10-CM

## 2010-12-25 LAB — BASIC METABOLIC PANEL
BUN: 18 mg/dL (ref 6–23)
Calcium: 8.8 mg/dL (ref 8.4–10.5)
Creatinine, Ser: 1.1 mg/dL (ref 0.4–1.5)
GFR: 72.53 mL/min (ref >60.00)

## 2010-12-25 LAB — LIPID PANEL
Cholesterol: 131 mg/dL (ref 0–200)
Triglycerides: 159 mg/dL — ABNORMAL HIGH (ref 0.0–149.0)

## 2010-12-29 ENCOUNTER — Ambulatory Visit (INDEPENDENT_AMBULATORY_CARE_PROVIDER_SITE_OTHER): Payer: 59 | Admitting: Internal Medicine

## 2010-12-29 ENCOUNTER — Encounter: Payer: Self-pay | Admitting: Internal Medicine

## 2010-12-29 DIAGNOSIS — E291 Testicular hypofunction: Secondary | ICD-10-CM

## 2010-12-29 DIAGNOSIS — E663 Overweight: Secondary | ICD-10-CM

## 2010-12-29 DIAGNOSIS — I2510 Atherosclerotic heart disease of native coronary artery without angina pectoris: Secondary | ICD-10-CM

## 2010-12-29 DIAGNOSIS — I10 Essential (primary) hypertension: Secondary | ICD-10-CM

## 2010-12-29 DIAGNOSIS — E119 Type 2 diabetes mellitus without complications: Secondary | ICD-10-CM

## 2010-12-29 NOTE — Assessment & Plan Note (Signed)
On Rx 

## 2010-12-29 NOTE — Assessment & Plan Note (Signed)
Wt Readings from Last 3 Encounters:  12/29/10 225 lb (102.059 kg)  09/22/10 226 lb (102.513 kg)  05/24/10 230 lb (104.327 kg)

## 2010-12-29 NOTE — Progress Notes (Signed)
  Subjective:    Patient ID: [name] [name], male    DOB: 1950-08-06, 60 y.o.   MRN: [id number]  HPI    Review of Systems  Constitutional: Positive for fatigue. Negative for appetite change and unexpected weight change.  HENT: Negative for nosebleeds, congestion, sore throat, sneezing, trouble swallowing and neck pain.   Eyes: Negative for itching and visual disturbance.  Respiratory: Negative for cough.   Cardiovascular: Negative for chest pain, palpitations and leg swelling.  Gastrointestinal: Negative for nausea, diarrhea, blood in stool and abdominal distention.  Genitourinary: Negative for frequency and hematuria.  Musculoskeletal: Negative for back pain, joint swelling and gait problem.  Skin: Negative for rash.  Neurological: Negative for dizziness, tremors, speech difficulty and weakness.  Psychiatric/Behavioral: Negative for sleep disturbance, dysphoric mood and agitation. The patient is not nervous/anxious.        Objective:   Physical Exam  Constitutional: He is oriented to person, place, and time. He appears well-developed.       Overweight  HENT:  Mouth/Throat: Oropharynx is clear and moist.  Eyes: Conjunctivae are normal. Pupils are equal, round, and reactive to light.  Neck: Normal range of motion. No JVD present. No thyromegaly present.  Cardiovascular: Normal rate, regular rhythm, normal heart sounds and intact distal pulses.  Exam reveals no gallop and no friction rub.   No murmur heard. Pulmonary/Chest: Effort normal and breath sounds normal. No respiratory distress. He has no wheezes. He has no rales. He exhibits no tenderness.  Abdominal: Soft. Bowel sounds are normal. He exhibits no distension and no mass. There is no tenderness. There is no rebound and no guarding.  Musculoskeletal: Normal range of motion. He exhibits no edema and no tenderness.  Lymphadenopathy:    He has no cervical adenopathy.  Neurological: He is alert and oriented to person, place, and  time. He has normal reflexes. No cranial nerve deficit. He exhibits normal muscle tone. Coordination normal.  Skin: Skin is warm and dry. No rash noted.  Psychiatric: He has a normal mood and affect. His behavior is normal. Judgment and thought content normal.       Lab Results  Component Value Date   WBC 6.7 05/08/2010   HGB 14.9 05/08/2010   HCT 42.7 05/08/2010   PLT 157.0 05/08/2010   CHOL 131 12/25/2010   TRIG 159.0* 12/25/2010   HDL 35.20* 12/25/2010   LDLDIRECT 100.8 05/08/2010   ALT 18 09/15/2010   AST 20 09/15/2010   NA 139 12/25/2010   K 4.0 12/25/2010   CL 106 12/25/2010   CREATININE 1.1 12/25/2010   BUN 18 12/25/2010   CO2 26 12/25/2010   TSH 1.77 05/08/2010   PSA 0.88 05/08/2010   INR 1.1 RATIO* 03/31/2008   HGBA1C 6.4 09/15/2010      Assessment & Plan:

## 2010-12-29 NOTE — Assessment & Plan Note (Signed)
He will try Androgel  Potential benefits of a long term androgel  use as well as potential risks  and complications were explained to the patient and were aknowledged.

## 2011-01-09 NOTE — Telephone Encounter (Signed)
Have been unable to reach wife, pt was seen by pcp on 6/29

## 2011-01-17 ENCOUNTER — Other Ambulatory Visit: Payer: Self-pay | Admitting: Internal Medicine

## 2011-01-17 NOTE — Telephone Encounter (Signed)
[name] [name]  60 y.o. / Male ([birth year])  Pharmacy: [id number] [place], [place] - [address] Ph: [phone]   MRN: [id number]  PCP: Sonda Primes, MD  Wt: 225 lb (102.059 kg) (12/29/2010)   Home: [phone]  Work: [phone]  Mobile: [phone]       Requested Medications     ciprofloxacin (CIPRO) 500 MG tablet [Pharmacy Med Name: CIPROFLOXACIN HCL 500 MG TAB]    TAKE 1 TABLET BY MOUTH TWO TIMES A DAY (USES WHEN TRAVELING OUT OF COUNTRY)    Disp: 20 tablet R: 3 Start: 01/17/2011 Class: Normal    Originally ordered on: 08/31/2010

## 2011-01-17 NOTE — Telephone Encounter (Signed)
Pt's wife called requesting callback regarding prescriptions that need to be refilled before pt leaves out of the country

## 2011-01-18 NOTE — Telephone Encounter (Signed)
Ok to Rf? 

## 2011-01-19 ENCOUNTER — Telehealth: Payer: Self-pay | Admitting: *Deleted

## 2011-01-19 MED ORDER — MECLIZINE HCL 12.5 MG PO TABS: 12.5000 mg | ORAL_TABLET | Freq: Three times a day (TID) | ORAL | Status: DC | PRN

## 2011-01-19 NOTE — Telephone Encounter (Signed)
Request for Meclizine 12.5 mg tablets. Pt traveling to [place], [place] Ok per VO Dr Posey Rea for #30 Rx done to [address] per caller.

## 2011-01-22 ENCOUNTER — Telehealth: Payer: Self-pay | Admitting: *Deleted

## 2011-01-22 NOTE — Telephone Encounter (Signed)
OK to fill both prescriptions with additional refills x1 Thank you!  

## 2011-01-22 NOTE — Telephone Encounter (Signed)
Ok to fill Cipro 500mg  1 bid, And also meclizine??

## 2011-01-23 MED ORDER — MECLIZINE HCL 12.5 MG PO TABS: 12.5000 mg | ORAL_TABLET | Freq: Three times a day (TID) | ORAL | Status: DC | PRN

## 2011-01-23 MED ORDER — CIPROFLOXACIN HCL 500 MG PO TABS: 500.0000 mg | ORAL_TABLET | Freq: Two times a day (BID) | ORAL | Status: DC

## 2011-02-19 ENCOUNTER — Ambulatory Visit (INDEPENDENT_AMBULATORY_CARE_PROVIDER_SITE_OTHER): Payer: 59 | Admitting: Internal Medicine

## 2011-02-19 ENCOUNTER — Encounter: Payer: Self-pay | Admitting: Internal Medicine

## 2011-02-19 DIAGNOSIS — H1030 Unspecified acute conjunctivitis, unspecified eye: Secondary | ICD-10-CM | POA: Insufficient documentation

## 2011-02-19 DIAGNOSIS — E119 Type 2 diabetes mellitus without complications: Secondary | ICD-10-CM

## 2011-02-19 DIAGNOSIS — J209 Acute bronchitis, unspecified: Secondary | ICD-10-CM

## 2011-02-19 DIAGNOSIS — I10 Essential (primary) hypertension: Secondary | ICD-10-CM

## 2011-02-19 DIAGNOSIS — Z0000 Encounter for general adult medical examination without abnormal findings: Secondary | ICD-10-CM | POA: Insufficient documentation

## 2011-02-19 MED ORDER — LEVOFLOXACIN 500 MG PO TABS: 500.0000 mg | ORAL_TABLET | Freq: Every day | ORAL | Status: AC

## 2011-02-19 MED ORDER — TOBRAMYCIN 0.3 % OP SOLN: 1.0000 [drp] | Freq: Four times a day (QID) | OPHTHALMIC | Status: AC

## 2011-02-19 MED ORDER — HYDROCODONE-HOMATROPINE 5-1.5 MG/5ML PO SYRP: 5.0000 mL | ORAL_SOLUTION | Freq: Four times a day (QID) | ORAL | Status: DC | PRN

## 2011-02-19 NOTE — Assessment & Plan Note (Signed)
Mild to mod, for antibx course,  to f/u any worsening symptoms or concerns 

## 2011-02-19 NOTE — Patient Instructions (Signed)
Take all new medications as prescribed Continue all other medications as before  

## 2011-02-19 NOTE — Assessment & Plan Note (Signed)
stable overall by hx and exam, most recent data reviewed with pt, and pt to continue medical treatment as before  Lab Results  Component Value Date   HGBA1C 6.4 09/15/2010

## 2011-02-19 NOTE — Progress Notes (Signed)
Subjective:    Patient ID: [name] [name], male    DOB: [birth year], 60 y.o.   MRN: [id number]  HPI  Here with acute onset mild to mod 2-3 days ST, HA, general weakness and malaise, with prod cough greenish sputum, but Pt denies chest pain, increased sob or doe, wheezing, orthopnea, PND, increased LE swelling, palpitations, dizziness or syncope.  Also with bilat eye inflammation with cloudy drainage, worse in the am without mild pain, swelling, but no vision loss, hearing loss;  Has had fatigue, a few chills on the first day but none since; Pt denies new neurological symptoms such as new headache, or facial or extremity weakness or numbness   Pt denies polydipsia, polyuria.   Pt denies  wt loss, night sweats, loss of appetite, or other constitutional symptoms Past Medical History  Diagnosis Date  . Diabetes mellitus   . Hyperlipidemia   . Snoring     prior history of surgery for sleep apnea  . Microalbuminuria   . Herpes simplex   . CAD (coronary artery disease)     Nuclear, Jul 04, 2009 NO ischemia  . Hx of CABG 04/2008  . Aortic root dilatation     slight, echo 2009  . Low testosterone 2010    LOW DHEA   Past Surgical History  Procedure Date  . Appendectomy 1978  . Tonsilectomy, adenoidectomy, bilateral myringotomy and tubes   . Removal of uvula     ENT  . Cholecystectomy 2009  . Coronary artery bypass graft 2009    reports that he has never smoked. He does not have any smokeless tobacco history on file. He reports that he does not drink alcohol or use illicit drugs. family history includes Diabetes in his father and mother; Heart disease in his father; Hypertension in an unspecified family member; and Mental illness (age of onset:79) in his mother. Allergies  Allergen Reactions  . Iohexol      Desc: itching and hives   . Sulfadiazine    Current Outpatient Prescriptions on File Prior to Visit  Medication Sig Dispense Refill  . aspirin 325 MG tablet Take 325 mg by mouth  daily.        Marland Kitchen atovaquone-proguanil (MALARONE) 250-100 MG TABS Take 1 tablet by mouth as directed. One daily, start 1 day prior to departure, continue daily while in the country and 7 days after arrival       . buPROPion (WELLBUTRIN XL) 150 MG 24 hr tablet Take 150 mg by mouth daily.        . Cholecalciferol 1000 UNITS tablet Take 1,000 Units by mouth daily.        . Diclofenac Sodium (PENNSAID) 1.5 % SOLN Place 3-5 drops onto the skin 3 (three) times daily as needed. For pain       . diphenhydrAMINE (BENADRYL) 25 MG tablet Take 25-50 mg by mouth 2 (two) times daily as needed.        . enalapril (VASOTEC) 5 MG tablet Take 10 mg by mouth daily.        Marland Kitchen ezetimibe-simvastatin (VYTORIN) 10-80 MG per tablet Take 1 tablet by mouth at bedtime.        Marland Kitchen glipiZIDE (GLUCOTROL) 5 MG 24 hr tablet Take 5 mg by mouth daily.        Marland Kitchen glucose blood (ONE TOUCH ULTRA TEST) test strip 1 each by Other route 2 (two) times daily. Use as instructed       . LEVITRA 20 MG tablet  TAKE 1/2 TO 1 TABLET DAILY AS NEEDED  10 tablet  0  . lubiprostone (AMITIZA) 24 MCG capsule Take 24-48 mcg by mouth daily as needed. For constipation       . meclizine (ANTIVERT) 12.5 MG tablet Take 1 tablet (12.5 mg total) by mouth 3 (three) times daily as needed for dizziness or nausea.  30 tablet  1  . metFORMIN (GLUCOPHAGE) 1000 MG tablet Take 1,000 mg by mouth 2 (two) times daily with a meal. For diabetes       . naproxen (NAPROSYN) 500 MG tablet Take 500 mg by mouth 2 (two) times daily as needed.        . niacin (NIASPAN) 500 MG CR tablet Take 500 mg by mouth at bedtime.        . nitroGLYCERIN (NITROLINGUAL) 0.4 MG/SPRAY spray Place 1 spray under the tongue as directed.        . OMEGA 3-6-9 FATTY ACIDS PO daily.        . ONE TOUCH LANCETS MISC 2 (two) times daily.        . promethazine (PHENERGAN) 25 MG tablet TAKE 1 TO 2 TABLETS 4 TIMES A DAY AS NEEDED FOR NAUSEA  60 tablet  1  . Testosterone (ANDROGEL PUMP) 1.25 GM/ACT (1%) GEL Place 10  g onto the skin every morning.        . valACYclovir (VALTREX) 500 MG tablet TAKE 1 TABLET 3 TIMES A DAY FOR 7 DAYS AS NEEDED FOR HERPES  21 tablet  2  . vardenafil (LEVITRA) 20 MG tablet Take 10-20 mg by mouth daily as needed.        Marland Kitchen DISCONTD: ciprofloxacin (CIPRO) 500 MG tablet TAKE 1 TABLET BY MOUTH TWO TIMES A DAY (USES WHEN TRAVELING OUT OF COUNTRY)  20 tablet  3  . DISCONTD: meclizine (ANTIVERT) 12.5 MG tablet Take 1 tablet (12.5 mg total) by mouth 3 (three) times daily as needed for dizziness or nausea.  30 tablet  0  . ciprofloxacin (CIPRO) 500 MG tablet Take 1 tablet (500 mg total) by mouth 2 (two) times daily.  20 tablet  1   Review of Systems Review of Systems  Constitutional: Negative for diaphoresis and unexpected weight change.  HENT: Negative for drooling and tinnitus.   Eyes: Negative for photophobia and visual disturbance.  Respiratory: Negative for choking and stridor.   Gastrointestinal: Negative for vomiting and blood in stool.  Genitourinary: Negative for hematuria and decreased urine volume.     Objective:   Physical Exam BP 124/70  Pulse 76  Temp(Src) 98.9 F (37.2 C) (Oral)  Ht 6\' 1"  (1.854 m)  Wt 225 lb (102.059 kg)  BMI 29.69 kg/m2  SpO2 96% Physical Exam  VS noted, mild ill Constitutional: Pt appears well-developed and well-nourished.  HENT: Head: Normocephalic.  Right Ear: External ear normal.  Left Ear: External ear normal.  Bilat tm's mild erythema.  Sinus nontender.  Pharynx mild erythema Eyes: Conjunctivae and EOM are normal. Pupils are equal, round, and reactive to light.  Neck: Normal range of motion. Neck supple.  Cardiovascular: Normal rate and regular rhythm.   Pulmonary/Chest: Effort normal and breath sounds without rales, wheezing.  Neurological: Pt is alert. No cranial nerve deficit.  Skin: Skin is warm. No erythema.  Psychiatric: Pt behavior is normal. Thought content normal.         Assessment & Plan:

## 2011-02-19 NOTE — Assessment & Plan Note (Signed)
stable overall by hx and exam, most recent data reviewed with pt, and pt to continue medical treatment as before  BP Readings from Last 3 Encounters:  02/19/11 124/70  12/29/10 140/60  09/22/10 140/80

## 2011-02-27 ENCOUNTER — Ambulatory Visit (INDEPENDENT_AMBULATORY_CARE_PROVIDER_SITE_OTHER)
Admission: RE | Admit: 2011-02-27 | Discharge: 2011-02-27 | Disposition: A | Discharge status: Home / Self Care | Payer: 59 | Source: Ambulatory Visit | Attending: Internal Medicine | Admitting: Internal Medicine

## 2011-02-27 ENCOUNTER — Telehealth: Payer: Self-pay | Admitting: Internal Medicine

## 2011-02-27 ENCOUNTER — Encounter: Payer: Self-pay | Admitting: Internal Medicine

## 2011-02-27 ENCOUNTER — Ambulatory Visit (INDEPENDENT_AMBULATORY_CARE_PROVIDER_SITE_OTHER): Payer: 59 | Admitting: Internal Medicine

## 2011-02-27 VITALS — BP 138/72 | HR 76 | Temp 99.2°F | Resp 16 | Wt 225.0 lb

## 2011-02-27 DIAGNOSIS — J209 Acute bronchitis, unspecified: Secondary | ICD-10-CM

## 2011-02-27 DIAGNOSIS — R05 Cough: Secondary | ICD-10-CM

## 2011-02-27 MED ORDER — HYDROCODONE-HOMATROPINE 5-1.5 MG/5ML PO SYRP: 5.0000 mL | ORAL_SOLUTION | Freq: Four times a day (QID) | ORAL | Status: AC | PRN

## 2011-02-27 MED ORDER — BUDESONIDE-FORMOTEROL FUMARATE 160-4.5 MCG/ACT IN AERO: 2.0000 | INHALATION_SPRAY | Freq: Two times a day (BID) | RESPIRATORY_TRACT | Status: DC

## 2011-02-27 NOTE — Telephone Encounter (Signed)
Misty Stanley, please, inform patient that his CXR was clear Thx

## 2011-02-27 NOTE — Progress Notes (Signed)
  Subjective:    Patient ID: [name] [name], male    DOB: 16-Jan-1951, 61 y.o.   MRN: [id number]  HPI  C/o cough x 2 wks and R side pain and cough. C/o chills  Review of Systems  Constitutional: Positive for chills. Negative for appetite change, fatigue and unexpected weight change.  HENT: Positive for congestion. Negative for nosebleeds, sore throat, sneezing, trouble swallowing and neck pain.   Eyes: Negative for itching and visual disturbance.  Respiratory: Positive for cough.   Cardiovascular: Positive for chest pain (R chest). Negative for palpitations and leg swelling.  Gastrointestinal: Negative for nausea, diarrhea, blood in stool and abdominal distention.  Genitourinary: Negative for frequency and hematuria.  Musculoskeletal: Negative for back pain, joint swelling and gait problem.  Skin: Negative for rash.  Neurological: Negative for dizziness, tremors, speech difficulty and weakness.  Psychiatric/Behavioral: Negative for sleep disturbance, dysphoric mood and agitation. The patient is not nervous/anxious.        Objective:   Physical Exam  Constitutional: He is oriented to person, place, and time. He appears well-developed.  HENT:  Mouth/Throat: Oropharynx is clear and moist.  Eyes: Conjunctivae are normal. Pupils are equal, round, and reactive to light.  Neck: Normal range of motion. No JVD present. No thyromegaly present.  Cardiovascular: Normal rate, regular rhythm, normal heart sounds and intact distal pulses.  Exam reveals no gallop and no friction rub.   No murmur heard. Pulmonary/Chest: Effort normal and breath sounds normal. No respiratory distress. He has no wheezes. He has no rales. He exhibits no tenderness.  Abdominal: Soft. Bowel sounds are normal. He exhibits no distension and no mass. There is no tenderness. There is no rebound and no guarding.  Musculoskeletal: Normal range of motion. He exhibits no edema and no tenderness.  Lymphadenopathy:    He has no  cervical adenopathy.  Neurological: He is alert and oriented to person, place, and time. He has normal reflexes. No cranial nerve deficit. He exhibits normal muscle tone. Coordination normal.  Skin: Skin is warm and dry. No rash noted.  Psychiatric: He has a normal mood and affect. His behavior is normal. Judgment and thought content normal.          Assessment & Plan:

## 2011-02-27 NOTE — Patient Instructions (Signed)
Use Micardis (one a day) in place of Enalapril x 2-4 wks

## 2011-02-28 NOTE — Telephone Encounter (Signed)
Left detailed mess informing pt of below.  

## 2011-03-02 ENCOUNTER — Encounter: Payer: Self-pay | Admitting: Internal Medicine

## 2011-03-02 NOTE — Assessment & Plan Note (Signed)
Obtained CXR Cont with current meds Added Symbicort for possible bronchospasm

## 2011-03-09 ENCOUNTER — Other Ambulatory Visit: Payer: Self-pay | Admitting: Internal Medicine

## 2011-04-03 ENCOUNTER — Other Ambulatory Visit: Payer: Self-pay | Admitting: *Deleted

## 2011-04-03 LAB — POCT I-STAT 3, ART BLOOD GAS (G3+)
Acid-base deficit: 1
Acid-base deficit: 1
Acid-base deficit: 2
Bicarbonate: 22.2
Bicarbonate: 24.3 — ABNORMAL HIGH
Bicarbonate: 24.7 — ABNORMAL HIGH
Bicarbonate: 25.3 — ABNORMAL HIGH
O2 Saturation: 100
O2 Saturation: 94
Patient temperature: 37.2
TCO2: 23
TCO2: 26
TCO2: 26
pCO2 arterial: 25 — ABNORMAL LOW
pCO2 arterial: 41
pH, Arterial: 7.353
pH, Arterial: 7.556 — ABNORMAL HIGH
pO2, Arterial: 375 — ABNORMAL HIGH
pO2, Arterial: 65 — ABNORMAL LOW

## 2011-04-03 LAB — COMPREHENSIVE METABOLIC PANEL
AST: 19
Albumin: 4
Chloride: 103
Creatinine, Ser: 1.14
GFR calc Af Amer: >60
Potassium: 4.1
Total Bilirubin: 0.8

## 2011-04-03 LAB — POCT I-STAT 4, (NA,K, GLUC, HGB,HCT)
Glucose, Bld: 123 — ABNORMAL HIGH
Glucose, Bld: 126 — ABNORMAL HIGH
Glucose, Bld: 141 — ABNORMAL HIGH
HCT: 27 — ABNORMAL LOW
HCT: 27 — ABNORMAL LOW
HCT: 35 — ABNORMAL LOW
Hemoglobin: 11.9 — ABNORMAL LOW
Hemoglobin: 8.8 — ABNORMAL LOW
Hemoglobin: 9.2 — ABNORMAL LOW
Potassium: 3.9
Potassium: 4.6
Sodium: 135
Sodium: 136
Sodium: 137
Sodium: 140

## 2011-04-03 LAB — BASIC METABOLIC PANEL
BUN: 10
BUN: 7
CO2: 22
CO2: 27
CO2: 27
CO2: 30
Calcium: 8.1 — ABNORMAL LOW
Calcium: 8.3 — ABNORMAL LOW
Calcium: 9.5
Chloride: 102
Chloride: 107
Chloride: 99
Creatinine, Ser: 1
Creatinine, Ser: 1.05
GFR calc Af Amer: >60
GFR calc Af Amer: >60
GFR calc Af Amer: >60
GFR calc Af Amer: >60
GFR calc Af Amer: >60
GFR calc non Af Amer: >60
GFR calc non Af Amer: >60
Glucose, Bld: 135 — ABNORMAL HIGH
Glucose, Bld: 146 — ABNORMAL HIGH
Potassium: 3.8
Sodium: 134 — ABNORMAL LOW
Sodium: 134 — ABNORMAL LOW
Sodium: 135
Sodium: 140
Sodium: 143

## 2011-04-03 LAB — POCT I-STAT 3, VENOUS BLOOD GAS (G3P V)
Bicarbonate: 25.1 — ABNORMAL HIGH
TCO2: 27
pCO2, Ven: 57.3 — ABNORMAL HIGH
pH, Ven: 7.25

## 2011-04-03 LAB — PLATELET COUNT: Platelets: 111 — ABNORMAL LOW

## 2011-04-03 LAB — CBC
HCT: 33.3 — ABNORMAL LOW
HCT: 35.1 — ABNORMAL LOW
HCT: 35.9 — ABNORMAL LOW
HCT: 42.3
Hemoglobin: 11.3 — ABNORMAL LOW
Hemoglobin: 11.7 — ABNORMAL LOW
Hemoglobin: 12.5 — ABNORMAL LOW
Hemoglobin: 14.5
Hemoglobin: 15.1
MCHC: 34.2
MCHC: 34.3
MCHC: 35
MCV: 90.3
MCV: 91.1
MCV: 91.3
MCV: 93.2
Platelets: 107 — ABNORMAL LOW
Platelets: 113 — ABNORMAL LOW
Platelets: 120 — ABNORMAL LOW
Platelets: 182
RBC: 4.54
RBC: 4.81
RDW: 12.9
RDW: 13
RDW: 13
RDW: 13.4
RDW: 13.5
WBC: 10.2
WBC: 10.8 — ABNORMAL HIGH
WBC: 11.5 — ABNORMAL HIGH
WBC: 6.8

## 2011-04-03 LAB — POCT I-STAT, CHEM 8
Calcium, Ion: 1.14
Chloride: 105
Glucose, Bld: 134 — ABNORMAL HIGH
HCT: 34 — ABNORMAL LOW
Hemoglobin: 11.6 — ABNORMAL LOW
Potassium: 3.8

## 2011-04-03 LAB — URINALYSIS, ROUTINE W REFLEX MICROSCOPIC
Bilirubin Urine: NEGATIVE
Ketones, ur: NEGATIVE
Nitrite: NEGATIVE
Protein, ur: NEGATIVE
Urobilinogen, UA: 0.2

## 2011-04-03 LAB — BLOOD GAS, ARTERIAL
Bicarbonate: 21.9
TCO2: 22.7
pCO2 arterial: 27 — ABNORMAL LOW
pH, Arterial: 7.519 — ABNORMAL HIGH
pO2, Arterial: 98.3

## 2011-04-03 LAB — GLUCOSE, CAPILLARY
Glucose-Capillary: 100 — ABNORMAL HIGH
Glucose-Capillary: 116 — ABNORMAL HIGH
Glucose-Capillary: 121 — ABNORMAL HIGH
Glucose-Capillary: 122 — ABNORMAL HIGH
Glucose-Capillary: 142 — ABNORMAL HIGH
Glucose-Capillary: 145 — ABNORMAL HIGH
Glucose-Capillary: 150 — ABNORMAL HIGH
Glucose-Capillary: 181 — ABNORMAL HIGH
Glucose-Capillary: 195 — ABNORMAL HIGH
Glucose-Capillary: 239 — ABNORMAL HIGH
Glucose-Capillary: 97
Glucose-Capillary: 109 mg/dL — ABNORMAL HIGH (ref 70–99)
Glucose-Capillary: 118 mg/dL — ABNORMAL HIGH (ref 70–99)
Glucose-Capillary: 70 mg/dL (ref 70–99)
Glucose-Capillary: 76 mg/dL (ref 70–99)
Glucose-Capillary: 98 mg/dL (ref 70–99)

## 2011-04-03 LAB — PROTIME-INR
INR: 1.1
INR: 1.3
Prothrombin Time: 14.3
Prothrombin Time: 16.8 — ABNORMAL HIGH

## 2011-04-03 LAB — HEMOGLOBIN AND HEMATOCRIT, BLOOD: HCT: 29 — ABNORMAL LOW

## 2011-04-03 LAB — CREATININE, SERUM
Creatinine, Ser: 0.86
GFR calc non Af Amer: >60

## 2011-04-03 LAB — CARDIAC PANEL(CRET KIN+CKTOT+MB+TROPI)
CK, MB: 1.6
Total CK: 72
Total CK: 94
Troponin I: <0.01

## 2011-04-03 LAB — POCT CARDIAC MARKERS: Myoglobin, poc: 85.1

## 2011-04-03 LAB — MAGNESIUM: Magnesium: 2.9 — ABNORMAL HIGH

## 2011-04-03 LAB — CK TOTAL AND CKMB (NOT AT ARMC)
CK, MB: 2
Relative Index: 1.9

## 2011-04-03 LAB — APTT: aPTT: 30

## 2011-05-04 ENCOUNTER — Other Ambulatory Visit (INDEPENDENT_AMBULATORY_CARE_PROVIDER_SITE_OTHER): Payer: 59

## 2011-05-04 DIAGNOSIS — E119 Type 2 diabetes mellitus without complications: Secondary | ICD-10-CM

## 2011-05-04 DIAGNOSIS — I2510 Atherosclerotic heart disease of native coronary artery without angina pectoris: Secondary | ICD-10-CM

## 2011-05-04 DIAGNOSIS — E291 Testicular hypofunction: Secondary | ICD-10-CM

## 2011-05-04 LAB — COMPREHENSIVE METABOLIC PANEL
ALT: 19 U/L (ref 0–53)
AST: 19 U/L (ref 0–37)
Albumin: 4.3 g/dL (ref 3.5–5.2)
Alkaline Phosphatase: 38 U/L — ABNORMAL LOW (ref 39–117)
BUN: 19 mg/dL (ref 6–23)
CO2: 27 mEq/L (ref 19–32)
Calcium: 9.7 mg/dL (ref 8.4–10.5)
Chloride: 104 mEq/L (ref 96–112)
Creatinine, Ser: 1.1 mg/dL (ref 0.4–1.5)
GFR: 70.23 mL/min (ref >60.00)
Glucose, Bld: 136 mg/dL — ABNORMAL HIGH (ref 70–99)
Potassium: 4.5 mEq/L (ref 3.5–5.1)
Sodium: 141 mEq/L (ref 135–145)
Total Bilirubin: 0.9 mg/dL (ref 0.3–1.2)
Total Protein: 7.1 g/dL (ref 6.0–8.3)

## 2011-05-04 LAB — TESTOSTERONE: Testosterone: 361.08 ng/dL (ref 350.00–890.00)

## 2011-05-11 ENCOUNTER — Other Ambulatory Visit: Payer: Self-pay | Admitting: Internal Medicine

## 2011-05-11 ENCOUNTER — Encounter: Payer: Self-pay | Admitting: Internal Medicine

## 2011-05-11 ENCOUNTER — Ambulatory Visit (INDEPENDENT_AMBULATORY_CARE_PROVIDER_SITE_OTHER): Payer: 59 | Admitting: Internal Medicine

## 2011-05-11 VITALS — BP 120/68 | HR 88 | Temp 98.5°F | Resp 16 | Wt 224.0 lb

## 2011-05-11 DIAGNOSIS — E291 Testicular hypofunction: Secondary | ICD-10-CM

## 2011-05-11 DIAGNOSIS — E785 Hyperlipidemia, unspecified: Secondary | ICD-10-CM

## 2011-05-11 DIAGNOSIS — E119 Type 2 diabetes mellitus without complications: Secondary | ICD-10-CM

## 2011-05-11 DIAGNOSIS — Z23 Encounter for immunization: Secondary | ICD-10-CM

## 2011-05-11 DIAGNOSIS — Z951 Presence of aortocoronary bypass graft: Secondary | ICD-10-CM

## 2011-05-11 DIAGNOSIS — I10 Essential (primary) hypertension: Secondary | ICD-10-CM

## 2011-05-11 MED ORDER — ENALAPRIL MALEATE 5 MG PO TABS: 10.0000 mg | ORAL_TABLET | Freq: Every day | ORAL | Status: DC

## 2011-05-11 MED ORDER — ONETOUCH LANCETS MISC: 1.0000 | Freq: Two times a day (BID) | Status: DC

## 2011-05-11 MED ORDER — GLUCOSE BLOOD VI STRP: ORAL_STRIP | Status: DC

## 2011-05-11 MED ORDER — BUPROPION HCL ER (XL) 150 MG PO TB24: 150.0000 mg | ORAL_TABLET | Freq: Every day | ORAL | Status: DC

## 2011-05-11 MED ORDER — GLIPIZIDE ER 5 MG PO TB24: 5.0000 mg | ORAL_TABLET | Freq: Every day | ORAL | Status: DC

## 2011-05-11 MED ORDER — EZETIMIBE-SIMVASTATIN 10-80 MG PO TABS: 1.0000 | ORAL_TABLET | Freq: Every day | ORAL | Status: DC

## 2011-05-11 MED ORDER — METFORMIN HCL 1000 MG PO TABS: 1000.0000 mg | ORAL_TABLET | Freq: Two times a day (BID) | ORAL | Status: DC

## 2011-05-11 MED ORDER — NIACIN ER (ANTIHYPERLIPIDEMIC) 500 MG PO TBCR: 500.0000 mg | EXTENDED_RELEASE_TABLET | Freq: Every day | ORAL | Status: DC

## 2011-05-11 MED ORDER — VALACYCLOVIR HCL 500 MG PO TABS: 500.0000 mg | ORAL_TABLET | Freq: Three times a day (TID) | ORAL | Status: DC

## 2011-05-11 NOTE — Assessment & Plan Note (Signed)
Continue with current prescription therapy as reflected on the Med list.  

## 2011-05-11 NOTE — Progress Notes (Signed)
  Subjective:    Patient ID: [name] [name]., male    DOB: 1951-02-06, 60 y.o.   MRN: [id number]  HPI The patient presents for a follow-up of  chronic hypertension, chronic dyslipidemia, hypogonadism, type 2 diabetes controlled with medicines.      Review of Systems  Constitutional: Negative for appetite change, fatigue and unexpected weight change.  HENT: Negative for nosebleeds, congestion, sore throat, sneezing, trouble swallowing and neck pain.   Eyes: Negative for itching and visual disturbance.  Respiratory: Negative for cough.   Cardiovascular: Negative for chest pain, palpitations and leg swelling.  Gastrointestinal: Negative for nausea, diarrhea, blood in stool and abdominal distention.  Genitourinary: Negative for frequency and hematuria.  Musculoskeletal: Negative for back pain, joint swelling and gait problem.  Skin: Negative for rash.  Neurological: Negative for dizziness, tremors, speech difficulty and weakness.  Psychiatric/Behavioral: Negative for suicidal ideas, sleep disturbance, dysphoric mood and agitation. The patient is not nervous/anxious.    Wt Readings from Last 3 Encounters:  05/11/11 224 lb (101.606 kg)  02/27/11 225 lb (102.059 kg)  02/19/11 225 lb (102.059 kg)       Objective:   Physical Exam  Vitals reviewed. Constitutional: He is oriented to person, place, and time. He appears well-developed.  HENT:  Mouth/Throat: Oropharynx is clear and moist.  Eyes: Conjunctivae are normal. Pupils are equal, round, and reactive to light.  Neck: Normal range of motion. No JVD present. No thyromegaly present.  Cardiovascular: Normal rate, regular rhythm, normal heart sounds and intact distal pulses.  Exam reveals no gallop and no friction rub.   No murmur heard. Pulmonary/Chest: Effort normal and breath sounds normal. No respiratory distress. He has no wheezes. He has no rales. He exhibits no tenderness.  Abdominal: Soft. Bowel sounds are normal. He  exhibits no distension and no mass. There is no tenderness. There is no rebound and no guarding.  Musculoskeletal: Normal range of motion. He exhibits no edema and no tenderness.  Lymphadenopathy:    He has no cervical adenopathy.  Neurological: He is alert and oriented to person, place, and time. He has normal reflexes. No cranial nerve deficit. He exhibits normal muscle tone. Coordination normal.  Skin: Skin is warm and dry. No rash noted.  Psychiatric: He has a normal mood and affect. His behavior is normal. Judgment and thought content normal.    Lab Results  Component Value Date   WBC 6.7 05/08/2010   HGB 14.9 05/08/2010   HCT 42.7 05/08/2010   PLT 157.0 05/08/2010   GLUCOSE 136* 05/04/2011   CHOL 131 12/25/2010   TRIG 159.0* 12/25/2010   HDL 35.20* 12/25/2010   LDLDIRECT 100.8 05/08/2010   LDLCALC 64 12/25/2010   ALT 19 05/04/2011   AST 19 05/04/2011   NA 141 05/04/2011   K 4.5 05/04/2011   CL 104 05/04/2011   CREATININE 1.1 05/04/2011   BUN 19 05/04/2011   CO2 27 05/04/2011   TSH 1.77 05/08/2010   PSA 0.88 05/08/2010   INR 1.3 04/08/2008   HGBA1C 6.5 05/04/2011         Assessment & Plan:

## 2011-05-16 ENCOUNTER — Encounter: Payer: Self-pay | Admitting: Cardiology

## 2011-05-17 ENCOUNTER — Telehealth: Payer: Self-pay | Admitting: Cardiology

## 2011-05-17 ENCOUNTER — Ambulatory Visit (INDEPENDENT_AMBULATORY_CARE_PROVIDER_SITE_OTHER): Payer: 59 | Admitting: Cardiology

## 2011-05-17 ENCOUNTER — Encounter: Payer: Self-pay | Admitting: Cardiology

## 2011-05-17 DIAGNOSIS — I2510 Atherosclerotic heart disease of native coronary artery without angina pectoris: Secondary | ICD-10-CM

## 2011-05-17 DIAGNOSIS — E663 Overweight: Secondary | ICD-10-CM

## 2011-05-17 DIAGNOSIS — I10 Essential (primary) hypertension: Secondary | ICD-10-CM

## 2011-05-17 NOTE — Assessment & Plan Note (Signed)
Patient has gained substantial weight over time.  We discussed this at length and he needs to lose weight.

## 2011-05-17 NOTE — Assessment & Plan Note (Signed)
Coronary disease is stable.  The patient has rare and infrequent atypical chest discomfort.  He had a nuclear scan in January, 2011 that showed no ischemia.  He is stable post CABG from 2009.  He does not need any further testing.  However he clearly needs to lose weight.

## 2011-05-17 NOTE — Telephone Encounter (Signed)
Pt wife calling wanting to speak to nurse to inform MD about husband prior to appt today at 3:15pm. Please return pt wife to discuss further.

## 2011-05-17 NOTE — Telephone Encounter (Signed)
Dr Myrtis Ser is aware of this prior to seeing the patient

## 2011-05-17 NOTE — Telephone Encounter (Signed)
Pt's wife calling back to let Myrtis Ser know he had quite a lot of symptoms this summer that she thinks he had a heart attack he didn't tell her till a lot later, he is also having trouble lifting or pulling things, has a pressure type feeling when he does it, he felt that he didn't need appt today or ever again because he was fine, but wife saw recall letter so he went ahead and made it, but pt's wife asking to please not say anything to pt that she called but felt dr Myrtis Ser needed to know what was going on

## 2011-05-17 NOTE — Progress Notes (Signed)
HPI Patient is seen for cardiology followup.  He has known coronary disease.  He underwent CABG in October, 2009.  He had a nuclear scan in January, 2011 with no ischemia. He has been active walking.  He has gained significant weight.  He does have some intermittent vague chest discomfort that has not related to exertion.  He is not having any significant shortness of breath..  As part of today's evaluation I have reviewed the patient's old records completely and completely updated the new electronic medical record.  Allergies  Allergen Reactions  . Iohexol      Desc: itching and hives   . Sulfadiazine     Current Outpatient Prescriptions  Medication Sig Dispense Refill  . aspirin 325 MG tablet Take 325 mg by mouth daily.        Marland Kitchen atovaquone-proguanil (MALARONE) 250-100 MG TABS Take 1 tablet by mouth as directed. One daily, start 1 day prior to departure, continue daily while in the country and 7 days after arrival       . budesonide-formoterol (SYMBICORT) 160-4.5 MCG/ACT inhaler Inhale 2 puffs into the lungs 2 (two) times daily.  1 Inhaler  6  . buPROPion (WELLBUTRIN XL) 150 MG 24 hr tablet Take 1 tablet (150 mg total) by mouth daily.  90 tablet  3  . Cholecalciferol 1000 UNITS tablet Take 1,000 Units by mouth daily.        . Diclofenac Sodium (PENNSAID) 1.5 % SOLN Place 3-5 drops onto the skin 3 (three) times daily as needed. For pain       . diphenhydrAMINE (BENADRYL) 25 MG tablet Take 25-50 mg by mouth 2 (two) times daily as needed.        . enalapril (VASOTEC) 5 MG tablet Take 2 tablets (10 mg total) by mouth daily.  180 tablet  3  . ezetimibe-simvastatin (VYTORIN) 10-80 MG per tablet Take 1 tablet by mouth at bedtime.  90 tablet  3  . glipiZIDE (GLUCOTROL XL) 5 MG 24 hr tablet Take 1 tablet (5 mg total) by mouth daily.  90 tablet  3  . glucose blood (ONE TOUCH ULTRA TEST) test strip Use as instructed  100 each  3  . LEVITRA 20 MG tablet TAKE 1/2 TO 1 TABLET DAILY AS NEEDED  10 tablet   0  . lubiprostone (AMITIZA) 24 MCG capsule Take 24-48 mcg by mouth daily as needed. For constipation       . meclizine (ANTIVERT) 12.5 MG tablet Take 1 tablet (12.5 mg total) by mouth 3 (three) times daily as needed for dizziness or nausea.  30 tablet  1  . metFORMIN (GLUCOPHAGE) 1000 MG tablet Take 1 tablet (1,000 mg total) by mouth 2 (two) times daily with a meal. For diabetes  180 tablet  3  . naproxen (NAPROSYN) 500 MG tablet Take 500 mg by mouth 2 (two) times daily as needed.        . niacin (NIASPAN) 500 MG CR tablet Take 1 tablet (500 mg total) by mouth at bedtime.  90 tablet  3  . nitroGLYCERIN (NITROLINGUAL) 0.4 MG/SPRAY spray Place 1 spray under the tongue as directed.        . OMEGA 3-6-9 FATTY ACIDS PO daily.        . ONE TOUCH LANCETS MISC 1 each by Does not apply route 2 (two) times daily.  200 each  3  . promethazine (PHENERGAN) 25 MG tablet TAKE 1 TO 2 TABLETS 4 TIMES A DAY AS NEEDED  FOR NAUSEA  60 tablet  1  . Testosterone (ANDROGEL PUMP) 1.25 GM/ACT (1%) GEL Place 10 g onto the skin every morning.        . valACYclovir (VALTREX) 500 MG tablet Take 1 tablet (500 mg total) by mouth 3 (three) times daily.  21 tablet  2  . vardenafil (LEVITRA) 20 MG tablet Take 10-20 mg by mouth daily as needed.        . ciprofloxacin (CIPRO) 500 MG tablet Take 500 mg by mouth 2 (two) times daily.          History   Social History  . Marital Status: Married    Spouse Name: N/A    Number of Children: N/A  . Years of Education: N/A   Occupational History  . Not on file.   Social History Main Topics  . Smoking status: Never Smoker   . Smokeless tobacco: Not on file  . Alcohol Use: No  . Drug Use: No  . Sexually Active: Yes   Other Topics Concern  . Not on file   Social History Narrative   Travels a lotRegular exercise - NO    Family History  Problem Relation Age of Onset  . Hypertension    . Diabetes Mother   . Mental illness Mother 45    Alzheimer  . Diabetes Father   .  Heart disease Father     CAD    Past Medical History  Diagnosis Date  . Diabetes mellitus   . Hyperlipidemia   . Snoring     prior history of surgery for sleep apnea  . Microalbuminuria   . Herpes simplex   . CAD (coronary artery disease)     Nuclear, Jul 04, 2009 NO ischemia  . Hx of CABG 04/2008    October, 2009  . Aortic root dilatation     slight, echo 2009  . Low testosterone 2010    LOW DHEA    Past Surgical History  Procedure Date  . Appendectomy 1978  . Tonsilectomy, adenoidectomy, bilateral myringotomy and tubes   . Removal of uvula     ENT  . Cholecystectomy 2009  . Coronary artery bypass graft 2009    ROS Patient denies fever, chills, headache, sweats, rash, change in vision, change in hearing, cough, nausea vomiting, urinary symptoms.  All other systems are reviewed and are negative.  PHYSICAL EXAM  Filed Vitals:   05/17/11 1517  BP: 118/72  Pulse: 80  Height: 6\' 1"  (1.854 m)  Weight: 237 lb (107.502 kg)    EKG EKG is done today and reviewed by me.  There is sinus rhythm. There is poor anterior R wave progression.  There are nonspecific ST-T wave changes.  EKG is unchanged from the prior tracing.  ASSESSMENT & PLAN

## 2011-05-17 NOTE — Assessment & Plan Note (Signed)
Blood pressure is controlled. No change in therapy. 

## 2011-05-17 NOTE — Patient Instructions (Signed)
Your physician recommends that you schedule a follow-up appointment in: 12 months with Dr Myrtis Ser Your physician encouraged you to lose weight for better health.

## 2011-05-18 ENCOUNTER — Telehealth: Payer: Self-pay | Admitting: Cardiology

## 2011-05-18 NOTE — Telephone Encounter (Signed)
Pt came in yesterday and his weight was 227 not 237, he saw it on the scale, also saw pcp last week and was 224, (in epic), would like this changed in his record

## 2011-05-18 NOTE — Progress Notes (Signed)
Pt states he thinks the incorrect weight was written down at his appt yesterday.  He states he thought the scale read 227# which correlates with the weight at his pcp appt last week (224#).  He is requesting that this weight be recorded in his chart.

## 2011-06-07 ENCOUNTER — Other Ambulatory Visit: Payer: Self-pay | Admitting: Internal Medicine

## 2011-06-08 ENCOUNTER — Other Ambulatory Visit: Payer: Self-pay | Admitting: Internal Medicine

## 2011-06-11 ENCOUNTER — Telehealth: Payer: Self-pay | Admitting: *Deleted

## 2011-06-11 NOTE — Telephone Encounter (Signed)
Requested Medications     MALARONE 250-100 MG TABS [Pharmacy Med Name: MALARONE 250-100 MG TABLET]   TAKE AS DIRECTED   Disp: 15 tablet R: 1 DAW Start: 06/08/2011  Class: Normal   Originally ordered on: 08/31/2010  Last refill: 01/17/2011

## 2011-06-11 NOTE — Telephone Encounter (Signed)
OK to fill this prescription with additional refills x0 Thank you!  

## 2011-06-12 MED ORDER — ATOVAQUONE-PROGUANIL HCL 250-100 MG PO TABS: 1.0000 | ORAL_TABLET | ORAL | Status: DC

## 2011-07-07 ENCOUNTER — Other Ambulatory Visit: Payer: Self-pay | Admitting: Internal Medicine

## 2011-08-30 ENCOUNTER — Other Ambulatory Visit: Payer: Self-pay | Admitting: Internal Medicine

## 2011-08-30 NOTE — Telephone Encounter (Signed)
Please advise on Cipro rx.

## 2011-08-31 ENCOUNTER — Other Ambulatory Visit: Payer: Self-pay | Admitting: Internal Medicine

## 2011-08-31 NOTE — Telephone Encounter (Signed)
Please advise on refill.

## 2011-09-10 ENCOUNTER — Other Ambulatory Visit (INDEPENDENT_AMBULATORY_CARE_PROVIDER_SITE_OTHER): Payer: 59

## 2011-09-10 DIAGNOSIS — I10 Essential (primary) hypertension: Secondary | ICD-10-CM

## 2011-09-10 DIAGNOSIS — E291 Testicular hypofunction: Secondary | ICD-10-CM

## 2011-09-10 DIAGNOSIS — E119 Type 2 diabetes mellitus without complications: Secondary | ICD-10-CM

## 2011-09-10 LAB — BASIC METABOLIC PANEL
Calcium: 9.2 mg/dL (ref 8.4–10.5)
Creatinine, Ser: 1.2 mg/dL (ref 0.4–1.5)
GFR: 66.08 mL/min (ref >60.00)
Sodium: 140 mEq/L (ref 135–145)

## 2011-09-10 LAB — TESTOSTERONE: Testosterone: 232.88 ng/dL — ABNORMAL LOW (ref 350.00–890.00)

## 2011-09-10 LAB — HEMOGLOBIN A1C: Hgb A1c MFr Bld: 6.7 % — ABNORMAL HIGH (ref 4.6–6.5)

## 2011-09-14 ENCOUNTER — Encounter: Payer: Self-pay | Admitting: Internal Medicine

## 2011-09-14 ENCOUNTER — Ambulatory Visit (INDEPENDENT_AMBULATORY_CARE_PROVIDER_SITE_OTHER): Payer: 59 | Admitting: Internal Medicine

## 2011-09-14 VITALS — BP 150/80 | HR 76 | Temp 100.1°F | Resp 16 | Wt 225.0 lb

## 2011-09-14 DIAGNOSIS — I152 Hypertension secondary to endocrine disorders: Secondary | ICD-10-CM | POA: Insufficient documentation

## 2011-09-14 DIAGNOSIS — E119 Type 2 diabetes mellitus without complications: Secondary | ICD-10-CM

## 2011-09-14 DIAGNOSIS — E291 Testicular hypofunction: Secondary | ICD-10-CM

## 2011-09-14 DIAGNOSIS — N529 Male erectile dysfunction, unspecified: Secondary | ICD-10-CM

## 2011-09-14 DIAGNOSIS — I10 Essential (primary) hypertension: Secondary | ICD-10-CM

## 2011-09-14 DIAGNOSIS — E1159 Type 2 diabetes mellitus with other circulatory complications: Secondary | ICD-10-CM

## 2011-09-14 DIAGNOSIS — Z0000 Encounter for general adult medical examination without abnormal findings: Secondary | ICD-10-CM

## 2011-09-14 DIAGNOSIS — I2510 Atherosclerotic heart disease of native coronary artery without angina pectoris: Secondary | ICD-10-CM

## 2011-09-14 DIAGNOSIS — E785 Hyperlipidemia, unspecified: Secondary | ICD-10-CM

## 2011-09-14 DIAGNOSIS — E1169 Type 2 diabetes mellitus with other specified complication: Secondary | ICD-10-CM

## 2011-09-14 MED ORDER — GLIPIZIDE ER 5 MG PO TB24: 5.0000 mg | ORAL_TABLET | Freq: Every day | ORAL | Status: DC

## 2011-09-14 MED ORDER — TESTOSTERONE 12.5 MG/ACT (1%) TD GEL: 10.0000 g | TRANSDERMAL | Status: DC

## 2011-09-14 MED ORDER — NIACIN ER (ANTIHYPERLIPIDEMIC) 500 MG PO TBCR: 500.0000 mg | EXTENDED_RELEASE_TABLET | Freq: Every day | ORAL | Status: DC

## 2011-09-14 MED ORDER — GLUCOSE BLOOD VI STRP: ORAL_STRIP | Status: DC

## 2011-09-14 MED ORDER — BUPROPION HCL ER (XL) 150 MG PO TB24: 150.0000 mg | ORAL_TABLET | Freq: Every day | ORAL | Status: DC

## 2011-09-14 MED ORDER — ASPIRIN 325 MG PO TABS: 325.0000 mg | ORAL_TABLET | Freq: Every day | ORAL | Status: DC

## 2011-09-14 MED ORDER — ATOVAQUONE-PROGUANIL HCL 250-100 MG PO TABS: 1.0000 | ORAL_TABLET | ORAL | Status: DC

## 2011-09-14 MED ORDER — ENALAPRIL MALEATE 5 MG PO TABS: 10.0000 mg | ORAL_TABLET | Freq: Every day | ORAL | Status: DC

## 2011-09-14 MED ORDER — METFORMIN HCL 1000 MG PO TABS: 1000.0000 mg | ORAL_TABLET | Freq: Two times a day (BID) | ORAL | Status: DC

## 2011-09-14 MED ORDER — VARDENAFIL HCL 20 MG PO TABS: 10.0000 mg | ORAL_TABLET | Freq: Every day | ORAL | Status: DC | PRN

## 2011-09-14 NOTE — Progress Notes (Signed)
Patient ID: [name] Organ Keddy Montez Hageman., male   DOB: 1950-09-20, 61 y.o.   MRN: [id number]  Subjective:    Patient ID: [name] Organ Ruggieri Montez Hageman., male    DOB: Feb 16, 1951, 61 y.o.   MRN: [id number]  HPI The patient presents for a follow-up of  chronic hypertension, chronic dyslipidemia, hypogonadism, type 2 diabetes controlled with medicines.   Wt Readings from Last 3 Encounters:  09/14/11 225 lb (102.059 kg)  05/17/11 237 lb (107.502 kg)  05/11/11 224 lb (101.606 kg)   BP Readings from Last 3 Encounters:  09/14/11 150/80  05/17/11 118/72  05/11/11 120/68       Review of Systems  Constitutional: Negative for appetite change, fatigue and unexpected weight change.  HENT: Negative for nosebleeds, congestion, sore throat, sneezing, trouble swallowing and neck pain.   Eyes: Negative for itching and visual disturbance.  Respiratory: Negative for cough.   Cardiovascular: Negative for chest pain, palpitations and leg swelling.  Gastrointestinal: Negative for nausea, diarrhea, blood in stool and abdominal distention.  Genitourinary: Negative for frequency and hematuria.  Musculoskeletal: Negative for back pain, joint swelling and gait problem.  Skin: Negative for rash.  Neurological: Negative for dizziness, tremors, speech difficulty and weakness.  Psychiatric/Behavioral: Negative for suicidal ideas, sleep disturbance, dysphoric mood and agitation. The patient is not nervous/anxious.         Objective:   Physical Exam  Vitals reviewed. Constitutional: He is oriented to person, place, and time. He appears well-developed.  HENT:  Mouth/Throat: Oropharynx is clear and moist.  Eyes: Conjunctivae are normal. Pupils are equal, round, and reactive to light.  Neck: Normal range of motion. No JVD present. No thyromegaly present.  Cardiovascular: Normal rate, regular rhythm, normal heart sounds and intact distal pulses.  Exam reveals no gallop and no friction rub.   No murmur heard. Pulmonary/Chest:  Effort normal and breath sounds normal. No respiratory distress. He has no wheezes. He has no rales. He exhibits no tenderness.  Abdominal: Soft. Bowel sounds are normal. He exhibits no distension and no mass. There is no tenderness. There is no rebound and no guarding.  Musculoskeletal: Normal range of motion. He exhibits no edema and no tenderness.  Lymphadenopathy:    He has no cervical adenopathy.  Neurological: He is alert and oriented to person, place, and time. He has normal reflexes. No cranial nerve deficit. He exhibits normal muscle tone. Coordination normal.  Skin: Skin is warm and dry. No rash noted.  Psychiatric: He has a normal mood and affect. His behavior is normal. Judgment and thought content normal.    Lab Results  Component Value Date   WBC 6.7 05/08/2010   HGB 14.9 05/08/2010   HCT 42.7 05/08/2010   PLT 157.0 05/08/2010   GLUCOSE 124* 09/10/2011   CHOL 131 12/25/2010   TRIG 159.0* 12/25/2010   HDL 35.20* 12/25/2010   LDLDIRECT 100.8 05/08/2010   LDLCALC 64 12/25/2010   ALT 19 05/04/2011   AST 19 05/04/2011   NA 140 09/10/2011   K 4.4 09/10/2011   CL 106 09/10/2011   CREATININE 1.2 09/10/2011   BUN 20 09/10/2011   CO2 28 09/10/2011   TSH 1.77 05/08/2010   PSA 0.88 05/08/2010   INR 1.3 04/08/2008   HGBA1C 6.7* 09/10/2011         Assessment & Plan:

## 2011-09-14 NOTE — Assessment & Plan Note (Signed)
Continue with current prescription therapy as reflected on the Med list.  

## 2011-09-14 NOTE — Assessment & Plan Note (Signed)
Improve diet, wt Continue with current prescription therapy as reflected on the Med list.

## 2011-09-14 NOTE — Assessment & Plan Note (Signed)
2011 Chronic  Using his DHEA

## 2011-09-21 ENCOUNTER — Telehealth: Payer: Self-pay | Admitting: *Deleted

## 2011-09-21 ENCOUNTER — Other Ambulatory Visit: Payer: Self-pay | Admitting: *Deleted

## 2011-09-21 MED ORDER — EZETIMIBE-SIMVASTATIN 10-80 MG PO TABS: 1.0000 | ORAL_TABLET | Freq: Every day | ORAL | Status: DC

## 2011-09-21 MED ORDER — VALACYCLOVIR HCL 500 MG PO TABS: 500.0000 mg | ORAL_TABLET | Freq: Three times a day (TID) | ORAL | Status: DC

## 2011-09-21 MED ORDER — ONETOUCH LANCETS MISC: 1.0000 | Freq: Two times a day (BID) | Status: DC

## 2011-09-21 MED ORDER — CIPROFLOXACIN HCL 500 MG PO TABS: 500.0000 mg | ORAL_TABLET | Freq: Two times a day (BID) | ORAL | Status: DC

## 2011-09-21 NOTE — Telephone Encounter (Signed)
Pt's wife informed rxs ready.

## 2011-09-21 NOTE — Telephone Encounter (Signed)
Pt's wife requesting written Rxs for Vytorin, lancets, Cipro and Valacyclovir. Rxs printed/pending MD sig.

## 2011-10-03 ENCOUNTER — Encounter: Payer: Self-pay | Admitting: Internal Medicine

## 2011-10-03 ENCOUNTER — Other Ambulatory Visit (INDEPENDENT_AMBULATORY_CARE_PROVIDER_SITE_OTHER): Payer: 59

## 2011-10-03 ENCOUNTER — Ambulatory Visit (INDEPENDENT_AMBULATORY_CARE_PROVIDER_SITE_OTHER): Payer: 59 | Admitting: Internal Medicine

## 2011-10-03 ENCOUNTER — Ambulatory Visit: Payer: 59 | Admitting: Internal Medicine

## 2011-10-03 VITALS — BP 98/58 | HR 90 | Temp 98.0°F | Resp 16 | Wt 225.5 lb

## 2011-10-03 DIAGNOSIS — E119 Type 2 diabetes mellitus without complications: Secondary | ICD-10-CM

## 2011-10-03 DIAGNOSIS — Z0000 Encounter for general adult medical examination without abnormal findings: Secondary | ICD-10-CM

## 2011-10-03 DIAGNOSIS — I10 Essential (primary) hypertension: Secondary | ICD-10-CM

## 2011-10-03 DIAGNOSIS — E785 Hyperlipidemia, unspecified: Secondary | ICD-10-CM

## 2011-10-03 DIAGNOSIS — E1169 Type 2 diabetes mellitus with other specified complication: Secondary | ICD-10-CM

## 2011-10-03 DIAGNOSIS — R195 Other fecal abnormalities: Secondary | ICD-10-CM

## 2011-10-03 DIAGNOSIS — E291 Testicular hypofunction: Secondary | ICD-10-CM

## 2011-10-03 DIAGNOSIS — I2510 Atherosclerotic heart disease of native coronary artery without angina pectoris: Secondary | ICD-10-CM

## 2011-10-03 DIAGNOSIS — R109 Unspecified abdominal pain: Secondary | ICD-10-CM

## 2011-10-03 DIAGNOSIS — N529 Male erectile dysfunction, unspecified: Secondary | ICD-10-CM

## 2011-10-03 DIAGNOSIS — K3189 Other diseases of stomach and duodenum: Secondary | ICD-10-CM

## 2011-10-03 DIAGNOSIS — R1013 Epigastric pain: Secondary | ICD-10-CM

## 2011-10-03 LAB — BASIC METABOLIC PANEL
Chloride: 100 mEq/L (ref 96–112)
Creatinine, Ser: 1.4 mg/dL (ref 0.4–1.5)
GFR: 54.77 mL/min — ABNORMAL LOW (ref >60.00)
Potassium: 3.7 mEq/L (ref 3.5–5.1)

## 2011-10-03 LAB — CBC WITH DIFFERENTIAL/PLATELET
Basophils Absolute: 0 10*3/uL (ref 0.0–0.1)
Eosinophils Absolute: 0.1 10*3/uL (ref 0.0–0.7)
HCT: 44.1 % (ref 39.0–52.0)
Lymphs Abs: 1.4 10*3/uL (ref 0.7–4.0)
MCHC: 34.2 g/dL (ref 30.0–36.0)
MCV: 93.7 fl (ref 78.0–100.0)
Monocytes Absolute: 0.9 10*3/uL (ref 0.1–1.0)
Neutrophils Relative %: 63.8 % (ref 43.0–77.0)
Platelets: 148 10*3/uL — ABNORMAL LOW (ref 150.0–400.0)
RDW: 12.9 % (ref 11.5–14.6)
WBC: 6.8 10*3/uL (ref 4.5–10.5)

## 2011-10-03 LAB — URINALYSIS, ROUTINE W REFLEX MICROSCOPIC
Bilirubin Urine: NEGATIVE
Hgb urine dipstick: NEGATIVE
Nitrite: NEGATIVE
Urobilinogen, UA: 0.2 (ref 0.0–1.0)

## 2011-10-03 LAB — TESTOSTERONE: Testosterone: 213.58 ng/dL — ABNORMAL LOW (ref 350.00–890.00)

## 2011-10-03 LAB — PSA: PSA: 0.8 ng/mL (ref 0.10–4.00)

## 2011-10-03 LAB — HEPATIC FUNCTION PANEL
ALT: 21 U/L (ref 0–53)
AST: 20 U/L (ref 0–37)
Alkaline Phosphatase: 32 U/L — ABNORMAL LOW (ref 39–117)
Bilirubin, Direct: 0.2 mg/dL (ref 0.0–0.3)
Total Bilirubin: 1 mg/dL (ref 0.3–1.2)

## 2011-10-03 LAB — LIPID PANEL: HDL: 27.9 mg/dL — ABNORMAL LOW (ref >39.00)

## 2011-10-03 LAB — HEMOGLOBIN A1C: Hgb A1c MFr Bld: 6.7 % — ABNORMAL HIGH (ref 4.6–6.5)

## 2011-10-03 LAB — TSH: TSH: 0.82 u[IU]/mL (ref 0.35–5.50)

## 2011-10-03 MED ORDER — ESOMEPRAZOLE MAGNESIUM 40 MG PO CPDR: 40.0000 mg | DELAYED_RELEASE_CAPSULE | Freq: Two times a day (BID) | ORAL | Status: DC

## 2011-10-03 NOTE — Assessment & Plan Note (Signed)
Patient presents with sharp epigastric pain, dark stools, dark emesis and heme positive stool on exam all suggestive of UGI bleed. He is hemodynamically stable (usual BP by his report).  Ploan - Nexium 40 mg bid             D/c ASA              CBC              Advised to call back for persistent symptoms. If Hgb down will consider admission.

## 2011-10-03 NOTE — Patient Instructions (Signed)
symptoms and stool that is positive for blood suggests an upper GI bleed: may be gastritis vs ulcer due to aspirin use or spontaneously. Plan - Nexium 40 mg twice a day           Stop ASA until further notice           Blood count today.           FOR UNRELIEVED PAIN, CONTINUED DARK/BLACK STOOLS, FURTHER EMESIS OF BLACK MATERIAL CALL - you will need an urgent gastroenterologist evaluation.    Gastrointestinal Bleeding Gastrointestinal (GI) bleeding is bleeding from the gut or any place between your mouth and anus. If bleeding is slow, you may be allowed to go home. If there is a lot of bleeding, hospitalization and observation are often required. SYMPTOMS    You vomit bright red blood or material that looks like coffee grounds.   You have blood in your stools or the stools look black and tarry.  DIAGNOSIS   Your caregiver may diagnose your condition by taking a history and a physical exam. More tests may be needed, including:  X-rays.   EGD (esophagogastroduodenoscopy), which looks at your esophagus, stomach, and small bowel through a flexible telescope-like instrument.   Colonoscopy, which looks at your colon/large bowel through a flexible telescope-like instrument.   Biopsies, which remove a small sample of tissue to examine under a microscope.  Finding out the results of your test Not all test results are available during your visit. If your test results are not back during the visit, make an appointment with your caregiver to find out the results. Do not assume everything is normal if you have not heard from your caregiver or the medical facility. It is important for you to follow up on all of your test results. HOME CARE INSTRUCTIONS    Follow instructions as suggested by your caregiver regarding medicines. Do not take aspirin, drink alcohol, or take medicines for pain and arthritis unless your caregiver says it is okay.   Get the suggested follow-up care when the tests are done.    SEEK IMMEDIATE MEDICAL CARE IF:    Your bleeding increases or you become lightheaded, weak, or pass out (faint).   You experience severe cramps in your stomach, back, or belly (abdomen).   You pass large clots.   The problems which brought you in for medical care get worse.  MAKE SURE YOU:    Understand these instructions.   Will watch your condition.   Will get help right away if you are not doing well or get worse.  Document Released: 06/15/2000 Document Revised: 06/07/2011 Document Reviewed: 05/28/2011 [facility] Patient Information 2012 [place], [place].

## 2011-10-03 NOTE — Progress Notes (Signed)
  Subjective:    Patient ID: [name] [name]., male    DOB: 01-22-1951, 61 y.o.   MRN: [id number]  HPI [name] [name] presents with a 3 day h/o sharp, burning substernal and epigastric pain. This was not exertional although he does admit to some decreased exercise tolerance over the past months. He is current with Dr. Myrtis Ser for cardiology follow up with last testing 2 years ago. He has had dark stools, but has been taking pepto-bismal. He has had emesis x 3 with dark material but not coffegrounds ( he had black beans). He has been taking ASA 325 mg daily for years. He has not taken any other medications for his symptoms.  Past Medical History  Diagnosis Date  . Diabetes mellitus   . Hyperlipidemia   . Snoring     prior history of surgery for sleep apnea  . Microalbuminuria   . Herpes simplex   . CAD (coronary artery disease)     Nuclear, Jul 04, 2009 NO ischemia  . Hx of CABG 04/2008    October, 2009  . Aortic root dilatation     slight, echo 2009  . Low testosterone 2010    LOW DHEA   Past Surgical History  Procedure Date  . Appendectomy 1978  . Tonsilectomy, adenoidectomy, bilateral myringotomy and tubes   . Removal of uvula     ENT  . Cholecystectomy 2009  . Coronary artery bypass graft 2009   Family History  Problem Relation Age of Onset  . Hypertension    . Diabetes Mother   . Mental illness Mother 52    Alzheimer  . Diabetes Father   . Heart disease Father     CAD   History   Social History  . Marital Status: Married    Spouse Name: N/A    Number of Children: N/A  . Years of Education: N/A   Occupational History  . Not on file.   Social History Main Topics  . Smoking status: Never Smoker   . Smokeless tobacco: Not on file  . Alcohol Use: No  . Drug Use: No  . Sexually Active: Yes   Other Topics Concern  . Not on file   Social History Narrative   Travels a lotRegular exercise - NO       Review of Systems System review is negative for any  constitutional, cardiac, pulmonary, GI or neuro symptoms or complaints other than as described in the HPI.     Objective:   Physical Exam Filed Vitals:   10/03/11 1144  BP: 98/58  Pulse: 90  Temp: 98 F (36.7 C)  Resp: 16   Gen'l- WNWD white man in no acute distress HEENT C&S clear Cor- RRR, 2+ peripheral pulse Abd - BS+, no guarding or rebound, no marked tenderness to deep palpation in the episgastrium Rectal - old hemorrhoidal appearance, NST, no masses in the vault. Stool strongly quaiac positive.       Assessment & Plan:

## 2011-10-04 ENCOUNTER — Telehealth: Payer: Self-pay | Admitting: Internal Medicine

## 2011-10-04 NOTE — Telephone Encounter (Signed)
[name] [name], please, inform patient that all labs are normal except for low testost thx

## 2011-10-04 NOTE — Telephone Encounter (Signed)
Pt's wife informed of lab results. 

## 2011-10-05 ENCOUNTER — Other Ambulatory Visit: Payer: Self-pay | Admitting: Internal Medicine

## 2011-11-09 ENCOUNTER — Telehealth: Payer: Self-pay | Admitting: *Deleted

## 2011-11-09 ENCOUNTER — Other Ambulatory Visit: Payer: Self-pay | Admitting: Internal Medicine

## 2011-11-09 MED ORDER — CIPROFLOXACIN HCL 500 MG PO TABS: 500.0000 mg | ORAL_TABLET | Freq: Two times a day (BID) | ORAL | Status: DC

## 2011-11-09 NOTE — Telephone Encounter (Signed)
OK to fill this prescription with additional refills x1 Thank you!  

## 2011-11-09 NOTE — Telephone Encounter (Signed)
Requested Medications     ciprofloxacin (CIPRO) 500 MG tablet [Pharmacy Med Name: CIPROFLOXACIN HCL 500 MG TAB]   The source prescription has been discontinued.   TAKE 1 TABLET TWICE A DAY   Disp: 20 tablet R: 1 Start: 11/09/2011  Class: Normal   Requested on: 06/08/2011   Originally ordered on: 01/23/2011  Last refill: 07/07/2011

## 2011-11-09 NOTE — Telephone Encounter (Signed)
Done

## 2011-12-29 ENCOUNTER — Other Ambulatory Visit: Payer: Self-pay | Admitting: Internal Medicine

## 2011-12-31 ENCOUNTER — Telehealth: Payer: Self-pay

## 2011-12-31 DIAGNOSIS — E119 Type 2 diabetes mellitus without complications: Secondary | ICD-10-CM

## 2011-12-31 DIAGNOSIS — R109 Unspecified abdominal pain: Secondary | ICD-10-CM

## 2011-12-31 NOTE — Telephone Encounter (Signed)
Pt called requesting from MD on whether labs are needed prior to CPE 07/12?

## 2012-01-01 ENCOUNTER — Telehealth: Payer: Self-pay | Admitting: Internal Medicine

## 2012-01-01 DIAGNOSIS — E119 Type 2 diabetes mellitus without complications: Secondary | ICD-10-CM

## 2012-01-01 NOTE — Telephone Encounter (Signed)
The pt's wife called and is hoping to have an A1C lab order put in for his upcoming physical.   Thanks!

## 2012-01-01 NOTE — Telephone Encounter (Signed)
Order already placed

## 2012-01-01 NOTE — Telephone Encounter (Signed)
Pt's spouse advised, lab orders placed

## 2012-01-01 NOTE — Telephone Encounter (Signed)
A1c, BMET  All others are up to date Thx

## 2012-01-02 ENCOUNTER — Ambulatory Visit: Payer: 59

## 2012-01-02 DIAGNOSIS — E119 Type 2 diabetes mellitus without complications: Secondary | ICD-10-CM

## 2012-01-02 DIAGNOSIS — R109 Unspecified abdominal pain: Secondary | ICD-10-CM

## 2012-01-02 LAB — HEMOGLOBIN A1C: Hgb A1c MFr Bld: 6.6 % — ABNORMAL HIGH (ref 4.6–6.5)

## 2012-01-02 LAB — BASIC METABOLIC PANEL
BUN: 21 mg/dL (ref 6–23)
Chloride: 106 mEq/L (ref 96–112)
Glucose, Bld: 166 mg/dL — ABNORMAL HIGH (ref 70–99)
Potassium: 4.2 mEq/L (ref 3.5–5.1)

## 2012-01-11 ENCOUNTER — Ambulatory Visit (INDEPENDENT_AMBULATORY_CARE_PROVIDER_SITE_OTHER): Payer: 59 | Admitting: Internal Medicine

## 2012-01-11 ENCOUNTER — Encounter: Payer: Self-pay | Admitting: Internal Medicine

## 2012-01-11 VITALS — BP 134/80 | HR 84 | Temp 99.3°F | Resp 16 | Ht 73.0 in | Wt 220.0 lb

## 2012-01-11 DIAGNOSIS — I10 Essential (primary) hypertension: Secondary | ICD-10-CM

## 2012-01-11 DIAGNOSIS — E1159 Type 2 diabetes mellitus with other circulatory complications: Secondary | ICD-10-CM

## 2012-01-11 DIAGNOSIS — Z951 Presence of aortocoronary bypass graft: Secondary | ICD-10-CM

## 2012-01-11 DIAGNOSIS — E663 Overweight: Secondary | ICD-10-CM

## 2012-01-11 DIAGNOSIS — E785 Hyperlipidemia, unspecified: Secondary | ICD-10-CM

## 2012-01-11 DIAGNOSIS — E291 Testicular hypofunction: Secondary | ICD-10-CM

## 2012-01-11 DIAGNOSIS — E1169 Type 2 diabetes mellitus with other specified complication: Secondary | ICD-10-CM

## 2012-01-11 DIAGNOSIS — Z0000 Encounter for general adult medical examination without abnormal findings: Secondary | ICD-10-CM

## 2012-01-11 MED ORDER — TESTOSTERONE 12.5 MG/ACT (1%) TD GEL: 10.0000 g | TRANSDERMAL | Status: DC

## 2012-01-11 MED ORDER — SILODOSIN 8 MG PO CAPS: 8.0000 mg | ORAL_CAPSULE | Freq: Every day | ORAL | Status: DC

## 2012-01-11 MED ORDER — INDOMETHACIN 50 MG PO CAPS: 50.0000 mg | ORAL_CAPSULE | Freq: Three times a day (TID) | ORAL | Status: DC | PRN

## 2012-01-11 NOTE — Assessment & Plan Note (Signed)
Continue with current prescription therapy as reflected on the Med list.  

## 2012-01-11 NOTE — Progress Notes (Signed)
Subjective:    Patient ID: [name] Organ Hipp Montez Hageman., male    DOB: 18-Oct-1950, 61 y.o.   MRN: [id number]  HPI  The patient is here for a wellness exam. The patient has been doing well overall without major physical or psychological issues going on lately, except for more fatigue and urinary frequency at night (every hr). He never took Androgel  The patient presents for a follow-up of  chronic hypertension, chronic dyslipidemia, hypogonadism, type 2 diabetes controlled with medicines.   Wt Readings from Last 3 Encounters:  01/11/12 220 lb (99.791 kg)  10/03/11 225 lb 8 oz (102.286 kg)  09/14/11 225 lb (102.059 kg)   BP Readings from Last 3 Encounters:  01/11/12 134/80  10/03/11 98/58  09/14/11 150/80       Review of Systems  Constitutional: Negative for appetite change, fatigue and unexpected weight change.  HENT: Negative for nosebleeds, congestion, sore throat, sneezing, trouble swallowing and neck pain.   Eyes: Negative for itching and visual disturbance.  Respiratory: Negative for cough.   Cardiovascular: Negative for chest pain, palpitations and leg swelling.  Gastrointestinal: Negative for nausea, diarrhea, blood in stool and abdominal distention.  Genitourinary: Negative for frequency and hematuria.  Musculoskeletal: Negative for back pain, joint swelling and gait problem.  Skin: Negative for rash.  Neurological: Negative for dizziness, tremors, speech difficulty and weakness.  Psychiatric/Behavioral: Negative for suicidal ideas, disturbed wake/sleep cycle, dysphoric mood and agitation. The patient is not nervous/anxious.         Objective:   Physical Exam  Vitals reviewed. Constitutional: He is oriented to person, place, and time. He appears well-developed.  HENT:  Mouth/Throat: Oropharynx is clear and moist.  Eyes: Conjunctivae are normal. Pupils are equal, round, and reactive to light.  Neck: Normal range of motion. No JVD present. No thyromegaly present.    Cardiovascular: Normal rate, regular rhythm, normal heart sounds and intact distal pulses.  Exam reveals no gallop and no friction rub.   No murmur heard. Pulmonary/Chest: Effort normal and breath sounds normal. No respiratory distress. He has no wheezes. He has no rales. He exhibits no tenderness.  Abdominal: Soft. Bowel sounds are normal. He exhibits no distension and no mass. There is no tenderness. There is no rebound and no guarding.  Genitourinary: Rectum normal and prostate normal. Guaiac negative stool.  Musculoskeletal: Normal range of motion. He exhibits no edema and no tenderness.  Lymphadenopathy:    He has no cervical adenopathy.  Neurological: He is alert and oriented to person, place, and time. He has normal reflexes. No cranial nerve deficit. He exhibits normal muscle tone. Coordination normal.  Skin: Skin is warm and dry. No rash noted.  Psychiatric: He has a normal mood and affect. His behavior is normal. Judgment and thought content normal.    Lab Results  Component Value Date   WBC 6.8 10/03/2011   HGB 15.1 10/03/2011   HCT 44.1 10/03/2011   PLT 148.0* 10/03/2011   GLUCOSE 166* 01/02/2012   CHOL 93 10/03/2011   TRIG 118.0 10/03/2011   HDL 27.90* 10/03/2011   LDLDIRECT 100.8 05/08/2010   LDLCALC 42 10/03/2011   ALT 21 10/03/2011   AST 20 10/03/2011   NA 142 01/02/2012   K 4.2 01/02/2012   CL 106 01/02/2012   CREATININE 1.2 01/02/2012   BUN 21 01/02/2012   CO2 27 01/02/2012   TSH 0.82 10/03/2011   PSA 0.80 10/03/2011   INR 1.3 04/08/2008   HGBA1C 6.6* 01/02/2012  Assessment & Plan:

## 2012-01-11 NOTE — Assessment & Plan Note (Signed)
Wt Readings from Last 3 Encounters:  01/11/12 220 lb (99.791 kg)  10/03/11 225 lb 8 oz (102.286 kg)  09/14/11 225 lb (102.059 kg)

## 2012-01-11 NOTE — Assessment & Plan Note (Addendum)
We discussed age appropriate health related issues, including available/recomended screening tests and vaccinations. We discussed a need for adhering to healthy diet and exercise. Labs/EKG were reviewed/ordered. All questions were answered. Zostavax rec He did have an eye exam

## 2012-02-08 ENCOUNTER — Other Ambulatory Visit: Payer: Self-pay | Admitting: Internal Medicine

## 2012-02-11 ENCOUNTER — Telehealth: Payer: Self-pay | Admitting: *Deleted

## 2012-02-11 MED ORDER — TESTOSTERONE 12.5 MG/ACT (1%) TD GEL: 40.5000 mg | TRANSDERMAL | Status: DC

## 2012-02-11 NOTE — Telephone Encounter (Signed)
R'cd fax from [facility] for clarification of Androgel signature. Rx states for pt to apply 10g onto skin q AM but quantity is only for 75g or about a 7.5 day supply. Please advise.

## 2012-02-11 NOTE — Telephone Encounter (Signed)
Please correct - see rx Thx

## 2012-02-12 NOTE — Telephone Encounter (Signed)
Rx correction called in per MD

## 2012-03-08 ENCOUNTER — Other Ambulatory Visit: Payer: Self-pay | Admitting: Internal Medicine

## 2012-03-21 ENCOUNTER — Telehealth: Payer: Self-pay

## 2012-03-21 MED ORDER — CIPROFLOXACIN HCL 500 MG PO TABS: 500.0000 mg | ORAL_TABLET | Freq: Two times a day (BID) | ORAL | Status: DC

## 2012-03-21 MED ORDER — TESTOSTERONE 5 MG/24HR TD PT24: 1.0000 | MEDICATED_PATCH | Freq: Every day | TRANSDERMAL | Status: DC

## 2012-03-21 NOTE — Telephone Encounter (Signed)
Done. Pt's  informed

## 2012-03-21 NOTE — Telephone Encounter (Signed)
Pt's spouse called stating pt will be travelling out of the country and will not be permitted to take liquid testosterone with him. Pt is requesting patches as a temporary substitute until he return. Pt is also requesting an Rx for Cipro

## 2012-03-21 NOTE — Telephone Encounter (Signed)
Ok to ref Cipro OK patches Thx

## 2012-03-24 ENCOUNTER — Telehealth: Payer: Self-pay | Admitting: *Deleted

## 2012-03-24 NOTE — Telephone Encounter (Signed)
Ok w/me. Same dose Thx

## 2012-03-24 NOTE — Telephone Encounter (Signed)
Req for androgel to be changed to packets. Please advise.

## 2012-03-25 ENCOUNTER — Other Ambulatory Visit: Payer: Self-pay | Admitting: *Deleted

## 2012-03-25 MED ORDER — ONETOUCH ULTRASOFT LANCETS MISC: Status: DC

## 2012-03-25 MED ORDER — LUBIPROSTONE 24 MCG PO CAPS: 24.0000 ug | ORAL_CAPSULE | Freq: Every day | ORAL | Status: DC | PRN

## 2012-03-25 MED ORDER — VARDENAFIL HCL 20 MG PO TABS: 10.0000 mg | ORAL_TABLET | Freq: Every day | ORAL | Status: DC | PRN

## 2012-03-25 MED ORDER — PROMETHAZINE HCL 25 MG PO TABS: 25.0000 mg | ORAL_TABLET | Freq: Four times a day (QID) | ORAL | Status: DC | PRN

## 2012-03-25 MED ORDER — INDOMETHACIN 50 MG PO CAPS: 50.0000 mg | ORAL_CAPSULE | Freq: Three times a day (TID) | ORAL | Status: AC | PRN

## 2012-03-25 MED ORDER — TESTOSTERONE 50 MG/5GM (1%) TD GEL: 5.0000 g | Freq: Every day | TRANSDERMAL | Status: DC

## 2012-03-25 MED ORDER — METFORMIN HCL 1000 MG PO TABS: 1000.0000 mg | ORAL_TABLET | Freq: Two times a day (BID) | ORAL | Status: DC

## 2012-03-25 MED ORDER — VALACYCLOVIR HCL 500 MG PO TABS: 500.0000 mg | ORAL_TABLET | Freq: Three times a day (TID) | ORAL | Status: DC

## 2012-03-25 MED ORDER — NIACIN ER (ANTIHYPERLIPIDEMIC) 500 MG PO TBCR: 500.0000 mg | EXTENDED_RELEASE_TABLET | Freq: Every day | ORAL | Status: DC

## 2012-03-25 MED ORDER — SILODOSIN 8 MG PO CAPS: 8.0000 mg | ORAL_CAPSULE | Freq: Every day | ORAL | Status: DC

## 2012-03-25 MED ORDER — GLIPIZIDE ER 5 MG PO TB24: 5.0000 mg | ORAL_TABLET | Freq: Every day | ORAL | Status: DC

## 2012-03-25 MED ORDER — BUPROPION HCL ER (XL) 150 MG PO TB24: 150.0000 mg | ORAL_TABLET | Freq: Every day | ORAL | Status: DC

## 2012-03-25 MED ORDER — GLUCOSE BLOOD VI STRP: ORAL_STRIP | Status: DC

## 2012-03-25 MED ORDER — EZETIMIBE-SIMVASTATIN 10-80 MG PO TABS: 1.0000 | ORAL_TABLET | Freq: Every day | ORAL | Status: DC

## 2012-03-25 MED ORDER — ENALAPRIL MALEATE 5 MG PO TABS: 10.0000 mg | ORAL_TABLET | Freq: Every day | ORAL | Status: DC

## 2012-03-25 MED ORDER — ESOMEPRAZOLE MAGNESIUM 40 MG PO CPDR: 40.0000 mg | DELAYED_RELEASE_CAPSULE | Freq: Two times a day (BID) | ORAL | Status: DC

## 2012-03-25 NOTE — Telephone Encounter (Signed)
Androgel packets 1% 50mg /54ml rx given to pt's wife.

## 2012-04-04 ENCOUNTER — Ambulatory Visit: Payer: 59 | Admitting: Internal Medicine

## 2012-05-02 ENCOUNTER — Other Ambulatory Visit: Payer: Self-pay | Admitting: Internal Medicine

## 2012-05-05 ENCOUNTER — Other Ambulatory Visit: Payer: Self-pay | Admitting: Cardiology

## 2012-05-05 ENCOUNTER — Other Ambulatory Visit: Payer: Self-pay | Admitting: Internal Medicine

## 2012-05-26 ENCOUNTER — Other Ambulatory Visit: Payer: Self-pay | Admitting: Internal Medicine

## 2012-06-10 ENCOUNTER — Encounter: Payer: Self-pay | Admitting: Internal Medicine

## 2012-06-10 ENCOUNTER — Other Ambulatory Visit (INDEPENDENT_AMBULATORY_CARE_PROVIDER_SITE_OTHER): Payer: 59

## 2012-06-10 ENCOUNTER — Ambulatory Visit (INDEPENDENT_AMBULATORY_CARE_PROVIDER_SITE_OTHER): Payer: 59 | Admitting: Internal Medicine

## 2012-06-10 VITALS — BP 140/60 | HR 72 | Temp 98.0°F | Resp 18 | Ht 73.0 in | Wt 229.0 lb

## 2012-06-10 DIAGNOSIS — E291 Testicular hypofunction: Secondary | ICD-10-CM

## 2012-06-10 DIAGNOSIS — I10 Essential (primary) hypertension: Secondary | ICD-10-CM

## 2012-06-10 DIAGNOSIS — Z0000 Encounter for general adult medical examination without abnormal findings: Secondary | ICD-10-CM

## 2012-06-10 DIAGNOSIS — J209 Acute bronchitis, unspecified: Secondary | ICD-10-CM

## 2012-06-10 DIAGNOSIS — E785 Hyperlipidemia, unspecified: Secondary | ICD-10-CM

## 2012-06-10 DIAGNOSIS — E1169 Type 2 diabetes mellitus with other specified complication: Secondary | ICD-10-CM

## 2012-06-10 DIAGNOSIS — E663 Overweight: Secondary | ICD-10-CM

## 2012-06-10 LAB — BASIC METABOLIC PANEL
BUN: 15 mg/dL (ref 6–23)
Chloride: 100 mEq/L (ref 96–112)
GFR: 56.99 mL/min — ABNORMAL LOW (ref >60.00)
Potassium: 4.1 mEq/L (ref 3.5–5.1)

## 2012-06-10 LAB — HEMOGLOBIN A1C: Hgb A1c MFr Bld: 7.3 % — ABNORMAL HIGH (ref 4.6–6.5)

## 2012-06-10 LAB — URIC ACID: Uric Acid, Serum: 7.8 mg/dL (ref 4.0–7.8)

## 2012-06-10 LAB — TESTOSTERONE: Testosterone: 988.45 ng/dL — ABNORMAL HIGH (ref 350.00–890.00)

## 2012-06-10 MED ORDER — PROMETHAZINE-CODEINE 6.25-10 MG/5ML PO SYRP: 5.0000 mL | ORAL_SOLUTION | ORAL | Status: DC | PRN

## 2012-06-10 MED ORDER — AZITHROMYCIN 250 MG PO TABS: ORAL_TABLET | ORAL | Status: DC

## 2012-06-10 NOTE — Patient Instructions (Addendum)
Use over-the-counter  "cold" medicines  such as  "Afrin" nasal spray for nasal congestion as directed instead. Use" Delsym" or" Robitussin" cough syrup varietis for cough.  You can use plain "Tylenol" or "Advi"l for fever, chills and achyness.   "Common cold" symptoms are usually triggered by a virus.  The antibiotics are usually not necessary. On average, a" viral cold" illness would take 4-7 days to resolve. Please, make an appointment if you are not better or if you're worse.  

## 2012-06-10 NOTE — Assessment & Plan Note (Signed)
Zpac Prom-cod syr 

## 2012-06-10 NOTE — Progress Notes (Signed)
Subjective:    Patient ID: [name] Organ Rivest Montez Hageman., male    DOB: [birth year], 61 y.o.   MRN: [id number]  Cough This is a new problem. The current episode started 1 to 4 weeks ago. The cough is productive of blood-tinged sputum. Associated symptoms include chills. Pertinent negatives include no chest pain, rash or sore throat. His past medical history is significant for bronchitis.    The patient is here for a wellness exam. The patient has been doing well overall without major physical or psychological issues going on lately, except for more fatigue and urinary frequency at night (every hr). He never took Androgel  The patient presents for a follow-up of  chronic hypertension, chronic dyslipidemia, hypogonadism, type 2 diabetes controlled with medicines.   Wt Readings from Last 3 Encounters:  06/10/12 229 lb (103.874 kg)  01/11/12 220 lb (99.791 kg)  10/03/11 225 lb 8 oz (102.286 kg)   BP Readings from Last 3 Encounters:  06/10/12 140/60  01/11/12 134/80  10/03/11 98/58       Review of Systems  Constitutional: Positive for chills. Negative for appetite change, fatigue and unexpected weight change.  HENT: Negative for nosebleeds, congestion, sore throat, sneezing, trouble swallowing and neck pain.   Eyes: Negative for itching and visual disturbance.  Respiratory: Positive for cough.   Cardiovascular: Negative for chest pain, palpitations and leg swelling.  Gastrointestinal: Negative for nausea, diarrhea, blood in stool and abdominal distention.  Genitourinary: Negative for frequency and hematuria.  Musculoskeletal: Negative for back pain, joint swelling and gait problem.  Skin: Negative for rash.  Neurological: Negative for dizziness, tremors, speech difficulty and weakness.  Psychiatric/Behavioral: Negative for suicidal ideas, sleep disturbance, dysphoric mood and agitation. The patient is not nervous/anxious.         Objective:   Physical Exam  Vitals  reviewed. Constitutional: He is oriented to person, place, and time. He appears well-developed.  HENT:  Mouth/Throat: Oropharynx is clear and moist.       eryth throat  Eyes: Conjunctivae normal are normal. Pupils are equal, round, and reactive to light.  Neck: Normal range of motion. No JVD present. No thyromegaly present.  Cardiovascular: Normal rate, regular rhythm, normal heart sounds and intact distal pulses.  Exam reveals no gallop and no friction rub.   No murmur heard. Pulmonary/Chest: Effort normal and breath sounds normal. No respiratory distress. He has no wheezes. He has no rales. He exhibits no tenderness.  Abdominal: Soft. Bowel sounds are normal. He exhibits no distension and no mass. There is no tenderness. There is no rebound and no guarding.  Genitourinary: Rectum normal and prostate normal. Guaiac negative stool.  Musculoskeletal: Normal range of motion. He exhibits no edema and no tenderness.  Lymphadenopathy:    He has no cervical adenopathy.  Neurological: He is alert and oriented to person, place, and time. He has normal reflexes. No cranial nerve deficit. He exhibits normal muscle tone. Coordination normal.  Skin: Skin is warm and dry. No rash noted.  Psychiatric: He has a normal mood and affect. His behavior is normal. Judgment and thought content normal.    Lab Results  Component Value Date   WBC 6.8 10/03/2011   HGB 15.1 10/03/2011   HCT 44.1 10/03/2011   PLT 148.0* 10/03/2011   GLUCOSE 193* 06/10/2012   CHOL 93 10/03/2011   TRIG 118.0 10/03/2011   HDL 27.90* 10/03/2011   LDLDIRECT 100.8 05/08/2010   LDLCALC 42 10/03/2011   ALT 21 10/03/2011   AST  20 10/03/2011   NA 137 06/10/2012   K 4.1 06/10/2012   CL 100 06/10/2012   CREATININE 1.4 06/10/2012   BUN 15 06/10/2012   CO2 28 06/10/2012   TSH 0.82 10/03/2011   PSA 0.80 10/03/2011   INR 1.3 04/08/2008   HGBA1C 7.3* 06/10/2012         Assessment & Plan:

## 2012-06-11 ENCOUNTER — Telehealth: Payer: Self-pay | Admitting: Internal Medicine

## 2012-06-11 NOTE — Telephone Encounter (Signed)
Pt's wife informed. Copies mailed to pt.

## 2012-06-11 NOTE — Telephone Encounter (Signed)
[name] [name], please, inform patient that all labs are normal except for elev glucose/A1c Thx

## 2012-06-12 ENCOUNTER — Telehealth: Payer: Self-pay | Admitting: Internal Medicine

## 2012-06-12 ENCOUNTER — Ambulatory Visit (INDEPENDENT_AMBULATORY_CARE_PROVIDER_SITE_OTHER)
Admission: RE | Admit: 2012-06-12 | Discharge: 2012-06-12 | Disposition: A | Discharge status: Home / Self Care | Payer: 59 | Source: Ambulatory Visit | Attending: Internal Medicine | Admitting: Internal Medicine

## 2012-06-12 ENCOUNTER — Ambulatory Visit (INDEPENDENT_AMBULATORY_CARE_PROVIDER_SITE_OTHER): Payer: 59 | Admitting: Internal Medicine

## 2012-06-12 ENCOUNTER — Encounter: Payer: Self-pay | Admitting: Internal Medicine

## 2012-06-12 VITALS — BP 162/80 | HR 76 | Temp 98.0°F | Resp 16

## 2012-06-12 DIAGNOSIS — M25539 Pain in unspecified wrist: Secondary | ICD-10-CM

## 2012-06-12 DIAGNOSIS — M79671 Pain in right foot: Secondary | ICD-10-CM | POA: Insufficient documentation

## 2012-06-12 DIAGNOSIS — M79609 Pain in unspecified limb: Secondary | ICD-10-CM

## 2012-06-12 DIAGNOSIS — M25531 Pain in right wrist: Secondary | ICD-10-CM | POA: Insufficient documentation

## 2012-06-12 MED ORDER — HYDROCODONE-ACETAMINOPHEN 5-325 MG PO TABS: 1.0000 | ORAL_TABLET | Freq: Four times a day (QID) | ORAL | Status: DC | PRN

## 2012-06-12 NOTE — Assessment & Plan Note (Signed)
X ray Wrist splint Vicodin prn

## 2012-06-12 NOTE — Telephone Encounter (Signed)
Misty Stanley, please, inform patient that th xray show no fx Thx

## 2012-06-13 NOTE — Telephone Encounter (Signed)
Pt informed

## 2012-06-16 ENCOUNTER — Encounter: Payer: Self-pay | Admitting: Cardiology

## 2012-06-16 ENCOUNTER — Ambulatory Visit (INDEPENDENT_AMBULATORY_CARE_PROVIDER_SITE_OTHER): Payer: 59 | Admitting: Cardiology

## 2012-06-16 VITALS — BP 114/30 | HR 76 | Ht 73.0 in | Wt 226.0 lb

## 2012-06-16 DIAGNOSIS — Z951 Presence of aortocoronary bypass graft: Secondary | ICD-10-CM

## 2012-06-16 DIAGNOSIS — E663 Overweight: Secondary | ICD-10-CM

## 2012-06-16 DIAGNOSIS — I2510 Atherosclerotic heart disease of native coronary artery without angina pectoris: Secondary | ICD-10-CM

## 2012-06-16 DIAGNOSIS — R5381 Other malaise: Secondary | ICD-10-CM

## 2012-06-16 DIAGNOSIS — E785 Hyperlipidemia, unspecified: Secondary | ICD-10-CM

## 2012-06-16 NOTE — Assessment & Plan Note (Signed)
Coronary disease is stable. He's not having any symptoms. His last exercise study was 2011. No further workup at this time.

## 2012-06-16 NOTE — Assessment & Plan Note (Signed)
At this time I think that his fatigue is not cardiac in origin. No further workup from the cardiology viewpoint.

## 2012-06-16 NOTE — Assessment & Plan Note (Signed)
The patient has been losing some weight. I plotted him for this.

## 2012-06-16 NOTE — Progress Notes (Signed)
HPI  The patient is seen for cardiology followup. He has known coronary disease. He underwent CABG 2009. He had a nuclear scan 2011 revealing no ischemia. His lipids are being treated. He's not having any chest pain or shortness of breath. He does have some overall fatigue. He's been traveling quite a bit with his work.  Allergies  Allergen Reactions  . Iohexol      Desc: itching and hives   . Sulfadiazine     Current Outpatient Prescriptions  Medication Sig Dispense Refill  . aspirin 325 MG tablet Take 1 tablet (325 mg total) by mouth daily.  100 tablet  3  . atovaquone-proguanil (MALARONE) 250-100 MG TABS Take 1 tablet by mouth as directed.  15 tablet  0  . azithromycin (ZITHROMAX) 250 MG tablet As directed  6 tablet  0  . budesonide-formoterol (SYMBICORT) 160-4.5 MCG/ACT inhaler Inhale 2 puffs into the lungs 2 (two) times daily.      Marland Kitchen buPROPion (WELLBUTRIN XL) 150 MG 24 hr tablet TAKE 1 TABLET (150 MG TOTAL) BY MOUTH DAILY.  90 tablet  3  . Cholecalciferol 1000 UNITS tablet Take 1,000 Units by mouth daily.        . ciprofloxacin (CIPRO) 500 MG tablet TAKE 1 TABLET (500 MG TOTAL) BY MOUTH 2 (TWO) TIMES DAILY.  20 tablet  1  . Diclofenac Sodium (PENNSAID) 1.5 % SOLN Place 3-5 drops onto the skin 3 (three) times daily as needed. For pain       . diphenhydrAMINE (BENADRYL) 25 MG tablet Take 25-50 mg by mouth 2 (two) times daily as needed.        . enalapril (VASOTEC) 5 MG tablet TAKE 2 TABLETS BY MOUTH EVERY DAY  180 tablet  3  . esomeprazole (NEXIUM) 40 MG capsule Take 1 capsule (40 mg total) by mouth 2 (two) times daily.  180 capsule  3  . glipiZIDE (GLUCOTROL XL) 5 MG 24 hr tablet TAKE 1 TABLET (5 MG TOTAL) BY MOUTH DAILY.  90 tablet  3  . glucose blood (ONE TOUCH ULTRA TEST) test strip Use as directed to check blood sugar twice daily dx 250.00  100 each  11  . HYDROcodone-acetaminophen (NORCO/VICODIN) 5-325 MG per tablet Take 1 tablet by mouth every 6 (six) hours as needed for pain.   60 tablet  1  . ibuprofen (ADVIL,MOTRIN) 600 MG tablet TAKE 1 TABLET BY MOUTH EVERY 4 HOURS AS NEEDED FOR PAIN  30 tablet  0  . indomethacin (INDOCIN) 50 MG capsule Take 1 capsule (50 mg total) by mouth 3 (three) times daily as needed (gout).  90 capsule  3  . Lancets (ONETOUCH ULTRASOFT) lancets Use as directed to check blood sugar twice daily dx 250.00  100 each  11  . Lancets (ONETOUCH ULTRASOFT) lancets CHECK BLOOD SUGAR TWICE A DAY AS DIRECTED  200 each  1  . lubiprostone (AMITIZA) 24 MCG capsule Take 1-2 capsules (24-48 mcg total) by mouth daily as needed. For constipation  180 capsule  2  . meclizine (ANTIVERT) 12.5 MG tablet Take 12.5 mg by mouth 3 (three) times daily as needed.      . metFORMIN (GLUCOPHAGE) 1000 MG tablet TAKE 1 TABLET (1,000 MG TOTAL) BY MOUTH 2 (TWO) TIMES DAILY WITH A MEAL. FOR DIABETES  180 tablet  3  . naproxen (NAPROSYN) 500 MG tablet Take 500 mg by mouth 2 (two) times daily as needed.        . niacin (NIASPAN) 500 MG  CR tablet Take 1 tablet (500 mg total) by mouth at bedtime.  90 tablet  3  . nitroGLYCERIN (NITROLINGUAL) 0.4 MG/SPRAY spray Place 1 spray under the tongue as directed.        Marland Kitchen NITROSTAT 0.4 MG SL tablet 1 TABLET UNDER TONGUE AT ONSET OF CHEST PAIN YOU MAY REPEAT EVERY 5 MINUTES FOR UP TO 3 DOSES.  50 tablet  3  . OMEGA 3-6-9 FATTY ACIDS PO daily.        . ONE TOUCH LANCETS MISC 1 each by Does not apply route 2 (two) times daily.  200 each  3  . promethazine (PHENERGAN) 25 MG tablet Take 1 tablet (25 mg total) by mouth 4 (four) times daily as needed for nausea.  60 tablet  11  . promethazine-codeine (PHENERGAN WITH CODEINE) 6.25-10 MG/5ML syrup Take 5 mLs by mouth every 4 (four) hours as needed for cough.  240 mL  0  . silodosin (RAPAFLO) 8 MG CAPS capsule Take 1 capsule (8 mg total) by mouth daily.  30 capsule  11  . testosterone (ANDRODERM) 5 MG/24HR Place 1 patch onto the skin daily.  30 patch  2  . Testosterone (ANDROGEL PUMP) 1.25 GM/ACT (1%) GEL  Place 40.5 mg onto the skin every morning. 2 pumps on skin q am  75 g  5  . testosterone (ANDROGEL) 50 MG/5GM GEL Place 5 g onto the skin daily.  30 Package  5  . valACYclovir (VALTREX) 500 MG tablet TAKE 1 TABLET (500 MG TOTAL) BY MOUTH 3 (THREE) TIMES DAILY.  21 tablet  2  . vardenafil (LEVITRA) 20 MG tablet Take 0.5-1 tablets (10-20 mg total) by mouth daily as needed.  10 tablet  11  . VYTORIN 10-80 MG per tablet TAKE 1 TABLET BY MOUTH AT BEDTIME.  90 tablet  3    History   Social History  . Marital Status: Married    Spouse Name: N/A    Number of Children: N/A  . Years of Education: N/A   Occupational History  . Not on file.   Social History Main Topics  . Smoking status: Never Smoker   . Smokeless tobacco: Not on file  . Alcohol Use: No  . Drug Use: No  . Sexually Active: Yes   Other Topics Concern  . Not on file   Social History Narrative   Travels a lotRegular exercise - NO    Family History  Problem Relation Age of Onset  . Hypertension    . Diabetes Mother   . Mental illness Mother 7    Alzheimer  . Diabetes Father   . Heart disease Father     CAD    Past Medical History  Diagnosis Date  . Diabetes mellitus   . Hyperlipidemia   . Snoring     prior history of surgery for sleep apnea  . Microalbuminuria   . Herpes simplex   . CAD (coronary artery disease)     Nuclear, Jul 04, 2009 NO ischemia  . Hx of CABG 04/2008    October, 2009  . Aortic root dilatation     slight, echo 2009  . Low testosterone 2010    LOW DHEA    Past Surgical History  Procedure Date  . Appendectomy 1978  . Tonsilectomy, adenoidectomy, bilateral myringotomy and tubes   . Removal of uvula     ENT  . Cholecystectomy 2009  . Coronary artery bypass graft 2009    Patient Active Problem List  Diagnosis  . HERPES SIMPLEX INFECTION, TYPE I  . DIABETES MELLITUS, TYPE II  . HYPOGONADISM  . HYPERLIPIDEMIA  . Overweight  . CONSTIPATION  . Acquired cyst of kidney  .  ERECTILE DYSFUNCTION  . Actinic keratosis  . FATIGUE  . ABDOMINAL PAIN  . MICROALBUMINURIA  . MYOCARDIAL PERFUSION SCAN, WITH STRESS TEST, ABNORMAL  . CORONARY ARTERY BYPASS GRAFT, HX OF  . GERD (gastroesophageal reflux disease)  . Preventative health care  . Conjunctivitis, acute  . Bronchitis, acute  . CAD (coronary artery disease)  . Hypertension associated with diabetes  . Right wrist pain  . Right foot pain    ROS   Patient denies fever, chills, headache, sweats, rash, change in vision, change in hearing, chest pain, cough, nausea vomiting, urinary symptoms. All other systems are reviewed and are negative.  PHYSICAL EXAM  Patient is oriented to person time and place. Affect is normal. There is no jugulovenous distention. Lungs are clear. Respiratory effort is nonlabored. Cardiac exam reveals S1 and S2. There no clicks or significant murmurs. The abdomen is soft. There is no peripheral edema.  Filed Vitals:   06/16/12 0917  BP: 114/30  Pulse: 76  Height: 6\' 1"  (1.854 m)  Weight: 226 lb (102.513 kg)   EKG is done today and reviewed by me. There is normal sinus rhythm. He has scattered PVCs. These have been seen in the past area there is old decreasing anterior R wave progression. There is no change from a tracing of November, 2012.  ASSESSMENT & PLAN

## 2012-06-16 NOTE — Assessment & Plan Note (Signed)
The patient is on appropriate medications and his labs have been checked within the past year. No change in therapy.

## 2012-06-23 ENCOUNTER — Encounter: Payer: Self-pay | Admitting: Internal Medicine

## 2012-06-23 ENCOUNTER — Ambulatory Visit: Payer: 59 | Admitting: Cardiology

## 2012-06-23 ENCOUNTER — Ambulatory Visit (INDEPENDENT_AMBULATORY_CARE_PROVIDER_SITE_OTHER): Payer: 59 | Admitting: Internal Medicine

## 2012-06-23 VITALS — BP 130/82 | HR 72 | Temp 98.4°F | Resp 16 | Wt 221.0 lb

## 2012-06-23 DIAGNOSIS — I2510 Atherosclerotic heart disease of native coronary artery without angina pectoris: Secondary | ICD-10-CM

## 2012-06-23 DIAGNOSIS — E119 Type 2 diabetes mellitus without complications: Secondary | ICD-10-CM

## 2012-06-23 DIAGNOSIS — E291 Testicular hypofunction: Secondary | ICD-10-CM

## 2012-06-23 DIAGNOSIS — E785 Hyperlipidemia, unspecified: Secondary | ICD-10-CM

## 2012-06-23 NOTE — Assessment & Plan Note (Signed)
Continue with current prescription therapy as reflected on the Med list.  

## 2012-06-23 NOTE — Progress Notes (Signed)
Patient ID: [name] Organ Papillion Montez Hageman., male   DOB: 10/10/1950, 61 y.o.   MRN: [id number]   Subjective:    Patient ID: [name] Organ Zaring Montez Hageman., male    DOB: 26-Apr-1951, 61 y.o.   MRN: [id number]  HPI  The patient is here for a wellness exam. The patient has been doing well overall without major physical or psychological issues going on lately, except for more fatigue and urinary frequency at night (every hr). He never took Androgel  The patient presents for a follow-up of  chronic hypertension, chronic dyslipidemia, hypogonadism, type 2 diabetes controlled with medicines.   Wt Readings from Last 3 Encounters:  06/23/12 221 lb (100.245 kg)  06/16/12 226 lb (102.513 kg)  06/10/12 229 lb (103.874 kg)   BP Readings from Last 3 Encounters:  06/23/12 130/82  06/16/12 114/30  06/12/12 162/80       Review of Systems  Constitutional: Negative for appetite change, fatigue and unexpected weight change.  HENT: Negative for nosebleeds, congestion, sneezing, trouble swallowing and neck pain.   Eyes: Negative for itching and visual disturbance.  Cardiovascular: Negative for palpitations and leg swelling.  Gastrointestinal: Negative for nausea, diarrhea, blood in stool and abdominal distention.  Genitourinary: Negative for frequency and hematuria.  Musculoskeletal: Negative for back pain, joint swelling and gait problem.  Neurological: Negative for dizziness, tremors, speech difficulty and weakness.  Psychiatric/Behavioral: Negative for suicidal ideas, sleep disturbance, dysphoric mood and agitation. The patient is not nervous/anxious.         Objective:   Physical Exam  Vitals reviewed. Constitutional: He is oriented to person, place, and time. He appears well-developed.  HENT:  Mouth/Throat: Oropharynx is clear and moist.       eryth throat  Eyes: Conjunctivae normal are normal. Pupils are equal, round, and reactive to light.  Neck: Normal range of motion. No JVD present. No thyromegaly  present.  Cardiovascular: Normal rate, regular rhythm, normal heart sounds and intact distal pulses.  Exam reveals no gallop and no friction rub.   No murmur heard. Pulmonary/Chest: Effort normal and breath sounds normal. No respiratory distress. He has no wheezes. He has no rales. He exhibits no tenderness.  Abdominal: Soft. Bowel sounds are normal. He exhibits no distension and no mass. There is no tenderness. There is no rebound and no guarding.  Genitourinary: Rectum normal and prostate normal. Guaiac negative stool.  Musculoskeletal: Normal range of motion. He exhibits no edema and no tenderness.  Lymphadenopathy:    He has no cervical adenopathy.  Neurological: He is alert and oriented to person, place, and time. He has normal reflexes. No cranial nerve deficit. He exhibits normal muscle tone. Coordination normal.  Skin: Skin is warm and dry. No rash noted.  Psychiatric: He has a normal mood and affect. His behavior is normal. Judgment and thought content normal.    Lab Results  Component Value Date   WBC 6.8 10/03/2011   HGB 15.1 10/03/2011   HCT 44.1 10/03/2011   PLT 148.0* 10/03/2011   GLUCOSE 193* 06/10/2012   CHOL 93 10/03/2011   TRIG 118.0 10/03/2011   HDL 27.90* 10/03/2011   LDLDIRECT 100.8 05/08/2010   LDLCALC 42 10/03/2011   ALT 21 10/03/2011   AST 20 10/03/2011   NA 137 06/10/2012   K 4.1 06/10/2012   CL 100 06/10/2012   CREATININE 1.4 06/10/2012   BUN 15 06/10/2012   CO2 28 06/10/2012   TSH 0.82 10/03/2011   PSA 0.80 10/03/2011   INR 1.3 04/08/2008  HGBA1C 7.3* 06/10/2012         Assessment & Plan:

## 2012-06-27 NOTE — Addendum Note (Signed)
Addended by: Williemae Area on: 06/27/2012 11:24 AM   Modules accepted: Orders

## 2012-06-29 NOTE — Progress Notes (Signed)
Subjective:    Patient ID: Ginger Organ Salois Montez Hageman., male    DOB: 1951-05-29, 61 y.o.   MRN: [id number]  Wrist Injury  The incident occurred 3 to 5 days ago. The incident occurred at home. The injury mechanism was a fall. The pain is present in the left wrist. The quality of the pain is described as aching. The pain is at a severity of 4/10. The pain is moderate. The pain has been constant since the incident. The treatment provided mild relief.    The patient is here for a wellness exam. The patient has been doing well overall without major physical or psychological issues going on lately, except for more fatigue and urinary frequency at night (every hr). He never took Androgel  The patient presents for a follow-up of  chronic hypertension, chronic dyslipidemia, hypogonadism, type 2 diabetes controlled with medicines.   Wt Readings from Last 3 Encounters:  06/23/12 221 lb (100.245 kg)  06/16/12 226 lb (102.513 kg)  06/10/12 229 lb (103.874 kg)   BP Readings from Last 3 Encounters:  06/23/12 130/82  06/16/12 114/30  06/12/12 162/80       Review of Systems  Constitutional: Negative for appetite change, fatigue and unexpected weight change.  HENT: Negative for nosebleeds, congestion, sneezing, trouble swallowing and neck pain.   Eyes: Negative for itching and visual disturbance.  Cardiovascular: Negative for palpitations and leg swelling.  Gastrointestinal: Negative for nausea, diarrhea, blood in stool and abdominal distention.  Genitourinary: Negative for frequency and hematuria.  Musculoskeletal: Negative for back pain, joint swelling and gait problem.  Neurological: Negative for dizziness, tremors, speech difficulty and weakness.  Psychiatric/Behavioral: Negative for suicidal ideas, sleep disturbance, dysphoric mood and agitation. The patient is not nervous/anxious.         Objective:   Physical Exam  Vitals reviewed. Constitutional: He is oriented to person, place, and  time. He appears well-developed.  HENT:  Mouth/Throat: Oropharynx is clear and moist.       eryth throat  Eyes: Conjunctivae normal are normal. Pupils are equal, round, and reactive to light.  Neck: Normal range of motion. No JVD present. No thyromegaly present.  Cardiovascular: Normal rate, regular rhythm, normal heart sounds and intact distal pulses.  Exam reveals no gallop and no friction rub.   No murmur heard. Pulmonary/Chest: Effort normal and breath sounds normal. No respiratory distress. He has no wheezes. He has no rales. He exhibits no tenderness.  Abdominal: Soft. Bowel sounds are normal. He exhibits no distension and no mass. There is no tenderness. There is no rebound and no guarding.  Genitourinary: Rectum normal and prostate normal. Guaiac negative stool.  Musculoskeletal: Normal range of motion. He exhibits no edema and no tenderness.       L wrist is swollen and tender  Lymphadenopathy:    He has no cervical adenopathy.  Neurological: He is alert and oriented to person, place, and time. He has normal reflexes. No cranial nerve deficit. He exhibits normal muscle tone. Coordination normal.  Skin: Skin is warm and dry. No rash noted.  Psychiatric: He has a normal mood and affect. His behavior is normal. Judgment and thought content normal.    Lab Results  Component Value Date   WBC 6.8 10/03/2011   HGB 15.1 10/03/2011   HCT 44.1 10/03/2011   PLT 148.0* 10/03/2011   GLUCOSE 193* 06/10/2012   CHOL 93 10/03/2011   TRIG 118.0 10/03/2011   HDL 27.90* 10/03/2011   LDLDIRECT 100.8 05/08/2010  LDLCALC 42 10/03/2011   ALT 21 10/03/2011   AST 20 10/03/2011   NA 137 06/10/2012   K 4.1 06/10/2012   CL 100 06/10/2012   CREATININE 1.4 06/10/2012   BUN 15 06/10/2012   CO2 28 06/10/2012   TSH 0.82 10/03/2011   PSA 0.80 10/03/2011   INR 1.3 04/08/2008   HGBA1C 7.3* 06/10/2012         Assessment & Plan:

## 2012-08-01 ENCOUNTER — Other Ambulatory Visit: Payer: Self-pay | Admitting: Internal Medicine

## 2012-08-02 ENCOUNTER — Other Ambulatory Visit: Payer: Self-pay | Admitting: Internal Medicine

## 2012-08-04 ENCOUNTER — Other Ambulatory Visit: Payer: Self-pay | Admitting: Internal Medicine

## 2012-08-04 NOTE — Telephone Encounter (Signed)
Please advise on refills- Last filled 06/11/2011

## 2012-09-16 ENCOUNTER — Other Ambulatory Visit: Payer: Self-pay | Admitting: Internal Medicine

## 2012-11-06 ENCOUNTER — Other Ambulatory Visit (INDEPENDENT_AMBULATORY_CARE_PROVIDER_SITE_OTHER): Payer: 59

## 2012-11-06 DIAGNOSIS — E291 Testicular hypofunction: Secondary | ICD-10-CM

## 2012-11-06 DIAGNOSIS — E119 Type 2 diabetes mellitus without complications: Secondary | ICD-10-CM

## 2012-11-06 DIAGNOSIS — I2510 Atherosclerotic heart disease of native coronary artery without angina pectoris: Secondary | ICD-10-CM

## 2012-11-06 LAB — BASIC METABOLIC PANEL
Chloride: 104 mEq/L (ref 96–112)
Creatinine, Ser: 1.3 mg/dL (ref 0.4–1.5)
Potassium: 4.3 mEq/L (ref 3.5–5.1)

## 2012-11-06 LAB — URIC ACID: Uric Acid, Serum: 7.2 mg/dL (ref 4.0–7.8)

## 2012-11-06 LAB — TESTOSTERONE: Testosterone: 400.5 ng/dL (ref 350.00–890.00)

## 2012-11-06 LAB — HEMOGLOBIN A1C: Hgb A1c MFr Bld: 7.3 % — ABNORMAL HIGH (ref 4.6–6.5)

## 2012-11-07 ENCOUNTER — Encounter: Payer: Self-pay | Admitting: Internal Medicine

## 2012-11-07 ENCOUNTER — Ambulatory Visit (INDEPENDENT_AMBULATORY_CARE_PROVIDER_SITE_OTHER): Payer: 59 | Admitting: Internal Medicine

## 2012-11-07 VITALS — BP 150/82 | HR 79 | Temp 97.9°F | Wt 223.0 lb

## 2012-11-07 DIAGNOSIS — E291 Testicular hypofunction: Secondary | ICD-10-CM

## 2012-11-07 DIAGNOSIS — I2510 Atherosclerotic heart disease of native coronary artery without angina pectoris: Secondary | ICD-10-CM

## 2012-11-07 DIAGNOSIS — L570 Actinic keratosis: Secondary | ICD-10-CM

## 2012-11-07 DIAGNOSIS — I10 Essential (primary) hypertension: Secondary | ICD-10-CM

## 2012-11-07 DIAGNOSIS — E119 Type 2 diabetes mellitus without complications: Secondary | ICD-10-CM

## 2012-11-07 DIAGNOSIS — E1169 Type 2 diabetes mellitus with other specified complication: Secondary | ICD-10-CM

## 2012-11-07 MED ORDER — VARDENAFIL HCL 20 MG PO TABS: 10.0000 mg | ORAL_TABLET | Freq: Every day | ORAL | Status: DC | PRN

## 2012-11-07 NOTE — Progress Notes (Signed)
Subjective:     HPI  The patient is here for a wellness exam.  The patient has been doing well overall without major physical or psychological issues going on lately, except for more fatigue and urinary frequency at night (every hr). He never took Androgel  The patient presents for a follow-up of  chronic hypertension, chronic dyslipidemia, hypogonadism, type 2 diabetes controlled with medicines.   Wt Readings from Last 3 Encounters:  11/07/12 223 lb (101.152 kg)  06/23/12 221 lb (100.245 kg)  06/16/12 226 lb (102.513 kg)   BP Readings from Last 3 Encounters:  11/07/12 150/82  06/23/12 130/82  06/16/12 114/30       Review of Systems  Constitutional: Negative for appetite change, fatigue and unexpected weight change.  HENT: Negative for nosebleeds, congestion, sneezing, trouble swallowing and neck pain.   Eyes: Negative for itching and visual disturbance.  Cardiovascular: Negative for palpitations and leg swelling.  Gastrointestinal: Negative for nausea, diarrhea, blood in stool and abdominal distention.  Genitourinary: Negative for frequency and hematuria.  Musculoskeletal: Negative for back pain, joint swelling and gait problem.  Neurological: Negative for dizziness, tremors, speech difficulty and weakness.  Psychiatric/Behavioral: Negative for suicidal ideas, sleep disturbance, dysphoric mood and agitation. The patient is not nervous/anxious.         Objective:   Physical Exam  Vitals reviewed. Constitutional: He is oriented to person, place, and time. He appears well-developed.  HENT:  Mouth/Throat: Oropharynx is clear and moist.  eryth throat  Eyes: Conjunctivae are normal. Pupils are equal, round, and reactive to light.  Neck: Normal range of motion. No JVD present. No thyromegaly present.  Cardiovascular: Normal rate, regular rhythm, normal heart sounds and intact distal pulses.  Exam reveals no gallop and no friction rub.   No murmur  heard. Pulmonary/Chest: Effort normal and breath sounds normal. No respiratory distress. He has no wheezes. He has no rales. He exhibits no tenderness.  Abdominal: Soft. Bowel sounds are normal. He exhibits no distension and no mass. There is no tenderness. There is no rebound and no guarding.  Genitourinary: Rectum normal and prostate normal. Guaiac negative stool.  Musculoskeletal: Normal range of motion. He exhibits no edema and no tenderness.  Lymphadenopathy:    He has no cervical adenopathy.  Neurological: He is alert and oriented to person, place, and time. He has normal reflexes. No cranial nerve deficit. He exhibits normal muscle tone. Coordination normal.  Skin: Skin is warm and dry. No rash noted.  Psychiatric: He has a normal mood and affect. His behavior is normal. Judgment and thought content normal.  AKs  Lab Results  Component Value Date   WBC 6.8 10/03/2011   HGB 15.1 10/03/2011   HCT 44.1 10/03/2011   PLT 148.0* 10/03/2011   GLUCOSE 155* 11/06/2012   CHOL 93 10/03/2011   TRIG 118.0 10/03/2011   HDL 27.90* 10/03/2011   LDLDIRECT 100.8 05/08/2010   LDLCALC 42 10/03/2011   ALT 21 10/03/2011   AST 20 10/03/2011   NA 141 11/06/2012   K 4.3 11/06/2012   CL 104 11/06/2012   CREATININE 1.3 11/06/2012   BUN 18 11/06/2012   CO2 27 11/06/2012   TSH 0.82 10/03/2011   PSA 0.80 10/03/2011   INR 1.3 04/08/2008   HGBA1C 7.3* 11/06/2012     Procedure Note :     Procedure : Cryosurgery   Indication:  AK(s)    Risks including unsuccessful procedure , bleeding, infection, bruising, scar, a need for a repeat  procedure and others were explained to the patient in detail as well as the benefits. Informed consent was obtained verbally.    3 lesion(s)  On face, 1 on back    was/were treated with liquid nitrogen on a Q-tip in a usual fasion . Band-Aid was applied and antibiotic ointment was given for a later use.   Tolerated well. Complications none.         Assessment & Plan:

## 2012-11-07 NOTE — Assessment & Plan Note (Signed)
Continue with current prescription therapy as reflected on the Med list.  

## 2012-11-07 NOTE — Patient Instructions (Signed)
   Postprocedure instructions :     Keep the wounds clean. You can wash them with liquid soap and water. Pat dry with gauze or a Kleenex tissue  Before applying antibiotic ointment and a Band-Aid.   You need to report immediately  if  any signs of infection develop.    

## 2012-11-09 NOTE — Assessment & Plan Note (Signed)
Continue with current prescription therapy as reflected on the Med list.  

## 2012-11-09 NOTE — Assessment & Plan Note (Signed)
Treated

## 2012-12-26 ENCOUNTER — Other Ambulatory Visit: Payer: Self-pay | Admitting: Internal Medicine

## 2012-12-30 ENCOUNTER — Other Ambulatory Visit: Payer: Self-pay | Admitting: Internal Medicine

## 2013-01-01 ENCOUNTER — Ambulatory Visit (INDEPENDENT_AMBULATORY_CARE_PROVIDER_SITE_OTHER): Payer: 59 | Admitting: Internal Medicine

## 2013-01-01 ENCOUNTER — Encounter: Payer: Self-pay | Admitting: Internal Medicine

## 2013-01-01 VITALS — BP 150/78 | HR 76 | Temp 98.6°F | Resp 16 | Wt 225.0 lb

## 2013-01-01 DIAGNOSIS — J069 Acute upper respiratory infection, unspecified: Secondary | ICD-10-CM

## 2013-01-01 MED ORDER — AZITHROMYCIN 250 MG PO TABS: ORAL_TABLET | ORAL | Status: DC

## 2013-01-01 MED ORDER — PROMETHAZINE-CODEINE 6.25-10 MG/5ML PO SYRP: 5.0000 mL | ORAL_SOLUTION | ORAL | Status: DC | PRN

## 2013-01-01 NOTE — Progress Notes (Signed)
   Subjective:     Cough This is a new problem. The current episode started in the past 7 days. The problem has been gradually worsening. The problem occurs constantly. The cough is productive of sputum. The treatment provided no relief. There is no history of asthma.      The patient presents for a follow-up of  chronic hypertension, chronic dyslipidemia, hypogonadism, type 2 diabetes controlled with medicines.   Wt Readings from Last 3 Encounters:  01/01/13 225 lb (102.059 kg)  11/07/12 223 lb (101.152 kg)  06/23/12 221 lb (100.245 kg)   BP Readings from Last 3 Encounters:  01/01/13 150/78  11/07/12 150/82  06/23/12 130/82       Review of Systems  Constitutional: Negative for appetite change, fatigue and unexpected weight change.  HENT: Negative for nosebleeds, congestion, sneezing, trouble swallowing and neck pain.   Eyes: Negative for itching and visual disturbance.  Respiratory: Positive for cough.   Cardiovascular: Negative for palpitations and leg swelling.  Gastrointestinal: Negative for nausea, diarrhea, blood in stool and abdominal distention.  Genitourinary: Negative for frequency and hematuria.  Musculoskeletal: Negative for back pain, joint swelling and gait problem.  Neurological: Negative for dizziness, tremors, speech difficulty and weakness.  Psychiatric/Behavioral: Negative for suicidal ideas, sleep disturbance, dysphoric mood and agitation. The patient is not nervous/anxious.   eryth throat      Objective:   Physical Exam  Vitals reviewed. Constitutional: He is oriented to person, place, and time. He appears well-developed.  HENT:  Mouth/Throat: Oropharynx is clear and moist.  eryth throat  Eyes: Conjunctivae are normal. Pupils are equal, round, and reactive to light.  Neck: Normal range of motion. No JVD present. No thyromegaly present.  Cardiovascular: Normal rate, regular rhythm, normal heart sounds and intact distal pulses.  Exam reveals no  gallop and no friction rub.   No murmur heard. Pulmonary/Chest: Effort normal and breath sounds normal. No respiratory distress. He has no wheezes. He has no rales. He exhibits no tenderness.  Abdominal: Soft. Bowel sounds are normal. He exhibits no distension and no mass. There is no tenderness. There is no rebound and no guarding.  Genitourinary: Rectum normal and prostate normal. Guaiac negative stool.  Musculoskeletal: Normal range of motion. He exhibits no edema and no tenderness.  Lymphadenopathy:    He has no cervical adenopathy.  Neurological: He is alert and oriented to person, place, and time. He has normal reflexes. No cranial nerve deficit. He exhibits normal muscle tone. Coordination normal.  Skin: Skin is warm and dry. No rash noted.  Psychiatric: He has a normal mood and affect. His behavior is normal. Judgment and thought content normal.  eryth throat   Lab Results  Component Value Date   WBC 6.8 10/03/2011   HGB 15.1 10/03/2011   HCT 44.1 10/03/2011   PLT 148.0* 10/03/2011   GLUCOSE 155* 11/06/2012   CHOL 93 10/03/2011   TRIG 118.0 10/03/2011   HDL 27.90* 10/03/2011   LDLDIRECT 100.8 05/08/2010   LDLCALC 42 10/03/2011   ALT 21 10/03/2011   AST 20 10/03/2011   NA 141 11/06/2012   K 4.3 11/06/2012   CL 104 11/06/2012   CREATININE 1.3 11/06/2012   BUN 18 11/06/2012   CO2 27 11/06/2012   TSH 0.82 10/03/2011   PSA 0.80 10/03/2011   INR 1.3 04/08/2008   HGBA1C 7.3* 11/06/2012             Assessment & Plan:

## 2013-01-03 DIAGNOSIS — J069 Acute upper respiratory infection, unspecified: Secondary | ICD-10-CM | POA: Insufficient documentation

## 2013-01-03 NOTE — Assessment & Plan Note (Signed)
See meds 

## 2013-01-05 ENCOUNTER — Ambulatory Visit (INDEPENDENT_AMBULATORY_CARE_PROVIDER_SITE_OTHER)
Admission: RE | Admit: 2013-01-05 | Discharge: 2013-01-05 | Disposition: A | Discharge status: Home / Self Care | Payer: 59 | Source: Ambulatory Visit | Attending: Internal Medicine | Admitting: Internal Medicine

## 2013-01-05 ENCOUNTER — Ambulatory Visit (INDEPENDENT_AMBULATORY_CARE_PROVIDER_SITE_OTHER): Payer: 59 | Admitting: Internal Medicine

## 2013-01-05 ENCOUNTER — Encounter: Payer: Self-pay | Admitting: Internal Medicine

## 2013-01-05 VITALS — BP 150/68 | HR 84 | Temp 98.4°F | Resp 16

## 2013-01-05 DIAGNOSIS — J209 Acute bronchitis, unspecified: Secondary | ICD-10-CM

## 2013-01-05 MED ORDER — AMOXICILLIN-POT CLAVULANATE 875-125 MG PO TABS: 1.0000 | ORAL_TABLET | Freq: Two times a day (BID) | ORAL | Status: DC

## 2013-01-05 NOTE — Progress Notes (Signed)
Subjective:     Cough This is a new problem. The current episode started 1 to 4 weeks ago. The problem has been unchanged. The problem occurs every few minutes. The cough is productive of sputum and productive of purulent sputum. The treatment provided no relief. There is no history of asthma.      The patient presents for a follow-up of  chronic hypertension, chronic dyslipidemia, hypogonadism, type 2 diabetes controlled with medicines.   Wt Readings from Last 3 Encounters:  01/01/13 225 lb (102.059 kg)  11/07/12 223 lb (101.152 kg)  06/23/12 221 lb (100.245 kg)   BP Readings from Last 3 Encounters:  01/05/13 150/68  01/01/13 150/78  11/07/12 150/82       Review of Systems  Constitutional: Negative for appetite change, fatigue and unexpected weight change.  HENT: Negative for nosebleeds, congestion, sneezing, trouble swallowing and neck pain.   Eyes: Negative for itching and visual disturbance.  Respiratory: Positive for cough.   Cardiovascular: Negative for palpitations and leg swelling.  Gastrointestinal: Negative for nausea, diarrhea, blood in stool and abdominal distention.  Genitourinary: Negative for frequency and hematuria.  Musculoskeletal: Negative for back pain, joint swelling and gait problem.  Neurological: Negative for dizziness, tremors, speech difficulty and weakness.  Psychiatric/Behavioral: Negative for suicidal ideas, sleep disturbance, dysphoric mood and agitation. The patient is not nervous/anxious.   eryth throat      Objective:   Physical Exam  Vitals reviewed. Constitutional: He is oriented to person, place, and time. He appears well-developed.  HENT:  Mouth/Throat: Oropharynx is clear and moist.  eryth throat  Eyes: Conjunctivae are normal. Pupils are equal, round, and reactive to light.  Neck: Normal range of motion. No JVD present. No thyromegaly present.  Cardiovascular: Normal rate, regular rhythm, normal heart sounds and intact distal  pulses.  Exam reveals no gallop and no friction rub.   No murmur heard. Pulmonary/Chest: Effort normal and breath sounds normal. No respiratory distress. He has no wheezes. He has no rales. He exhibits no tenderness.  Abdominal: Soft. Bowel sounds are normal. He exhibits no distension and no mass. There is no tenderness. There is no rebound and no guarding.  Genitourinary: Rectum normal and prostate normal. Guaiac negative stool.  Musculoskeletal: Normal range of motion. He exhibits no edema and no tenderness.  Lymphadenopathy:    He has no cervical adenopathy.  Neurological: He is alert and oriented to person, place, and time. He has normal reflexes. No cranial nerve deficit. He exhibits normal muscle tone. Coordination normal.  Skin: Skin is warm and dry. No rash noted.  Psychiatric: He has a normal mood and affect. His behavior is normal. Judgment and thought content normal.  eryth throat   Lab Results  Component Value Date   WBC 6.8 10/03/2011   HGB 15.1 10/03/2011   HCT 44.1 10/03/2011   PLT 148.0* 10/03/2011   GLUCOSE 155* 11/06/2012   CHOL 93 10/03/2011   TRIG 118.0 10/03/2011   HDL 27.90* 10/03/2011   LDLDIRECT 100.8 05/08/2010   LDLCALC 42 10/03/2011   ALT 21 10/03/2011   AST 20 10/03/2011   NA 141 11/06/2012   K 4.3 11/06/2012   CL 104 11/06/2012   CREATININE 1.3 11/06/2012   BUN 18 11/06/2012   CO2 27 11/06/2012   TSH 0.82 10/03/2011   PSA 0.80 10/03/2011   INR 1.3 04/08/2008   HGBA1C 7.3* 11/06/2012             Assessment & Plan:

## 2013-01-05 NOTE — Assessment & Plan Note (Signed)
7/14 refractory CXR Start Augmentin

## 2013-01-06 ENCOUNTER — Telehealth: Payer: Self-pay | Admitting: *Deleted

## 2013-01-06 NOTE — Telephone Encounter (Signed)
pts wife called requesting pts Chest Xray results.  Advised her of MDs result note.

## 2013-01-09 ENCOUNTER — Other Ambulatory Visit: Payer: Self-pay | Admitting: Internal Medicine

## 2013-01-09 NOTE — Telephone Encounter (Signed)
Pt's wife states it needs to be more than #10 as the pharmacist told her the pt should take it before he leaves, while he is there and after he returns home. Please advise.

## 2013-01-09 NOTE — Telephone Encounter (Signed)
Britta Mccreedy called requesting a refill on Atovaquone-proquanil 250-100mg .  States [place] is going [place] for 2 [place] and he leaves tomorrow.  She states they need the dosage instructions and enough medication to cover while he is there and upon his return home.  Please advise

## 2013-01-11 ENCOUNTER — Encounter: Payer: Self-pay | Admitting: Internal Medicine

## 2013-01-13 ENCOUNTER — Telehealth: Payer: Self-pay | Admitting: *Deleted

## 2013-01-13 NOTE — Telephone Encounter (Signed)
[facility] Triage Call Report Triage Record Num: [id number] Operator: Rebeca Allegra Patient Name: [name] [name] Call Date & Time: 01/09/2013 7:15:02PM Patient Phone: [phone] PCP: Sonda Primes Patient Gender: Male PCP Fax : [phone] Patient DOB: [birth year] [facility] Name[facility] Reason for Call: Caller: Doylene Bode; PCP: Sonda Primes (Adults only); CB#: [phone]; Call regarding ; upon call back, call was already handled-01/09/13 1836. A rx was call the the pharmacy. Protocol(s) Used: Office Note Recommended Outcome per Protocol: Information Noted and Sent to Office Reason for Outcome: Caller information to office Care Advice: ~ 07/

## 2013-01-13 NOTE — Telephone Encounter (Signed)
[facility] Triage Call Report Triage Record Num: [id number] Operator: Baldomero Lamy Patient Name: [name] [name] Call Date & Time: 01/09/2013 6:36:58PM Patient Phone: [phone] PCP: Sonda Primes Patient Gender: Male PCP Fax : [phone] Patient DOB: 1951/03/27 [facility] Name[facility] Reason for Call: Caller: Doylene Bode; PCP: Sonda Primes (Adults only); CB#: [phone]; Call regarding ; Wife calling regarding needing Malarone for Malaria. Going to the [place]. Needs Malarone. Supposed to have been called in by office today but Pharmacy has not record; [facility] [phone]. #15 Malarone were called in on 12/31/12 but wife has no knowledge of them and states she does not think husband picked up. Spoke to Dr. Patsy Lager ok to call in as directed by pharmacist. Malarone (generic ok) 250-100 mg tablets: take 1 tablet po 1-2 days before trip, 1 tab po every day of trip (15 days) and 1 tab po every day for 7 days after trip, #24 tabs. No refills. Relayed to wife. No other questions. Protocol(s) Used: Medication Questions - Adult Recommended Outcome per Protocol: Provided Health Information Reason for Outcome: Caller has medication question(s) that was answered with available resources Care Advice: Medication Storage: - Store medication as directed, for example, refrigeration. - Don't store medications in the bathroom medicine cabinet or in direct sunlight. Humidity, heat and light can affect medications' potency and safety. - Store all medications out of the reach of children. ~ Safe Use of Medications: - Keep a list of your medications, listing both brand and generic names, the prescribed dosage, common side effects, recommended action for side effects, when to call their provider, and any other specific instructions. This medication list should be updated if any prescriptions change or if new medications are added. Your list should include nonprescription  medications, vitamins and supplements. Ask about interaction with other medications, supplements or foods. - Take the medication list to each provider visit, especially if seeing more than one doctor. Make note of any known allergies on this list. - Use your medication exactly as directed. Any concerns about side effects should be discussed with your pharmacist or prescribing provider before changing doses or stopping the medication. - Don't chew or crush tablets or open capsules unless told to do so. Some long-acting medications can be absorbed too quickly when chewed or crushed. Other medications either won't be effective or could cause side effects. - If you have difficulty swallowing pills, ask your provider or the pharmacist if the medication is available in liquid form. - For liquid medications, only use measuring device that came with it or was provided by the pharmacy. Household teaspoons and tablespoons can be inaccurate. - Never take anyone else's medication. ~ 01/09/2013 7:06:54PM Page 1 of 1 [id number]

## 2013-01-30 ENCOUNTER — Other Ambulatory Visit: Payer: Self-pay | Admitting: Internal Medicine

## 2013-02-18 ENCOUNTER — Other Ambulatory Visit: Payer: Self-pay | Admitting: Internal Medicine

## 2013-02-27 ENCOUNTER — Other Ambulatory Visit (INDEPENDENT_AMBULATORY_CARE_PROVIDER_SITE_OTHER): Payer: 59

## 2013-02-27 DIAGNOSIS — E119 Type 2 diabetes mellitus without complications: Secondary | ICD-10-CM

## 2013-02-27 LAB — BASIC METABOLIC PANEL
BUN: 14 mg/dL (ref 6–23)
Chloride: 105 mEq/L (ref 96–112)
Potassium: 4.9 mEq/L (ref 3.5–5.1)

## 2013-02-27 LAB — HEMOGLOBIN A1C: Hgb A1c MFr Bld: 6.9 % — ABNORMAL HIGH (ref 4.6–6.5)

## 2013-03-06 ENCOUNTER — Ambulatory Visit: Payer: 59 | Admitting: Internal Medicine

## 2013-03-10 ENCOUNTER — Ambulatory Visit (INDEPENDENT_AMBULATORY_CARE_PROVIDER_SITE_OTHER): Payer: 59 | Admitting: Internal Medicine

## 2013-03-10 ENCOUNTER — Encounter: Payer: Self-pay | Admitting: Internal Medicine

## 2013-03-10 VITALS — BP 158/78 | HR 79 | Temp 98.5°F | Wt 226.0 lb

## 2013-03-10 DIAGNOSIS — E119 Type 2 diabetes mellitus without complications: Secondary | ICD-10-CM

## 2013-03-10 DIAGNOSIS — Z23 Encounter for immunization: Secondary | ICD-10-CM

## 2013-03-10 DIAGNOSIS — Z951 Presence of aortocoronary bypass graft: Secondary | ICD-10-CM

## 2013-03-10 DIAGNOSIS — E663 Overweight: Secondary | ICD-10-CM

## 2013-03-10 DIAGNOSIS — Z2911 Encounter for prophylactic immunotherapy for respiratory syncytial virus (RSV): Secondary | ICD-10-CM

## 2013-03-10 DIAGNOSIS — I2510 Atherosclerotic heart disease of native coronary artery without angina pectoris: Secondary | ICD-10-CM

## 2013-03-10 DIAGNOSIS — K219 Gastro-esophageal reflux disease without esophagitis: Secondary | ICD-10-CM

## 2013-03-10 DIAGNOSIS — N400 Enlarged prostate without lower urinary tract symptoms: Secondary | ICD-10-CM

## 2013-03-10 MED ORDER — CIPROFLOXACIN HCL 500 MG PO TABS: ORAL_TABLET | ORAL | Status: DC

## 2013-03-10 MED ORDER — ATOVAQUONE-PROGUANIL HCL 250-100 MG PO TABS: ORAL_TABLET | ORAL | Status: DC

## 2013-03-10 MED ORDER — BUDESONIDE-FORMOTEROL FUMARATE 160-4.5 MCG/ACT IN AERO: 2.0000 | INHALATION_SPRAY | Freq: Two times a day (BID) | RESPIRATORY_TRACT | Status: DC

## 2013-03-10 MED ORDER — VALACYCLOVIR HCL 500 MG PO TABS: ORAL_TABLET | ORAL | Status: DC

## 2013-03-10 MED ORDER — ENALAPRIL MALEATE 5 MG PO TABS: ORAL_TABLET | ORAL | Status: DC

## 2013-03-10 MED ORDER — DIPHENHYDRAMINE HCL 25 MG PO TABS: 25.0000 mg | ORAL_TABLET | Freq: Three times a day (TID) | ORAL | Status: AC | PRN

## 2013-03-10 MED ORDER — METFORMIN HCL 1000 MG PO TABS: ORAL_TABLET | ORAL | Status: DC

## 2013-03-10 MED ORDER — TADALAFIL 20 MG PO TABS: 20.0000 mg | ORAL_TABLET | Freq: Every day | ORAL | Status: DC | PRN

## 2013-03-10 MED ORDER — NITROGLYCERIN 0.4 MG SL SUBL: SUBLINGUAL_TABLET | SUBLINGUAL | Status: DC

## 2013-03-10 MED ORDER — TESTOSTERONE 50 MG/5GM (1%) TD GEL: TRANSDERMAL | Status: DC

## 2013-03-10 MED ORDER — NIACIN ER (ANTIHYPERLIPIDEMIC) 500 MG PO TBCR: 500.0000 mg | EXTENDED_RELEASE_TABLET | Freq: Every day | ORAL | Status: DC

## 2013-03-10 MED ORDER — ESOMEPRAZOLE MAGNESIUM 40 MG PO CPDR: 40.0000 mg | DELAYED_RELEASE_CAPSULE | Freq: Two times a day (BID) | ORAL | Status: DC

## 2013-03-10 MED ORDER — GLIPIZIDE ER 5 MG PO TB24: ORAL_TABLET | ORAL | Status: DC

## 2013-03-10 MED ORDER — TADALAFIL 5 MG PO TABS: 5.0000 mg | ORAL_TABLET | Freq: Every day | ORAL | Status: DC | PRN

## 2013-03-10 MED ORDER — CHOLECALCIFEROL 25 MCG (1000 UT) PO TABS: 1000.0000 [IU] | ORAL_TABLET | Freq: Every day | ORAL | Status: AC

## 2013-03-10 MED ORDER — ASPIRIN 325 MG PO TABS: 325.0000 mg | ORAL_TABLET | Freq: Every day | ORAL | Status: DC

## 2013-03-10 MED ORDER — BUPROPION HCL ER (XL) 150 MG PO TB24: ORAL_TABLET | ORAL | Status: DC

## 2013-03-10 NOTE — Assessment & Plan Note (Signed)
Continue with current prescription therapy as reflected on the Med list.  

## 2013-03-10 NOTE — Assessment & Plan Note (Signed)
Start daily Cialis 

## 2013-03-10 NOTE — Patient Instructions (Signed)
Contour pillow  

## 2013-03-10 NOTE — Assessment & Plan Note (Signed)
Better  

## 2013-03-10 NOTE — Assessment & Plan Note (Signed)
Wt Readings from Last 3 Encounters:  03/10/13 226 lb (102.513 kg)  01/01/13 225 lb (102.059 kg)  11/07/12 223 lb (101.152 kg)

## 2013-03-10 NOTE — Progress Notes (Signed)
   Subjective:     HPI    The patient has been doing well overall without major physical or psychological issues going on lately, except for more fatigue and urinary frequency at night (every hr). He never took Androgel  The patient presents for a follow-up of  chronic hypertension, chronic dyslipidemia, hypogonadism, type 2 diabetes controlled with medicines.   Wt Readings from Last 3 Encounters:  03/10/13 226 lb (102.513 kg)  01/01/13 225 lb (102.059 kg)  11/07/12 223 lb (101.152 kg)   BP Readings from Last 3 Encounters:  03/10/13 158/78  01/05/13 150/68  01/01/13 150/78       Review of Systems  Constitutional: Negative for appetite change, fatigue and unexpected weight change.  HENT: Negative for nosebleeds, congestion, sneezing, trouble swallowing and neck pain.   Eyes: Negative for itching and visual disturbance.  Cardiovascular: Negative for palpitations and leg swelling.  Gastrointestinal: Negative for nausea, diarrhea, blood in stool and abdominal distention.  Genitourinary: Negative for frequency and hematuria.  Musculoskeletal: Negative for back pain, joint swelling and gait problem.  Neurological: Negative for dizziness, tremors, speech difficulty and weakness.  Psychiatric/Behavioral: Negative for suicidal ideas, sleep disturbance, dysphoric mood and agitation. The patient is not nervous/anxious.         Objective:   Physical Exam  Vitals reviewed. Constitutional: He is oriented to person, place, and time. He appears well-developed.  HENT:  Mouth/Throat: Oropharynx is clear and moist.  eryth throat  Eyes: Conjunctivae are normal. Pupils are equal, round, and reactive to light.  Neck: Normal range of motion. No JVD present. No thyromegaly present.  Cardiovascular: Normal rate, regular rhythm, normal heart sounds and intact distal pulses.  Exam reveals no gallop and no friction rub.   No murmur heard. Pulmonary/Chest: Effort normal and breath sounds  normal. No respiratory distress. He has no wheezes. He has no rales. He exhibits no tenderness.  Abdominal: Soft. Bowel sounds are normal. He exhibits no distension and no mass. There is no tenderness. There is no rebound and no guarding.  Genitourinary: Rectum normal and prostate normal. Guaiac negative stool.  Musculoskeletal: Normal range of motion. He exhibits no edema and no tenderness.  Lymphadenopathy:    He has no cervical adenopathy.  Neurological: He is alert and oriented to person, place, and time. He has normal reflexes. No cranial nerve deficit. He exhibits normal muscle tone. Coordination normal.  Skin: Skin is warm and dry. No rash noted.  Psychiatric: He has a normal mood and affect. His behavior is normal. Judgment and thought content normal.  AKs  Lab Results  Component Value Date   WBC 6.8 10/03/2011   HGB 15.1 10/03/2011   HCT 44.1 10/03/2011   PLT 148.0* 10/03/2011   GLUCOSE 136* 02/27/2013   CHOL 93 10/03/2011   TRIG 118.0 10/03/2011   HDL 27.90* 10/03/2011   LDLDIRECT 100.8 05/08/2010   LDLCALC 42 10/03/2011   ALT 21 10/03/2011   AST 20 10/03/2011   NA 140 02/27/2013   K 4.9 02/27/2013   CL 105 02/27/2013   CREATININE 1.3 02/27/2013   BUN 14 02/27/2013   CO2 29 02/27/2013   TSH 0.82 10/03/2011   PSA 0.80 10/03/2011   INR 1.3 04/08/2008   HGBA1C 6.9* 02/27/2013              Assessment & Plan:

## 2013-03-11 ENCOUNTER — Telehealth: Payer: Self-pay | Admitting: *Deleted

## 2013-03-11 ENCOUNTER — Other Ambulatory Visit: Payer: Self-pay | Admitting: *Deleted

## 2013-03-11 MED ORDER — TADALAFIL 5 MG PO TABS: ORAL_TABLET | ORAL | Status: DC

## 2013-03-11 MED ORDER — ONETOUCH ULTRASOFT LANCETS MISC: Status: DC

## 2013-03-11 MED ORDER — GLUCOSE BLOOD VI STRP: ORAL_STRIP | Status: DC

## 2013-03-11 MED ORDER — ATOVAQUONE-PROGUANIL HCL 250-100 MG PO TABS: ORAL_TABLET | ORAL | Status: DC

## 2013-03-11 MED ORDER — EZETIMIBE-SIMVASTATIN 10-80 MG PO TABS: 1.0000 | ORAL_TABLET | Freq: Every day | ORAL | Status: DC

## 2013-03-11 NOTE — Telephone Encounter (Signed)
Pt's wife request printed Rxs for Vytorin, test strips, lancets, Cialis and malarone. Rxs printed and upfront for p/u.

## 2013-03-27 ENCOUNTER — Other Ambulatory Visit: Payer: Self-pay | Admitting: Internal Medicine

## 2013-04-22 ENCOUNTER — Other Ambulatory Visit: Payer: Self-pay | Admitting: Internal Medicine

## 2013-05-20 ENCOUNTER — Other Ambulatory Visit: Payer: Self-pay | Admitting: Internal Medicine

## 2013-06-01 ENCOUNTER — Telehealth: Payer: Self-pay | Admitting: Cardiology

## 2013-06-01 DIAGNOSIS — E785 Hyperlipidemia, unspecified: Secondary | ICD-10-CM

## 2013-06-01 NOTE — Telephone Encounter (Signed)
New Problem:  Pt is asking if he needs blood work for his appt with Dr. Myrtis Ser in January. There are no orders in Epic. Pt is requesting he have his blood drawn this week as he will be traveling out of the country next week. Pt is requesting he have blood drawn [place]. Please call pt back and let him know if he can have blood drawn

## 2013-06-01 NOTE — Telephone Encounter (Signed)
**Note De-Identified [name] Obfuscation** LMTCB. Per Dr Myrtis Ser the pt will need to have a lipid drawn this week. Lipid has been ordered.

## 2013-06-03 ENCOUNTER — Ambulatory Visit (INDEPENDENT_AMBULATORY_CARE_PROVIDER_SITE_OTHER): Payer: 59 | Admitting: Family Medicine

## 2013-06-03 ENCOUNTER — Encounter: Payer: Self-pay | Admitting: Family Medicine

## 2013-06-03 VITALS — BP 124/78 | HR 71 | Wt 224.0 lb

## 2013-06-03 DIAGNOSIS — M549 Dorsalgia, unspecified: Secondary | ICD-10-CM

## 2013-06-03 DIAGNOSIS — M545 Low back pain, unspecified: Secondary | ICD-10-CM

## 2013-06-03 MED ORDER — TRAMADOL HCL 50 MG PO TABS: 50.0000 mg | ORAL_TABLET | Freq: Every evening | ORAL | Status: DC | PRN

## 2013-06-03 MED ORDER — KETOROLAC TROMETHAMINE 60 MG/2ML IM SOLN
60.0000 mg | Freq: Once | INTRAMUSCULAR | Status: AC
  Administered 2013-06-03: 60 mg via INTRAMUSCULAR

## 2013-06-03 MED ORDER — METHYLPREDNISOLONE ACETATE 80 MG/ML IJ SUSP
80.0000 mg | Freq: Once | INTRAMUSCULAR | Status: AC
  Administered 2013-06-03: 80 mg via INTRAMUSCULAR

## 2013-06-03 MED ORDER — MELOXICAM 15 MG PO TABS: 15.0000 mg | ORAL_TABLET | Freq: Every day | ORAL | Status: DC

## 2013-06-03 NOTE — Telephone Encounter (Signed)
Follow Up ° °Pt returned call//  °

## 2013-06-03 NOTE — Telephone Encounter (Addendum)
**Note De-Identified Tarae Wooden Obfuscation** Pt states that he is scheduled to have lab work at the [place] office, ordered by Dr Posey Rea, on this Friday 12/5 in the early am and he is asking that I order Lipids to be drawn at the same time. The pt is advised that I will order a lipid to be drawn at the [facility] office on 12/5 and he is aware not to eat or drink after midnight the night before labs are drawn.

## 2013-06-03 NOTE — Progress Notes (Signed)
   CC: Acute back pain  HPI: Patient is a very pleasant 62 year old gentleman coming in with acute onset of low back pain. Patient states that this started approximately 40 hours ago. Patient does not remember any true injury. Patient has had 2 other episodes of this in the past. Patient had been told at one point he may have disc bulging from a previous CT scan. This was reviewed from 2009 that did have some mild bulging and L1-L2 and L2-L3. Patient states that this pain seems to be right-sided. Mostly seems to be localized with certain movements he can have some mild radiculopathy over the anterior medial aspect of the thigh passing the knee.  Denies any bowel or bladder incontinence, denies any fevers or chills, denies any abnormal weight loss. Patient does travel extensively for his job. Patient states that it seems to be worse when he is carrying his bag patient was the severity of 6/10. Denies any nighttime awakening.  Past medical, surgical, family and social history reviewed. Medications reviewed all in the electronic medical record.   Review of Systems: No headache, visual changes, nausea, vomiting, diarrhea, constipation, dizziness, abdominal pain, skin rash, fevers, chills, night sweats, weight loss, swollen lymph nodes, body aches, joint swelling, muscle aches, chest pain, shortness of breath, mood changes.   Objective:    Blood pressure 124/78, pulse 71, weight 224 lb (101.606 kg), SpO2 95.00%.   General: No apparent distress alert and oriented x3 mood and affect normal, dressed appropriately. Obese  HEENT: Pupils equal, extraocular movements intact Respiratory: Patient's speak in full sentences and does not appear short of breath Cardiovascular: No lower extremity edema, non tender, no erythema Skin: Warm dry intact with no signs of infection or rash on extremities or on axial skeleton. Abdomen: Soft nontender Neuro: Cranial nerves II through XII are intact, neurovascularly intact  in all extremities with 2+ DTRs and 2+ pulses. Lymph: No lymphadenopathy of posterior or anterior cervical chain or axillae bilaterally.  Gait normal with good balance and coordination.  MSK: Non tender with full range of motion and good stability and symmetric strength and tone of shoulders, elbows, wrist, hip, knee and ankles bilaterally.  Back Exam:  Inspection: Mild loss of lordosis Motion: Flexion 25 deg, Extension 25 deg, Side Bending to 35 deg bilaterally,  Rotation to 35 deg bilaterally  SLR laying: Positive right  XSLR laying: Positive right  Palpable tenderness: Paraspinal musculature over L1-L2. FABER: negative. Sensory change: Gross sensation intact to all lumbar and sacral dermatomes.  Reflexes: 2+ at both patellar tendons, 2+ at achilles tendons, Babinski's downgoing.  Strength at foot  Plantar-flexion: 5/5 Dorsi-flexion: 5/5 Eversion: 5/5 Inversion: 5/5  Leg strength  Quad: 5/5 Hamstring: 5/5 Hip flexor: 5/5 Hip abductors: 5/5  Gait unremarkable.   Impression and Recommendations:     This case required medical decision making of moderate complexity.

## 2013-06-03 NOTE — Assessment & Plan Note (Signed)
Patient does have some lumbar back pain with radiculopathy. Patient does have a history of having some disc bulging at the L1-L2 L2-L3 area. Based on patient having some anterior medial thigh discomfort from time to time I think this does correspond well to his symptoms. He should also has paraspinal tenderness in this area. Patient will start with conservative therapy. Patient was given a injection anti-inflammatories or steroid today. Patient will do meloxicam daily for 10 days as well as tramadol at night. Patient given home exercises to start in 48 hours. Patient can do this on the road as well. We discussed proper lifting technique. Patient will follow up again in 2 weeks for further evaluation. He continues to have pain or worsening radiculopathy I would like to get imaging.

## 2013-06-03 NOTE — Progress Notes (Signed)
Pre-visit discussion using our clinic review tool. No additional management support is needed unless otherwise documented below in the visit note.  

## 2013-06-03 NOTE — Patient Instructions (Signed)
Very nice to meet you Enjoy your travels Try the exercises daily starting in 48 hours.  Meloxicam daily for 10 days  Tramadol at night as needed.  Come back in 2 weeks.

## 2013-06-05 ENCOUNTER — Other Ambulatory Visit (INDEPENDENT_AMBULATORY_CARE_PROVIDER_SITE_OTHER): Payer: 59

## 2013-06-05 DIAGNOSIS — E785 Hyperlipidemia, unspecified: Secondary | ICD-10-CM

## 2013-06-05 LAB — LIPID PANEL
Cholesterol: 139 mg/dL (ref 0–200)
HDL: 32.9 mg/dL — ABNORMAL LOW (ref >39.00)
LDL Cholesterol: 72 mg/dL (ref 0–99)
Triglycerides: 171 mg/dL — ABNORMAL HIGH (ref 0.0–149.0)

## 2013-06-16 ENCOUNTER — Encounter: Payer: Self-pay | Admitting: Internal Medicine

## 2013-06-16 ENCOUNTER — Ambulatory Visit (INDEPENDENT_AMBULATORY_CARE_PROVIDER_SITE_OTHER): Payer: 59 | Admitting: Family Medicine

## 2013-06-16 ENCOUNTER — Ambulatory Visit (INDEPENDENT_AMBULATORY_CARE_PROVIDER_SITE_OTHER): Payer: 59 | Admitting: Internal Medicine

## 2013-06-16 ENCOUNTER — Ambulatory Visit (INDEPENDENT_AMBULATORY_CARE_PROVIDER_SITE_OTHER)
Admission: RE | Admit: 2013-06-16 | Discharge: 2013-06-16 | Disposition: A | Discharge status: Home / Self Care | Payer: 59 | Source: Ambulatory Visit | Attending: Family Medicine | Admitting: Family Medicine

## 2013-06-16 ENCOUNTER — Other Ambulatory Visit (INDEPENDENT_AMBULATORY_CARE_PROVIDER_SITE_OTHER): Payer: 59

## 2013-06-16 ENCOUNTER — Encounter: Payer: Self-pay | Admitting: Family Medicine

## 2013-06-16 VITALS — BP 150/90 | HR 73

## 2013-06-16 VITALS — BP 150/88 | HR 76 | Temp 98.7°F | Resp 16 | Wt 225.0 lb

## 2013-06-16 DIAGNOSIS — I152 Hypertension secondary to endocrine disorders: Secondary | ICD-10-CM

## 2013-06-16 DIAGNOSIS — E1159 Type 2 diabetes mellitus with other circulatory complications: Secondary | ICD-10-CM

## 2013-06-16 DIAGNOSIS — I10 Essential (primary) hypertension: Secondary | ICD-10-CM

## 2013-06-16 DIAGNOSIS — E119 Type 2 diabetes mellitus without complications: Secondary | ICD-10-CM

## 2013-06-16 DIAGNOSIS — M545 Low back pain, unspecified: Secondary | ICD-10-CM

## 2013-06-16 DIAGNOSIS — N529 Male erectile dysfunction, unspecified: Secondary | ICD-10-CM

## 2013-06-16 DIAGNOSIS — Z23 Encounter for immunization: Secondary | ICD-10-CM

## 2013-06-16 DIAGNOSIS — E1169 Type 2 diabetes mellitus with other specified complication: Secondary | ICD-10-CM

## 2013-06-16 LAB — BASIC METABOLIC PANEL
CO2: 26 mEq/L (ref 19–32)
Calcium: 9.5 mg/dL (ref 8.4–10.5)
Chloride: 102 mEq/L (ref 96–112)
Creatinine, Ser: 1.2 mg/dL (ref 0.4–1.5)
GFR: 67.67 mL/min (ref >60.00)
Sodium: 139 mEq/L (ref 135–145)

## 2013-06-16 LAB — HEMOGLOBIN A1C: Hgb A1c MFr Bld: 7 % — ABNORMAL HIGH (ref 4.6–6.5)

## 2013-06-16 MED ORDER — ALPROSTADIL (VASODILATOR) 10 MCG IC KIT: 10.0000 ug | PACK | INTRACAVERNOUS | Status: DC | PRN

## 2013-06-16 NOTE — Progress Notes (Signed)
Pre-visit discussion using our clinic review tool. No additional management support is needed unless otherwise documented below in the visit note.  

## 2013-06-16 NOTE — Patient Instructions (Addendum)
Good to see you No news is good news on the xrays.  Start the home exercises and do most days of the week.  Physical therapy will call  You Come back in 3 weeks.

## 2013-06-16 NOTE — Assessment & Plan Note (Signed)
Chronic  2014 refractory  Urol referral Caverject Rx given - best to start after Urol consult. Risks discussed.

## 2013-06-16 NOTE — Progress Notes (Signed)
Pre visit review using our clinic review tool, if applicable. No additional management support is needed unless otherwise documented below in the visit note. 

## 2013-06-16 NOTE — Assessment & Plan Note (Signed)
Patient back pain has improved 80% seated with patient not doing home exercises on a regular basis. Encourage patient to go to formal physical therapy and did put in order and can patient will start home exercises on a regular basis. Patient encouraged to consider weight loss and core strengthening. Patient will try these interventions and come back again in 3-4 weeks. If he continues to have pain we'll consider further imaging but we patient's recent x-rays I am optimistic he will do very well.

## 2013-06-16 NOTE — Assessment & Plan Note (Signed)
Continue with current prescription therapy as reflected on the Med list. Wt Readings from Last 3 Encounters:  06/16/13 225 lb (102.059 kg)  06/03/13 224 lb (101.606 kg)  03/10/13 226 lb (102.513 kg)

## 2013-06-16 NOTE — Assessment & Plan Note (Signed)
Continue with current prescription therapy as reflected on the Med list.  

## 2013-06-16 NOTE — Progress Notes (Signed)
   CC: Followup low back pain.  HPI: Patient is a very pleasant 62 year old gentleman coming in for followup of low back pain. Patient was seen previously and does have history of low back pain. Patient did have radiculopathy going down the right leg was concern for nerve root impingement. Patient was given home exercises but did not do them due to traveling out of the country, was given an IM dose of steroids, and anti-inflammatories. Patient states that he has improved about 80% but still has considerable amount of pain but radicular symptoms are gone.  Denies new symptoms.  Able to do ADL's and sleep comfortably.    Patient has had 2 other episodes of this in the past. Patient had been told at one point he may have disc bulging from a previous CT scan. This was reviewed from 2009 that did have some mild bulging and L1-L2 and L2-L3.  ] Past medical, surgical, family and social history reviewed. Medications reviewed all in the electronic medical record.   Review of Systems: No headache, visual changes, nausea, vomiting, diarrhea, constipation, dizziness, abdominal pain, skin rash, fevers, chills, night sweats, weight loss, swollen lymph nodes, body aches, joint swelling, muscle aches, chest pain, shortness of breath, mood changes.   Objective:    Blood pressure 150/90, pulse 73, SpO2 95.00%.   General: No apparent distress alert and oriented x3 mood and affect normal, dressed appropriately. Obese  HEENT: Pupils equal, extraocular movements intact Respiratory: Patient's speak in full sentences and does not appear short of breath Cardiovascular: No lower extremity edema, non tender, no erythema Skin: Warm dry intact with no signs of infection or rash on extremities or on axial skeleton. Abdomen: Soft nontender Neuro: Cranial nerves II through XII are intact, neurovascularly intact in all extremities with 2+ DTRs and 2+ pulses. Lymph: No lymphadenopathy of posterior or anterior cervical chain  or axillae bilaterally.  Gait normal with good balance and coordination.  MSK: Non tender with full range of motion and good stability and symmetric strength and tone of shoulders, elbows, wrist, hip, knee and ankles bilaterally.  Back Exam:  Inspection: Mild loss of lordosis Motion: Flexion 25 deg, Extension 25 deg, Side Bending to 35 deg bilaterally,  Rotation to 35 deg bilaterally  SLR laying: Positive right  XSLR laying: Positive right  Palpable tenderness: Paraspinal musculature over L1-L2. FABER: negative. Sensory change: Gross sensation intact to all lumbar and sacral dermatomes.  Reflexes: 2+ at both patellar tendons, 2+ at achilles tendons, Babinski's downgoing.  Strength at foot  Plantar-flexion: 5/5 Dorsi-flexion: 5/5 Eversion: 5/5 Inversion: 5/5  Leg strength  Quad: 5/5 Hamstring: 5/5 Hip flexor: 5/5 Hip abductors: 5/5  Gait unremarkable.  X-rays were ordered be interpreted by me today. Patient's x-rays does show very similar degenerative changes that was seen on CT scan previously. There appears to be degenerative disc disease and slight posterior slippage of L1 on L2 as well as L2 and L3 and mild maybe at L3-L4.  Impression and Recommendations:     This case required medical decision making of moderate complexity.

## 2013-06-16 NOTE — Assessment & Plan Note (Signed)
Continue with PT and current prescription therapy as reflected on the Med list.

## 2013-06-16 NOTE — Progress Notes (Signed)
   Subjective:     HPI     The patient presents for a follow-up of  chronic hypertension, chronic dyslipidemia, hypogonadism, type 2 diabetes controlled with medicines. He saw Dr Katrinka Blazing fo LBP  Wt Readings from Last 3 Encounters:  06/16/13 225 lb (102.059 kg)  06/03/13 224 lb (101.606 kg)  03/10/13 226 lb (102.513 kg)   BP Readings from Last 3 Encounters:  06/16/13 150/88  06/16/13 150/90  06/03/13 124/78       Review of Systems  Constitutional: Negative for appetite change, fatigue and unexpected weight change.  HENT: Negative for congestion, nosebleeds, sneezing and trouble swallowing.   Eyes: Negative for itching and visual disturbance.  Cardiovascular: Negative for palpitations and leg swelling.  Gastrointestinal: Negative for nausea, diarrhea, blood in stool and abdominal distention.  Genitourinary: Negative for frequency and hematuria.  Musculoskeletal: Negative for back pain, gait problem, joint swelling and neck pain.  Neurological: Negative for dizziness, tremors, speech difficulty and weakness.  Psychiatric/Behavioral: Negative for suicidal ideas, sleep disturbance, dysphoric mood and agitation. The patient is not nervous/anxious.         Objective:   Physical Exam  Vitals reviewed. Constitutional: He is oriented to person, place, and time. He appears well-developed.  HENT:  Mouth/Throat: Oropharynx is clear and moist.  eryth throat  Eyes: Conjunctivae are normal. Pupils are equal, round, and reactive to light.  Neck: Normal range of motion. No JVD present. No thyromegaly present.  Cardiovascular: Normal rate, regular rhythm, normal heart sounds and intact distal pulses.  Exam reveals no gallop and no friction rub.   No murmur heard. Pulmonary/Chest: Effort normal and breath sounds normal. No respiratory distress. He has no wheezes. He has no rales. He exhibits no tenderness.  Abdominal: Soft. Bowel sounds are normal. He exhibits no distension and no mass.  There is no tenderness. There is no rebound and no guarding.  Genitourinary: Rectum normal and prostate normal. Guaiac negative stool.  Musculoskeletal: Normal range of motion. He exhibits no edema and no tenderness.  Lymphadenopathy:    He has no cervical adenopathy.  Neurological: He is alert and oriented to person, place, and time. He has normal reflexes. No cranial nerve deficit. He exhibits normal muscle tone. Coordination normal.  Skin: Skin is warm and dry. No rash noted.  Psychiatric: He has a normal mood and affect. His behavior is normal. Judgment and thought content normal.  LS is tender w/ROM  Lab Results  Component Value Date   WBC 6.8 10/03/2011   HGB 15.1 10/03/2011   HCT 44.1 10/03/2011   PLT 148.0* 10/03/2011   GLUCOSE 136* 02/27/2013   CHOL 139 06/05/2013   TRIG 171.0* 06/05/2013   HDL 32.90* 06/05/2013   LDLDIRECT 100.8 05/08/2010   LDLCALC 72 06/05/2013   ALT 21 10/03/2011   AST 20 10/03/2011   NA 140 02/27/2013   K 4.9 02/27/2013   CL 105 02/27/2013   CREATININE 1.3 02/27/2013   BUN 14 02/27/2013   CO2 29 02/27/2013   TSH 0.82 10/03/2011   PSA 0.80 10/03/2011   INR 1.3 04/08/2008   HGBA1C 6.9* 02/27/2013              Assessment & Plan:

## 2013-06-22 ENCOUNTER — Other Ambulatory Visit: Payer: Self-pay | Admitting: Internal Medicine

## 2013-07-06 ENCOUNTER — Encounter: Payer: Self-pay | Admitting: Cardiology

## 2013-07-06 ENCOUNTER — Ambulatory Visit (INDEPENDENT_AMBULATORY_CARE_PROVIDER_SITE_OTHER): Payer: 59 | Admitting: Cardiology

## 2013-07-06 VITALS — BP 134/66 | HR 96 | Ht 73.0 in | Wt 220.0 lb

## 2013-07-06 DIAGNOSIS — E663 Overweight: Secondary | ICD-10-CM

## 2013-07-06 DIAGNOSIS — E1169 Type 2 diabetes mellitus with other specified complication: Secondary | ICD-10-CM

## 2013-07-06 DIAGNOSIS — I10 Essential (primary) hypertension: Secondary | ICD-10-CM

## 2013-07-06 DIAGNOSIS — I2510 Atherosclerotic heart disease of native coronary artery without angina pectoris: Secondary | ICD-10-CM

## 2013-07-06 DIAGNOSIS — E1159 Type 2 diabetes mellitus with other circulatory complications: Secondary | ICD-10-CM

## 2013-07-06 DIAGNOSIS — E785 Hyperlipidemia, unspecified: Secondary | ICD-10-CM

## 2013-07-06 MED ORDER — ATORVASTATIN CALCIUM 80 MG PO TABS: 80.0000 mg | ORAL_TABLET | Freq: Every day | ORAL | Status: DC

## 2013-07-06 NOTE — Assessment & Plan Note (Addendum)
Blood pressures control. No change in therapy.  As part of today's evaluation I spent greater than 25 minutes with his total care. More than half of this time was direct contact with him. We had a careful discussion about the rationale for changing to Lipitor. We also talked at length about his lipids in general.

## 2013-07-06 NOTE — Assessment & Plan Note (Signed)
The patient is not having any significant symptoms. There is no EKG change. His last nuclear study was January, 2011. He'll need a followup stress test next year. No other testing will be done at this time. Aggressive secondary prevention is a plan

## 2013-07-06 NOTE — Progress Notes (Signed)
HPI  Patient is seen today to followup coronary disease. He underwent CABG in 2009. He had a stress nuclear study in 2011. He had no ischemia at that time. He is not having any significant symptoms. He travels a lot and has difficulties with his diet. Most recent labs showed that his LDL was 72. His triglycerides were 172 and his HDL was 32.  I saw the patient last December, 2013.  Allergies  Allergen Reactions  . Iohexol      Desc: itching and hives   . Sulfadiazine     Current Outpatient Prescriptions  Medication Sig Dispense Refill  . alprostadil (EDEX) 10 MCG injection 10 mcg by Intracavitary route as needed for erectile dysfunction. use no more than 3 times per week  1 each  5  . aspirin 325 MG tablet Take 1 tablet (325 mg total) by mouth daily.  100 tablet  3  . atovaquone-proguanil (MALARONE) 250-100 MG TABS 1 TAB DAILY 1 TO 2 DAYS BEFORE TRIP, DURING TRIP THEN FOR 7 DAYS AFTER RETURN FROM TRIP  24 tablet  0  . azithromycin (ZITHROMAX) 250 MG tablet As directed  6 tablet  0  . budesonide-formoterol (SYMBICORT) 160-4.5 MCG/ACT inhaler Inhale 2 puffs into the lungs 2 (two) times daily.  1 Inhaler  11  . buPROPion (WELLBUTRIN XL) 150 MG 24 hr tablet TAKE 1 TABLET BY MOUTH EVERY DAY  90 tablet  2  . Cholecalciferol 1000 UNITS tablet Take 1 tablet (1,000 Units total) by mouth daily.  200 tablet  3  . ciprofloxacin (CIPRO) 500 MG tablet TAKE 1 TABLET (500 MG TOTAL) BY MOUTH 2 (TWO) TIMES DAILY.  20 tablet  1  . Diclofenac Sodium (PENNSAID) 1.5 % SOLN Place 3-5 drops onto the skin 3 (three) times daily as needed. For pain       . diphenhydrAMINE (BENADRYL) 25 MG tablet Take 1-2 tablets (25-50 mg total) by mouth every 8 (eight) hours as needed for allergies.  60 tablet  5  . enalapril (VASOTEC) 5 MG tablet TAKE 2 TABLETS BY MOUTH EVERY DAY  180 tablet  3  . enalapril (VASOTEC) 5 MG tablet TAKE 2 TABLETS BY MOUTH EVERY DAY  180 tablet  3  . esomeprazole (NEXIUM) 40 MG capsule Take 1  capsule (40 mg total) by mouth 2 (two) times daily.  180 capsule  3  . glipiZIDE (GLUCOTROL XL) 5 MG 24 hr tablet TAKE 1 TABLET BY MOUTH EVERY DAY  90 tablet  2  . HYDROcodone-acetaminophen (NORCO/VICODIN) 5-325 MG per tablet Take 1 tablet by mouth every 6 (six) hours as needed for pain.  60 tablet  1  . ibuprofen (ADVIL,MOTRIN) 600 MG tablet TAKE 1 TABLET BY MOUTH EVERY 4 HOURS AS NEEDED FOR PAIN  30 tablet  0  . Lancets (ONETOUCH ULTRASOFT) lancets Use as instructed. Dx:250.00  200 each  3  . metFORMIN (GLUCOPHAGE) 1000 MG tablet TAKE 1 TABLET (1,000 MG TOTAL) BY MOUTH 2 (TWO) TIMES DAILY WITH A MEAL. FOR DIABETES  180 tablet  3  . niacin (NIASPAN) 500 MG CR tablet Take 1 tablet (500 mg total) by mouth at bedtime.  90 tablet  3  . nitroGLYCERIN (NITROSTAT) 0.4 MG SL tablet 1 TABLET UNDER TONGUE AT ONSET OF CHEST PAIN YOU MAY REPEAT EVERY 5 MINUTES FOR UP TO 3 DOSES.  50 tablet  3  . OMEGA 3-6-9 FATTY ACIDS PO daily.        . ONE TOUCH ULTRA  TEST test strip USE TO CHECK BLOOD SUGAR TWICE A DAY  200 each  7  . promethazine (PHENERGAN) 25 MG tablet Take 1 tablet (25 mg total) by mouth 4 (four) times daily as needed for nausea.  60 tablet  11  . silodosin (RAPAFLO) 8 MG CAPS capsule Take 1 capsule (8 mg total) by mouth daily.  30 capsule  11  . tadalafil (CIALIS) 20 MG tablet Take 1 tablet (20 mg total) by mouth daily as needed for erectile dysfunction.  10 tablet  5  . tadalafil (CIALIS) 5 MG tablet Take one tablet daily for BPH.  30 tablet  11  . testosterone (ANDRODERM) 5 MG/24HR Place 1 patch onto the skin daily.  30 patch  2  . Testosterone (ANDROGEL PUMP) 1.25 GM/ACT (1%) GEL Place 40.5 mg onto the skin every morning. 2 pumps on skin q am  75 g  5  . testosterone (ANDROGEL) 50 MG/5GM GEL PLACE 5 GRAMS ONTO THE SKIN DAILY  150 g  5  . traMADol (ULTRAM) 50 MG tablet Take 1 tablet (50 mg total) by mouth at bedtime as needed.  30 tablet  0  . valACYclovir (VALTREX) 500 MG tablet TAKE 1 TABLET BY  MOUTH 3 TIMES A DAY  21 tablet  2  . vardenafil (LEVITRA) 20 MG tablet Take 0.5-1 tablets (10-20 mg total) by mouth daily as needed.  10 tablet  11  . VYTORIN 10-80 MG per tablet TAKE 1 TABLET BY MOUTH AT BEDTIME.  90 tablet  3   No current facility-administered medications for this visit.    History   Social History  . Marital Status: Married    Spouse Name: N/A    Number of Children: N/A  . Years of Education: N/A   Occupational History  . Not on file.   Social History Main Topics  . Smoking status: Never Smoker   . Smokeless tobacco: Not on file  . Alcohol Use: No  . Drug Use: No  . Sexual Activity: Yes   Other Topics Concern  . Not on file   Social History Narrative   Luz Lex a lot   Regular exercise - NO    Family History  Problem Relation Age of Onset  . Hypertension    . Diabetes Mother   . Mental illness Mother 84    Alzheimer  . Diabetes Father   . Heart disease Father     CAD    Past Medical History  Diagnosis Date  . Diabetes mellitus   . Hyperlipidemia   . Snoring     prior history of surgery for sleep apnea  . Microalbuminuria   . Herpes simplex   . CAD (coronary artery disease)     Nuclear, Jul 04, 2009 NO ischemia  . Hx of CABG 04/2008    October, 2009  . Aortic root dilatation     slight, echo 2009  . Low testosterone 2010    LOW DHEA    Past Surgical History  Procedure Laterality Date  . Appendectomy  1978  . Tonsilectomy, adenoidectomy, bilateral myringotomy and tubes    . Removal of uvula      ENT  . Cholecystectomy  2009  . Coronary artery bypass graft  2009    Patient Active Problem List   Diagnosis Date Noted  . Lumbar back pain 06/03/2013  . BPH (benign prostatic hyperplasia) 03/10/2013  . Acute upper respiratory infections of unspecified site 01/03/2013  . Actinic keratoses 11/07/2012  .  Right wrist pain 06/12/2012  . Right foot pain 06/12/2012  . Hypertension associated with diabetes 09/14/2011  . CAD (coronary  artery disease)   . Preventative health care 02/19/2011  . Conjunctivitis, acute 02/19/2011  . Bronchitis, acute 02/19/2011  . GERD (gastroesophageal reflux disease) 09/22/2010  . Overweight(278.02) 01/11/2010  . HYPOGONADISM 01/14/2009  . FATIGUE 01/14/2009  . CORONARY ARTERY BYPASS GRAFT, HX OF 11/07/2008  . ERECTILE DYSFUNCTION 09/24/2008  . CONSTIPATION 06/14/2008  . Acquired cyst of kidney 11/21/2007  . ABDOMINAL PAIN 11/11/2007  . DIABETES MELLITUS, TYPE II 07/09/2007  . HERPES SIMPLEX INFECTION, TYPE I 01/25/2007  . HYPERLIPIDEMIA 01/25/2007  . MICROALBUMINURIA 01/25/2007    ROS  Patient denies fever, chills, headache, sweats, rash, change in vision, change in hearing, chest pain, cough, nausea vomiting, urinary symptoms. All other systems are reviewed and are negative.  PHYSICAL EXAM  Patient is oriented to person time and place. Affect is normal. There is no jugulovenous distention. Lungs are clear. Respiratory effort is nonlabored. Cardiac exam reveals S1 and S2. There no clicks or significant murmurs. The abdomen is soft. There is no peripheral edema. There no musculoskeletal deformities. There are no skin rashes.  Filed Vitals:   07/06/13 0858  BP: 134/66  Pulse: 96  Height: 6\' 1"  (1.854 m)  Weight: 220 lb (99.791 kg)   EKG is done today and reviewed by me. There is normal sinus rhythm. There is old decreased anterior R wave progression. There is no change from the prior tracing.  ASSESSMENT & PLAN

## 2013-07-06 NOTE — Assessment & Plan Note (Signed)
The patient's LDL is 72. Most recent guidelines recommend the patient being on either Lipitor or Crestor. He has been on Vytorin with 80 mg of simvastatin. I've chosen to switch him to Lipitor 80 mg. His slight triglyceride elevation will be best treated with his ongoing treatment of his diabetes.

## 2013-07-06 NOTE — Patient Instructions (Signed)
Your physician has recommended you make the following change in your medication: stop taking Vytorin and start taking Lipitor 80 mg at bedtime  Your physician recommends that you return for lab work in: 6 weeks (07/17/13). Please do not eat or drink after midnight he night before labs are drawn.  Your physician wants you to follow-up in: 1 year. You will receive a reminder letter in the mail two months in advance. If you don't receive a letter, please call our office to schedule the follow-up appointment.

## 2013-07-06 NOTE — Assessment & Plan Note (Signed)
He is aware that trying to lose weight will help with his diabetes and his lipids.

## 2013-07-07 ENCOUNTER — Ambulatory Visit: Payer: 59 | Admitting: Family Medicine

## 2013-07-22 ENCOUNTER — Other Ambulatory Visit: Payer: Self-pay | Admitting: Internal Medicine

## 2013-08-17 ENCOUNTER — Other Ambulatory Visit (INDEPENDENT_AMBULATORY_CARE_PROVIDER_SITE_OTHER): Payer: 59

## 2013-08-17 DIAGNOSIS — E785 Hyperlipidemia, unspecified: Secondary | ICD-10-CM

## 2013-08-17 LAB — LIPID PANEL
CHOLESTEROL: 122 mg/dL (ref 0–200)
HDL: 36.4 mg/dL — ABNORMAL LOW (ref >39.00)
LDL CALC: 48 mg/dL (ref 0–99)
Total CHOL/HDL Ratio: 3
Triglycerides: 187 mg/dL — ABNORMAL HIGH (ref 0.0–149.0)
VLDL: 37.4 mg/dL (ref 0.0–40.0)

## 2013-08-18 ENCOUNTER — Other Ambulatory Visit: Payer: Self-pay | Admitting: Cardiology

## 2013-08-18 ENCOUNTER — Other Ambulatory Visit: Payer: Self-pay | Admitting: Internal Medicine

## 2013-09-23 ENCOUNTER — Other Ambulatory Visit: Payer: Self-pay | Admitting: Internal Medicine

## 2013-09-24 NOTE — Telephone Encounter (Signed)
Please advise refills 

## 2013-09-25 ENCOUNTER — Other Ambulatory Visit: Payer: 59

## 2013-09-29 ENCOUNTER — Telehealth: Payer: Self-pay | Admitting: Internal Medicine

## 2013-09-29 DIAGNOSIS — E119 Type 2 diabetes mellitus without complications: Secondary | ICD-10-CM

## 2013-09-29 DIAGNOSIS — E291 Testicular hypofunction: Secondary | ICD-10-CM

## 2013-09-29 NOTE — Telephone Encounter (Signed)
[place] BMET, A1C and testosterone Thx

## 2013-09-29 NOTE — Telephone Encounter (Signed)
Patient's wife is calling to see if Dr. Alain Marion wants him to have labs done before his appointment on Thursday. Please advise.

## 2013-09-30 NOTE — Telephone Encounter (Signed)
Lab orders have been entered and patient notified via voicemail

## 2013-10-01 ENCOUNTER — Telehealth: Payer: Self-pay | Admitting: *Deleted

## 2013-10-01 ENCOUNTER — Ambulatory Visit: Payer: 59 | Admitting: Internal Medicine

## 2013-10-01 ENCOUNTER — Encounter: Payer: Self-pay | Admitting: Internal Medicine

## 2013-10-01 ENCOUNTER — Ambulatory Visit (INDEPENDENT_AMBULATORY_CARE_PROVIDER_SITE_OTHER): Payer: 59 | Admitting: Internal Medicine

## 2013-10-01 VITALS — BP 160/78 | HR 72 | Temp 99.1°F | Resp 16 | Wt 227.0 lb

## 2013-10-01 DIAGNOSIS — N529 Male erectile dysfunction, unspecified: Secondary | ICD-10-CM

## 2013-10-01 DIAGNOSIS — E119 Type 2 diabetes mellitus without complications: Secondary | ICD-10-CM

## 2013-10-01 DIAGNOSIS — E291 Testicular hypofunction: Secondary | ICD-10-CM

## 2013-10-01 DIAGNOSIS — Z951 Presence of aortocoronary bypass graft: Secondary | ICD-10-CM

## 2013-10-01 MED ORDER — TESTOSTERONE CYPIONATE 100 MG/ML IM SOLN: 100.0000 mg | INTRAMUSCULAR | Status: DC

## 2013-10-01 MED ORDER — VARDENAFIL HCL 20 MG PO TABS: 20.0000 mg | ORAL_TABLET | Freq: Every day | ORAL | Status: DC | PRN

## 2013-10-01 NOTE — Progress Notes (Signed)
Pre visit review using our clinic review tool, if applicable. No additional management support is needed unless otherwise documented below in the visit note. 

## 2013-10-01 NOTE — Progress Notes (Signed)
   Subjective:     HPI     The patient presents for a follow-up of  chronic hypertension, chronic dyslipidemia, hypogonadism, type 2 diabetes controlled with medicines. He saw Dr Tamala Julian fo LBP Dr Jessee Avers started testost inj  Wt Readings from Last 3 Encounters:  10/01/13 227 lb (102.967 kg)  07/06/13 220 lb (99.791 kg)  06/16/13 225 lb (102.059 kg)   BP Readings from Last 3 Encounters:  10/01/13 160/78  07/06/13 134/66  06/16/13 150/88       Review of Systems  Constitutional: Negative for appetite change, fatigue and unexpected weight change.  HENT: Negative for congestion, nosebleeds, sneezing and trouble swallowing.   Eyes: Negative for itching and visual disturbance.  Cardiovascular: Negative for palpitations and leg swelling.  Gastrointestinal: Negative for nausea, diarrhea, blood in stool and abdominal distention.  Genitourinary: Negative for frequency and hematuria.  Musculoskeletal: Negative for back pain, gait problem, joint swelling and neck pain.  Neurological: Negative for dizziness, tremors, speech difficulty and weakness.  Psychiatric/Behavioral: Negative for suicidal ideas, sleep disturbance, dysphoric mood and agitation. The patient is not nervous/anxious.         Objective:   Physical Exam  Vitals reviewed. Constitutional: He is oriented to person, place, and time. He appears well-developed.  HENT:  Mouth/Throat: Oropharynx is clear and moist.  eryth throat  Eyes: Conjunctivae are normal. Pupils are equal, round, and reactive to light.  Neck: Normal range of motion. No JVD present. No thyromegaly present.  Cardiovascular: Normal rate, regular rhythm, normal heart sounds and intact distal pulses.  Exam reveals no gallop and no friction rub.   No murmur heard. Pulmonary/Chest: Effort normal and breath sounds normal. No respiratory distress. He has no wheezes. He has no rales. He exhibits no tenderness.  Abdominal: Soft. Bowel sounds are normal. He  exhibits no distension and no mass. There is no tenderness. There is no rebound and no guarding.  Genitourinary: Rectum normal and prostate normal. Guaiac negative stool.  Musculoskeletal: Normal range of motion. He exhibits no edema and no tenderness.  Lymphadenopathy:    He has no cervical adenopathy.  Neurological: He is alert and oriented to person, place, and time. He has normal reflexes. No cranial nerve deficit. He exhibits normal muscle tone. Coordination normal.  Skin: Skin is warm and dry. No rash noted.  Psychiatric: He has a normal mood and affect. His behavior is normal. Judgment and thought content normal.   Lab Results  Component Value Date   WBC 6.8 10/03/2011   HGB 15.1 10/03/2011   HCT 44.1 10/03/2011   PLT 148.0* 10/03/2011   GLUCOSE 116* 06/16/2013   CHOL 122 08/17/2013   TRIG 187.0* 08/17/2013   HDL 36.40* 08/17/2013   LDLDIRECT 100.8 05/08/2010   LDLCALC 48 08/17/2013   ALT 21 10/03/2011   AST 20 10/03/2011   NA 139 06/16/2013   K 4.1 06/16/2013   CL 102 06/16/2013   CREATININE 1.2 06/16/2013   BUN 18 06/16/2013   CO2 26 06/16/2013   TSH 0.82 10/03/2011   PSA 0.80 10/03/2011   INR 1.3 04/08/2008   HGBA1C 7.0* 06/16/2013              Assessment & Plan:

## 2013-10-01 NOTE — Telephone Encounter (Signed)
Fax received from pharmacy stating that Levitra is not covered by insurance but it does cover Viagra & Cialis.

## 2013-10-01 NOTE — Patient Instructions (Signed)
Try Benicar 40 mg a day in place of Enalapril

## 2013-10-02 NOTE — Assessment & Plan Note (Signed)
Continue with current prescription therapy as reflected on the Med list.  

## 2013-10-02 NOTE — Assessment & Plan Note (Signed)
Per Dr Vernie Shanks Penile inj did not help  He'll try Levitra or [place]

## 2013-10-02 NOTE — Assessment & Plan Note (Signed)
Continue with current IM injection

## 2013-10-05 ENCOUNTER — Other Ambulatory Visit (INDEPENDENT_AMBULATORY_CARE_PROVIDER_SITE_OTHER): Payer: 59

## 2013-10-05 DIAGNOSIS — E291 Testicular hypofunction: Secondary | ICD-10-CM

## 2013-10-05 DIAGNOSIS — E119 Type 2 diabetes mellitus without complications: Secondary | ICD-10-CM

## 2013-10-05 LAB — BASIC METABOLIC PANEL
BUN: 13 mg/dL (ref 6–23)
CHLORIDE: 104 meq/L (ref 96–112)
CO2: 30 mEq/L (ref 19–32)
CREATININE: 1.3 mg/dL (ref 0.4–1.5)
Calcium: 9.5 mg/dL (ref 8.4–10.5)
GFR: 60.89 mL/min (ref >60.00)
Glucose, Bld: 169 mg/dL — ABNORMAL HIGH (ref 70–99)
Potassium: 4.5 mEq/L (ref 3.5–5.1)
Sodium: 141 mEq/L (ref 135–145)

## 2013-10-05 LAB — TESTOSTERONE: TESTOSTERONE: 1161.67 ng/dL — AB (ref 350.00–890.00)

## 2013-10-05 LAB — HEMOGLOBIN A1C: Hgb A1c MFr Bld: 7.5 % — ABNORMAL HIGH (ref 4.6–6.5)

## 2013-10-05 NOTE — Telephone Encounter (Signed)
Ok Cialis 20 mg po q 3 d prn #12 6 ref Thx

## 2013-10-07 MED ORDER — TADALAFIL 20 MG PO TABS: 20.0000 mg | ORAL_TABLET | ORAL | Status: DC | PRN

## 2013-10-07 NOTE — Telephone Encounter (Signed)
Refill done.  

## 2013-11-18 ENCOUNTER — Other Ambulatory Visit: Payer: Self-pay | Admitting: Internal Medicine

## 2013-12-11 ENCOUNTER — Other Ambulatory Visit: Payer: Self-pay | Admitting: Internal Medicine

## 2014-01-13 ENCOUNTER — Other Ambulatory Visit: Payer: Self-pay | Admitting: Internal Medicine

## 2014-01-15 ENCOUNTER — Other Ambulatory Visit: Payer: Self-pay | Admitting: Internal Medicine

## 2014-01-22 ENCOUNTER — Telehealth: Payer: Self-pay | Admitting: Internal Medicine

## 2014-01-22 NOTE — Telephone Encounter (Signed)
Patient's spouse states the patient is requesting to have labs done on 02/01/14. He will be in town the week of 02/01/14 only.

## 2014-01-22 NOTE — Telephone Encounter (Signed)
Pt's spouse request lab work a week prior to his appt 02/05/14. Please advise

## 2014-02-01 ENCOUNTER — Other Ambulatory Visit: Payer: Self-pay | Admitting: *Deleted

## 2014-02-01 ENCOUNTER — Ambulatory Visit: Payer: 59 | Admitting: Internal Medicine

## 2014-02-01 DIAGNOSIS — R5381 Other malaise: Secondary | ICD-10-CM

## 2014-02-01 DIAGNOSIS — E119 Type 2 diabetes mellitus without complications: Secondary | ICD-10-CM

## 2014-02-01 DIAGNOSIS — E291 Testicular hypofunction: Secondary | ICD-10-CM

## 2014-02-01 DIAGNOSIS — E1159 Type 2 diabetes mellitus with other circulatory complications: Secondary | ICD-10-CM

## 2014-02-01 DIAGNOSIS — E785 Hyperlipidemia, unspecified: Secondary | ICD-10-CM

## 2014-02-01 DIAGNOSIS — R5383 Other fatigue: Secondary | ICD-10-CM

## 2014-02-01 DIAGNOSIS — I10 Essential (primary) hypertension: Secondary | ICD-10-CM

## 2014-02-01 DIAGNOSIS — I152 Hypertension secondary to endocrine disorders: Secondary | ICD-10-CM

## 2014-02-01 NOTE — Telephone Encounter (Signed)
Labs entered. Pt's wife informed.

## 2014-02-01 NOTE — Telephone Encounter (Signed)
Patient would like to come in today at lunch or tomorrow morning for his labs.  Please call patient once labs are entered.  Patient's wife can also be reach at cell [phone].

## 2014-02-01 NOTE — Telephone Encounter (Signed)
OK A1c, BMET, CBC, lipids, testosterone Thx

## 2014-02-02 ENCOUNTER — Other Ambulatory Visit (INDEPENDENT_AMBULATORY_CARE_PROVIDER_SITE_OTHER): Payer: 59

## 2014-02-02 DIAGNOSIS — E785 Hyperlipidemia, unspecified: Secondary | ICD-10-CM

## 2014-02-02 DIAGNOSIS — R5383 Other fatigue: Secondary | ICD-10-CM

## 2014-02-02 DIAGNOSIS — E1169 Type 2 diabetes mellitus with other specified complication: Secondary | ICD-10-CM

## 2014-02-02 DIAGNOSIS — E1159 Type 2 diabetes mellitus with other circulatory complications: Secondary | ICD-10-CM

## 2014-02-02 DIAGNOSIS — E119 Type 2 diabetes mellitus without complications: Secondary | ICD-10-CM

## 2014-02-02 DIAGNOSIS — E291 Testicular hypofunction: Secondary | ICD-10-CM

## 2014-02-02 DIAGNOSIS — R5381 Other malaise: Secondary | ICD-10-CM

## 2014-02-02 DIAGNOSIS — I10 Essential (primary) hypertension: Secondary | ICD-10-CM

## 2014-02-02 LAB — BASIC METABOLIC PANEL
BUN: 16 mg/dL (ref 6–23)
CO2: 29 mEq/L (ref 19–32)
Calcium: 9.3 mg/dL (ref 8.4–10.5)
Chloride: 104 mEq/L (ref 96–112)
Creatinine, Ser: 1.3 mg/dL (ref 0.4–1.5)
GFR: 57.67 mL/min — ABNORMAL LOW (ref >60.00)
Glucose, Bld: 150 mg/dL — ABNORMAL HIGH (ref 70–99)
POTASSIUM: 4.8 meq/L (ref 3.5–5.1)
SODIUM: 140 meq/L (ref 135–145)

## 2014-02-02 LAB — CBC WITH DIFFERENTIAL/PLATELET
BASOS PCT: 0.5 % (ref 0.0–3.0)
Basophils Absolute: 0 10*3/uL (ref 0.0–0.1)
EOS ABS: 0.1 10*3/uL (ref 0.0–0.7)
EOS PCT: 1.9 % (ref 0.0–5.0)
HCT: 47.8 % (ref 39.0–52.0)
HEMOGLOBIN: 16.4 g/dL (ref 13.0–17.0)
Lymphocytes Relative: 33.4 % (ref 12.0–46.0)
Lymphs Abs: 2.4 10*3/uL (ref 0.7–4.0)
MCHC: 34.2 g/dL (ref 30.0–36.0)
MCV: 94.8 fl (ref 78.0–100.0)
MONO ABS: 0.6 10*3/uL (ref 0.1–1.0)
Monocytes Relative: 8.5 % (ref 3.0–12.0)
Neutro Abs: 4 10*3/uL (ref 1.4–7.7)
Neutrophils Relative %: 55.7 % (ref 43.0–77.0)
Platelets: 191 10*3/uL (ref 150.0–400.0)
RBC: 5.05 Mil/uL (ref 4.22–5.81)
RDW: 13.1 % (ref 11.5–15.5)
WBC: 7.1 10*3/uL (ref 4.0–10.5)

## 2014-02-02 LAB — LIPID PANEL
CHOLESTEROL: 127 mg/dL (ref 0–200)
HDL: 32.6 mg/dL — ABNORMAL LOW (ref >39.00)
LDL Cholesterol: 68 mg/dL (ref 0–99)
NonHDL: 94.4
TRIGLYCERIDES: 131 mg/dL (ref 0.0–149.0)
Total CHOL/HDL Ratio: 4
VLDL: 26.2 mg/dL (ref 0.0–40.0)

## 2014-02-02 LAB — TESTOSTERONE: Testosterone: 236.26 ng/dL — ABNORMAL LOW (ref 300.00–890.00)

## 2014-02-02 LAB — HEMOGLOBIN A1C: Hgb A1c MFr Bld: 7.3 % — ABNORMAL HIGH (ref 4.6–6.5)

## 2014-02-05 ENCOUNTER — Encounter: Payer: Self-pay | Admitting: Internal Medicine

## 2014-02-05 ENCOUNTER — Ambulatory Visit (INDEPENDENT_AMBULATORY_CARE_PROVIDER_SITE_OTHER): Payer: 59 | Admitting: Internal Medicine

## 2014-02-05 VITALS — BP 150/93 | HR 77 | Temp 98.4°F | Wt 221.0 lb

## 2014-02-05 DIAGNOSIS — L570 Actinic keratosis: Secondary | ICD-10-CM

## 2014-02-05 DIAGNOSIS — I2510 Atherosclerotic heart disease of native coronary artery without angina pectoris: Secondary | ICD-10-CM

## 2014-02-05 DIAGNOSIS — E663 Overweight: Secondary | ICD-10-CM

## 2014-02-05 DIAGNOSIS — E291 Testicular hypofunction: Secondary | ICD-10-CM

## 2014-02-05 DIAGNOSIS — E119 Type 2 diabetes mellitus without complications: Secondary | ICD-10-CM

## 2014-02-05 MED ORDER — ENALAPRIL MALEATE 5 MG PO TABS: ORAL_TABLET | ORAL | Status: DC

## 2014-02-05 MED ORDER — CIPROFLOXACIN HCL 500 MG PO TABS: ORAL_TABLET | ORAL | Status: DC

## 2014-02-05 MED ORDER — BUPROPION HCL ER (XL) 150 MG PO TB24
ORAL_TABLET | ORAL | Status: DC
Start: 2014-02-05 — End: 2014-08-18

## 2014-02-05 MED ORDER — ONETOUCH ULTRASOFT LANCETS MISC: Status: DC

## 2014-02-05 MED ORDER — GLUCOSE BLOOD VI STRP: ORAL_STRIP | Status: DC

## 2014-02-05 MED ORDER — ATORVASTATIN CALCIUM 80 MG PO TABS: 80.0000 mg | ORAL_TABLET | Freq: Every day | ORAL | Status: DC

## 2014-02-05 MED ORDER — METFORMIN HCL 1000 MG PO TABS: ORAL_TABLET | ORAL | Status: DC

## 2014-02-05 MED ORDER — ATOVAQUONE-PROGUANIL HCL 250-100 MG PO TABS: ORAL_TABLET | ORAL | Status: DC

## 2014-02-05 MED ORDER — GLIPIZIDE ER 5 MG PO TB24: ORAL_TABLET | ORAL | Status: DC

## 2014-02-05 NOTE — Assessment & Plan Note (Signed)
Wt Readings from Last 3 Encounters:  02/05/14 221 lb (100.245 kg)  10/01/13 227 lb (102.967 kg)  07/06/13 220 lb (99.791 kg)

## 2014-02-05 NOTE — Progress Notes (Signed)
Pre visit review using our clinic review tool, if applicable. No additional management support is needed unless otherwise documented below in the visit note. 

## 2014-02-05 NOTE — Assessment & Plan Note (Signed)
Continue with current prescription therapy as reflected on the Med list.  

## 2014-02-05 NOTE — Progress Notes (Signed)
Subjective:     HPI     The patient presents for a follow-up of  chronic hypertension, chronic dyslipidemia, hypogonadism, type 2 diabetes controlled with medicines. He saw Dr Tamala Julian fo LBP Dr Vernie Shanks started testost injections  Wt Readings from Last 3 Encounters:  02/05/14 221 lb (100.245 kg)  10/01/13 227 lb (102.967 kg)  07/06/13 220 lb (99.791 kg)   BP Readings from Last 3 Encounters:  02/05/14 150/93  10/01/13 160/78  07/06/13 134/66       Review of Systems  Constitutional: Negative for appetite change, fatigue and unexpected weight change.  HENT: Negative for congestion, nosebleeds, sneezing and trouble swallowing.   Eyes: Negative for itching and visual disturbance.  Cardiovascular: Negative for palpitations and leg swelling.  Gastrointestinal: Negative for nausea, diarrhea, blood in stool and abdominal distention.  Genitourinary: Negative for frequency and hematuria.  Musculoskeletal: Negative for back pain, gait problem, joint swelling and neck pain.  Neurological: Negative for dizziness, tremors, speech difficulty and weakness.  Psychiatric/Behavioral: Negative for suicidal ideas, sleep disturbance, dysphoric mood and agitation. The patient is not nervous/anxious.         Objective:   Physical Exam  Vitals reviewed. Constitutional: He is oriented to person, place, and time. He appears well-developed.  HENT:  Mouth/Throat: Oropharynx is clear and moist.  eryth throat  Eyes: Conjunctivae are normal. Pupils are equal, round, and reactive to light.  Neck: Normal range of motion. No JVD present. No thyromegaly present.  Cardiovascular: Normal rate, regular rhythm, normal heart sounds and intact distal pulses.  Exam reveals no gallop and no friction rub.   No murmur heard. Pulmonary/Chest: Effort normal and breath sounds normal. No respiratory distress. He has no wheezes. He has no rales. He exhibits no tenderness.  Abdominal: Soft. Bowel sounds are normal.  He exhibits no distension and no mass. There is no tenderness. There is no rebound and no guarding.  Genitourinary: Rectum normal and prostate normal. Guaiac negative stool.  Musculoskeletal: Normal range of motion. He exhibits no edema and no tenderness.  Lymphadenopathy:    He has no cervical adenopathy.  Neurological: He is alert and oriented to person, place, and time. He has normal reflexes. No cranial nerve deficit. He exhibits normal muscle tone. Coordination normal.  Skin: Skin is warm and dry. No rash noted.  Psychiatric: He has a normal mood and affect. His behavior is normal. Judgment and thought content normal.  AK on R cheek   Lab Results  Component Value Date   WBC 7.1 02/02/2014   HGB 16.4 02/02/2014   HCT 47.8 02/02/2014   PLT 191.0 02/02/2014   GLUCOSE 150* 02/02/2014   CHOL 127 02/02/2014   TRIG 131.0 02/02/2014   HDL 32.60* 02/02/2014   LDLDIRECT 100.8 05/08/2010   LDLCALC 68 02/02/2014   ALT 21 10/03/2011   AST 20 10/03/2011   NA 140 02/02/2014   K 4.8 02/02/2014   CL 104 02/02/2014   CREATININE 1.3 02/02/2014   BUN 16 02/02/2014   CO2 29 02/02/2014   TSH 0.82 10/03/2011   PSA 0.80 10/03/2011   INR 1.3 04/08/2008   HGBA1C 7.3* 02/02/2014     Procedure Note :     Procedure : Cryosurgery   Indication:   Actinic keratosis(es)   Risks including unsuccessful procedure , bleeding, infection, bruising, scar, a need for a repeat  procedure and others were explained to the patient in detail as well as the benefits. Informed consent was obtained verbally.  1 lesion(s)  on R cheek   was/were treated with liquid nitrogen on a Q-tip in a usual fasion . Band-Aid was applied and antibiotic ointment was given for a later use.   Tolerated well. Complications none.   Postprocedure instructions :     Keep the wounds clean. You can wash them with liquid soap and water. Pat dry with gauze or a Kleenex tissue  Before applying antibiotic ointment and a Band-Aid.   You need to report immediately  if  any  signs of infection develop.             Assessment & Plan:

## 2014-02-05 NOTE — Assessment & Plan Note (Signed)
Re-start inj Testosterone

## 2014-02-09 NOTE — Patient Instructions (Signed)
   Postprocedure instructions :     Keep the wounds clean. You can wash them with liquid soap and water. Pat dry with gauze or a Kleenex tissue  Before applying antibiotic ointment and a Band-Aid.   You need to report immediately  if  any signs of infection develop.    

## 2014-02-09 NOTE — Assessment & Plan Note (Signed)
R cheek - treated today

## 2014-02-11 ENCOUNTER — Other Ambulatory Visit: Payer: Self-pay | Admitting: Internal Medicine

## 2014-02-15 ENCOUNTER — Telehealth: Payer: Self-pay | Admitting: *Deleted

## 2014-02-15 DIAGNOSIS — E785 Hyperlipidemia, unspecified: Secondary | ICD-10-CM

## 2014-02-15 DIAGNOSIS — Z125 Encounter for screening for malignant neoplasm of prostate: Secondary | ICD-10-CM

## 2014-02-15 DIAGNOSIS — E119 Type 2 diabetes mellitus without complications: Secondary | ICD-10-CM

## 2014-02-15 DIAGNOSIS — Z0000 Encounter for general adult medical examination without abnormal findings: Secondary | ICD-10-CM

## 2014-02-15 DIAGNOSIS — E291 Testicular hypofunction: Secondary | ICD-10-CM

## 2014-02-15 DIAGNOSIS — Z79899 Other long term (current) drug therapy: Secondary | ICD-10-CM

## 2014-02-15 NOTE — Telephone Encounter (Signed)
Wife called wanting additional blood work added to what is already entered for November appt. She stated he need his testosterone, PSA, Lipid, CBC. Inform wife those labs has already been done. Wanting labs check again prior to 05/31/14 appt...[name] [name]

## 2014-02-18 NOTE — Telephone Encounter (Signed)
Ok thx.

## 2014-02-19 NOTE — Telephone Encounter (Signed)
Notified pt wife md has ok labs...[name] [name]

## 2014-03-15 ENCOUNTER — Other Ambulatory Visit: Payer: Self-pay | Admitting: Internal Medicine

## 2014-03-17 ENCOUNTER — Ambulatory Visit: Payer: 59

## 2014-03-17 VITALS — BP 130/68

## 2014-03-17 DIAGNOSIS — Z0130 Encounter for examination of blood pressure without abnormal findings: Secondary | ICD-10-CM

## 2014-03-29 ENCOUNTER — Other Ambulatory Visit: Payer: Self-pay | Admitting: Internal Medicine

## 2014-04-11 ENCOUNTER — Other Ambulatory Visit: Payer: Self-pay | Admitting: Internal Medicine

## 2014-05-07 ENCOUNTER — Other Ambulatory Visit: Payer: Self-pay | Admitting: Internal Medicine

## 2014-05-13 ENCOUNTER — Other Ambulatory Visit: Payer: Self-pay | Admitting: Internal Medicine

## 2014-05-14 ENCOUNTER — Encounter: Payer: Self-pay | Admitting: Internal Medicine

## 2014-05-14 ENCOUNTER — Encounter: Payer: Self-pay | Admitting: Cardiology

## 2014-05-24 ENCOUNTER — Other Ambulatory Visit (INDEPENDENT_AMBULATORY_CARE_PROVIDER_SITE_OTHER): Payer: 59

## 2014-05-24 DIAGNOSIS — Z125 Encounter for screening for malignant neoplasm of prostate: Secondary | ICD-10-CM

## 2014-05-24 DIAGNOSIS — Z79899 Other long term (current) drug therapy: Secondary | ICD-10-CM

## 2014-05-24 DIAGNOSIS — E119 Type 2 diabetes mellitus without complications: Secondary | ICD-10-CM

## 2014-05-24 DIAGNOSIS — Z0000 Encounter for general adult medical examination without abnormal findings: Secondary | ICD-10-CM

## 2014-05-24 DIAGNOSIS — E785 Hyperlipidemia, unspecified: Secondary | ICD-10-CM

## 2014-05-24 DIAGNOSIS — E291 Testicular hypofunction: Secondary | ICD-10-CM

## 2014-05-24 LAB — CBC WITH DIFFERENTIAL/PLATELET
BASOS ABS: 0 10*3/uL (ref 0.0–0.1)
Basophils Relative: 0.4 % (ref 0.0–3.0)
Eosinophils Absolute: 0.2 10*3/uL (ref 0.0–0.7)
Eosinophils Relative: 2.8 % (ref 0.0–5.0)
HCT: 49.2 % (ref 39.0–52.0)
HEMOGLOBIN: 16.5 g/dL (ref 13.0–17.0)
LYMPHS PCT: 33.5 % (ref 12.0–46.0)
Lymphs Abs: 2.4 10*3/uL (ref 0.7–4.0)
MCHC: 33.6 g/dL (ref 30.0–36.0)
MCV: 94.8 fl (ref 78.0–100.0)
MONOS PCT: 11.6 % (ref 3.0–12.0)
Monocytes Absolute: 0.8 10*3/uL (ref 0.1–1.0)
NEUTROS ABS: 3.7 10*3/uL (ref 1.4–7.7)
NEUTROS PCT: 51.7 % (ref 43.0–77.0)
PLATELETS: 179 10*3/uL (ref 150.0–400.0)
RBC: 5.19 Mil/uL (ref 4.22–5.81)
RDW: 12.9 % (ref 11.5–15.5)
WBC: 7.2 10*3/uL (ref 4.0–10.5)

## 2014-05-24 LAB — URINALYSIS, ROUTINE W REFLEX MICROSCOPIC
Bilirubin Urine: NEGATIVE
Hgb urine dipstick: NEGATIVE
Ketones, ur: NEGATIVE
Leukocytes, UA: NEGATIVE
Nitrite: NEGATIVE
PH: 5.5 (ref 5.0–8.0)
RBC / HPF: NONE SEEN (ref 0–?)
SPECIFIC GRAVITY, URINE: 1.025 (ref 1.000–1.030)
Total Protein, Urine: 30 — AB
URINE GLUCOSE: NEGATIVE
Urobilinogen, UA: 0.2 (ref 0.0–1.0)

## 2014-05-24 LAB — BASIC METABOLIC PANEL
BUN: 20 mg/dL (ref 6–23)
CALCIUM: 9.3 mg/dL (ref 8.4–10.5)
CO2: 27 mEq/L (ref 19–32)
Chloride: 101 mEq/L (ref 96–112)
Creatinine, Ser: 1.2 mg/dL (ref 0.4–1.5)
GFR: 66.8 mL/min (ref >60.00)
Glucose, Bld: 203 mg/dL — ABNORMAL HIGH (ref 70–99)
Potassium: 4.3 mEq/L (ref 3.5–5.1)
SODIUM: 138 meq/L (ref 135–145)

## 2014-05-24 LAB — LIPID PANEL
CHOLESTEROL: 144 mg/dL (ref 0–200)
HDL: 30.2 mg/dL — AB (ref >39.00)
NonHDL: 113.8
Total CHOL/HDL Ratio: 5
Triglycerides: 231 mg/dL — ABNORMAL HIGH (ref 0.0–149.0)
VLDL: 46.2 mg/dL — ABNORMAL HIGH (ref 0.0–40.0)

## 2014-05-24 LAB — TSH: TSH: 2.02 u[IU]/mL (ref 0.35–4.50)

## 2014-05-24 LAB — TESTOSTERONE: Testosterone: 168.79 ng/dL — ABNORMAL LOW (ref 300.00–890.00)

## 2014-05-24 LAB — LDL CHOLESTEROL, DIRECT: LDL DIRECT: 77.2 mg/dL

## 2014-05-24 LAB — PSA: PSA: 0.64 ng/mL (ref 0.10–4.00)

## 2014-05-31 ENCOUNTER — Ambulatory Visit (INDEPENDENT_AMBULATORY_CARE_PROVIDER_SITE_OTHER): Payer: 59 | Admitting: Internal Medicine

## 2014-05-31 ENCOUNTER — Other Ambulatory Visit: Payer: Self-pay | Admitting: Internal Medicine

## 2014-05-31 ENCOUNTER — Encounter: Payer: Self-pay | Admitting: Internal Medicine

## 2014-05-31 VITALS — BP 118/78 | HR 94 | Temp 98.9°F | Wt 225.0 lb

## 2014-05-31 DIAGNOSIS — I2583 Coronary atherosclerosis due to lipid rich plaque: Principal | ICD-10-CM

## 2014-05-31 DIAGNOSIS — E119 Type 2 diabetes mellitus without complications: Secondary | ICD-10-CM

## 2014-05-31 DIAGNOSIS — M5440 Lumbago with sciatica, unspecified side: Secondary | ICD-10-CM

## 2014-05-31 DIAGNOSIS — I2510 Atherosclerotic heart disease of native coronary artery without angina pectoris: Secondary | ICD-10-CM

## 2014-05-31 DIAGNOSIS — E669 Obesity, unspecified: Secondary | ICD-10-CM

## 2014-05-31 DIAGNOSIS — E1169 Type 2 diabetes mellitus with other specified complication: Secondary | ICD-10-CM

## 2014-05-31 DIAGNOSIS — E785 Hyperlipidemia, unspecified: Secondary | ICD-10-CM

## 2014-05-31 NOTE — Progress Notes (Signed)
   Subjective:     HPI     The patient presents for a follow-up of  chronic hypertension, chronic dyslipidemia, hypogonadism, type 2 diabetes controlled with medicines. He saw Dr Tamala Julian fo LBP Dr Vernie Shanks started testost injections  Wt Readings from Last 3 Encounters:  05/31/14 225 lb (102.059 kg)  02/05/14 221 lb (100.245 kg)  10/01/13 227 lb (102.967 kg)   BP Readings from Last 3 Encounters:  05/31/14 118/78  03/17/14 130/68  02/05/14 150/93       Review of Systems  Constitutional: Negative for appetite change, fatigue and unexpected weight change.  HENT: Negative for congestion, nosebleeds, sneezing and trouble swallowing.   Eyes: Negative for itching and visual disturbance.  Cardiovascular: Negative for palpitations and leg swelling.  Gastrointestinal: Negative for nausea, diarrhea, blood in stool and abdominal distention.  Genitourinary: Negative for frequency and hematuria.  Musculoskeletal: Negative for back pain, joint swelling, gait problem and neck pain.  Neurological: Negative for dizziness, tremors, speech difficulty and weakness.  Psychiatric/Behavioral: Negative for suicidal ideas, sleep disturbance, dysphoric mood and agitation. The patient is not nervous/anxious.         Objective:   Physical Exam  Constitutional: He is oriented to person, place, and time. He appears well-developed. No distress.  NAD  HENT:  Mouth/Throat: Oropharynx is clear and moist.  Eyes: Conjunctivae are normal. Pupils are equal, round, and reactive to light.  Neck: Normal range of motion. No JVD present. No thyromegaly present.  Cardiovascular: Normal rate, regular rhythm, normal heart sounds and intact distal pulses.  Exam reveals no gallop and no friction rub.   No murmur heard. Pulmonary/Chest: Effort normal and breath sounds normal. No respiratory distress. He has no wheezes. He has no rales. He exhibits no tenderness.  Abdominal: Soft. Bowel sounds are normal. He exhibits  no distension and no mass. There is no tenderness. There is no rebound and no guarding.  Musculoskeletal: Normal range of motion. He exhibits no edema or tenderness.  Lymphadenopathy:    He has no cervical adenopathy.  Neurological: He is alert and oriented to person, place, and time. He has normal reflexes. No cranial nerve deficit. He exhibits normal muscle tone. He displays a negative Romberg sign. Coordination and gait normal.  No meningeal signs  Skin: Skin is warm and dry. No rash noted.  Psychiatric: He has a normal mood and affect. His behavior is normal. Judgment and thought content normal.  Obese   Lab Results  Component Value Date   WBC 7.2 05/24/2014   HGB 16.5 05/24/2014   HCT 49.2 05/24/2014   PLT 179.0 05/24/2014   GLUCOSE 203* 05/24/2014   CHOL 144 05/24/2014   TRIG 231.0* 05/24/2014   HDL 30.20* 05/24/2014   LDLDIRECT 77.2 05/24/2014   LDLCALC 68 02/02/2014   ALT 21 10/03/2011   AST 20 10/03/2011   NA 138 05/24/2014   K 4.3 05/24/2014   CL 101 05/24/2014   CREATININE 1.2 05/24/2014   BUN 20 05/24/2014   CO2 27 05/24/2014   TSH 2.02 05/24/2014   PSA 0.64 05/24/2014   INR 1.3 04/08/2008   HGBA1C 7.3* 02/02/2014       Assessment & Plan:

## 2014-05-31 NOTE — Assessment & Plan Note (Signed)
Continue with current prescription therapy as reflected on the Med list.  

## 2014-05-31 NOTE — Assessment & Plan Note (Signed)
MSK/OA - recurrent Rx prn

## 2014-05-31 NOTE — Progress Notes (Signed)
Pre visit review using our clinic review tool, if applicable. No additional management support is needed unless otherwise documented below in the visit note. 

## 2014-05-31 NOTE — Assessment & Plan Note (Signed)
Low carb diet 

## 2014-05-31 NOTE — Patient Instructions (Signed)
Low carb diet 

## 2014-06-08 ENCOUNTER — Other Ambulatory Visit: Payer: Self-pay | Admitting: Internal Medicine

## 2014-06-11 ENCOUNTER — Other Ambulatory Visit: Payer: Self-pay | Admitting: Internal Medicine

## 2014-07-08 ENCOUNTER — Other Ambulatory Visit: Payer: Self-pay

## 2014-07-08 MED ORDER — ATORVASTATIN CALCIUM 80 MG PO TABS: 80.0000 mg | ORAL_TABLET | Freq: Every day | ORAL | Status: DC

## 2014-07-09 ENCOUNTER — Other Ambulatory Visit: Payer: Self-pay | Admitting: Internal Medicine

## 2014-07-09 ENCOUNTER — Other Ambulatory Visit: Payer: Self-pay | Admitting: *Deleted

## 2014-07-09 ENCOUNTER — Encounter: Payer: Self-pay | Admitting: Cardiology

## 2014-07-09 ENCOUNTER — Ambulatory Visit (INDEPENDENT_AMBULATORY_CARE_PROVIDER_SITE_OTHER): Payer: 59 | Admitting: Cardiology

## 2014-07-09 VITALS — BP 134/62 | HR 80 | Ht 73.0 in | Wt 220.0 lb

## 2014-07-09 DIAGNOSIS — I152 Hypertension secondary to endocrine disorders: Secondary | ICD-10-CM

## 2014-07-09 DIAGNOSIS — I25810 Atherosclerosis of coronary artery bypass graft(s) without angina pectoris: Secondary | ICD-10-CM

## 2014-07-09 DIAGNOSIS — E785 Hyperlipidemia, unspecified: Secondary | ICD-10-CM

## 2014-07-09 DIAGNOSIS — E1169 Type 2 diabetes mellitus with other specified complication: Secondary | ICD-10-CM

## 2014-07-09 DIAGNOSIS — E1159 Type 2 diabetes mellitus with other circulatory complications: Secondary | ICD-10-CM

## 2014-07-09 DIAGNOSIS — I10 Essential (primary) hypertension: Secondary | ICD-10-CM

## 2014-07-09 MED ORDER — ATORVASTATIN CALCIUM 80 MG PO TABS: 80.0000 mg | ORAL_TABLET | Freq: Every day | ORAL | Status: DC

## 2014-07-09 NOTE — Assessment & Plan Note (Signed)
He underwent CABG in 2009. Nuclear study in 2011 revealed no ischemia. The physical function is normal. He does not need any type of testing this year. Next year consideration could be given to an echo or a stress echo or a nuclear stress test.

## 2014-07-09 NOTE — Assessment & Plan Note (Signed)
Blood pressures controlled. No change in therapy. 

## 2014-07-09 NOTE — Patient Instructions (Signed)
**Note De-identified Aurelie Dicenzo Obfuscation** Your physician recommends that you continue on your current medications as directed. Please refer to the Current Medication list given to you today.  Your physician wants you to follow-up in: 1 year. You will receive a reminder letter in the mail two months in advance. If you don't receive a letter, please call our office to schedule the follow-up appointment.  

## 2014-07-09 NOTE — Progress Notes (Signed)
Patient ID: [name] Sachs Castro [name], male   DOB: Sep 01, 1950, 64 y.o.   MRN: [id number]    HPI Patient is seen today for follow-up of his coronary disease. He underwent CABG 2009. He has normal LV function. He had a nuclear study in 200 11 with no ischemia. He is taking appropriate medications. He is not having any chest pain.  Allergies  Allergen Reactions  . Iohexol      Desc: itching and hives   . Sulfadiazine     Current Outpatient Prescriptions  Medication Sig Dispense Refill  . alprostadil (EDEX) 10 MCG injection 10 mcg by Intracavitary route as needed for erectile dysfunction. use no more than 3 times per week 1 each 5  . aspirin 325 MG tablet Take 1 tablet (325 mg total) by mouth daily. 100 tablet 3  . atorvastatin (LIPITOR) 80 MG tablet Take 1 tablet (80 mg total) by mouth daily. 90 tablet 3  . atovaquone-proguanil (MALARONE) 250-100 MG TABS 1 TAB DAILY 1 TO 2 DAYS BEFORE TRIP, DURING TRIP THEN FOR 7 DAYS AFTER RETURN FROM TRIP 24 tablet 0  . budesonide-formoterol (SYMBICORT) 160-4.5 MCG/ACT inhaler Inhale 2 puffs into the lungs 2 (two) times daily. (Patient taking differently: Inhale 2 puffs into the lungs 2 (two) times daily. ) 1 Inhaler 11  . buPROPion (WELLBUTRIN XL) 150 MG 24 hr tablet TAKE 1 TABLET BY MOUTH EVERY DAY 90 tablet 3  . Cholecalciferol 1000 UNITS tablet Take 1 tablet (1,000 Units total) by mouth daily. 200 tablet 3  . ciprofloxacin (CIPRO) 500 MG tablet TAKE 1 TABLET BY MOUTH TWICE A DAY 20 tablet 1  . Diclofenac Sodium (PENNSAID) 1.5 % SOLN Place 3-5 drops onto the skin 3 (three) times daily as needed. For pain     . diphenhydrAMINE (BENADRYL) 25 MG tablet Take 1-2 tablets (25-50 mg total) by mouth every 8 (eight) hours as needed for allergies. 60 tablet 5  . enalapril (VASOTEC) 5 MG tablet TAKE 2 TABLETS BY MOUTH EVERY DAY 180 tablet 3  . esomeprazole (NEXIUM) 40 MG capsule Take 1 capsule (40 mg total) by mouth 2 (two) times daily. 180 capsule 3  . glipiZIDE  (GLUCOTROL XL) 5 MG 24 hr tablet TAKE 1 TABLET BY MOUTH EVERY DAY 90 tablet 3  . ibuprofen (ADVIL,MOTRIN) 600 MG tablet TAKE 1 TABLET BY MOUTH EVERY 4 HOURS AS NEEDED FOR PAIN 30 tablet 0  . Lancets (ONETOUCH ULTRASOFT) lancets USE TO CHECK BLOOD SUGAR TWICE A DAY AS DIRECTED 200 each 3  . metFORMIN (GLUCOPHAGE) 1000 MG tablet TAKE 1 TABLET (1,000 MG TOTAL) BY MOUTH 2 (TWO) TIMES DAILY WITH A MEAL. FOR DIABETES 180 tablet 3  . niacin (NIASPAN) 500 MG CR tablet Take 1 tablet (500 mg total) by mouth at bedtime. 90 tablet 3  . nitroGLYCERIN (NITROSTAT) 0.4 MG SL tablet 1 TABLET UNDER TONGUE AT ONSET OF CHEST PAIN YOU MAY REPEAT EVERY 5 MINUTES FOR UP TO 3 DOSES. 50 tablet 3  . OMEGA 3-6-9 FATTY ACIDS PO Take 1 tablet by mouth daily.     . ONE TOUCH ULTRA TEST test strip USE TO CHECK BLOOD SUGAR TWICE A DAY 200 each 5  . promethazine (PHENERGAN) 25 MG tablet Take 1 tablet (25 mg total) by mouth 4 (four) times daily as needed for nausea. 60 tablet 11  . silodosin (RAPAFLO) 8 MG CAPS capsule Take 1 capsule (8 mg total) by mouth daily. 30 capsule 11  . tadalafil (CIALIS) 20 MG tablet Take  1 tablet (20 mg total) by mouth every three (3) days as needed for erectile dysfunction. 10 tablet 6  . testosterone cypionate (DEPO-TESTOSTERONE) 100 MG/ML injection Inject 1 mL (100 mg total) into the muscle every 7 (seven) days. For IM use only 10 mL 3  . traMADol (ULTRAM) 50 MG tablet Take 1 tablet (50 mg total) by mouth at bedtime as needed. 30 tablet 0  . valACYclovir (VALTREX) 500 MG tablet TAKE 1 TABLET BY MOUTH 3 TIMES A DAY 21 tablet 2   No current facility-administered medications for this visit.    History   Social History  . Marital Status: Married    Spouse Name: N/A    Number of Children: N/A  . Years of Education: N/A   Occupational History  . Not on file.   Social History Main Topics  . Smoking status: Never Smoker   . Smokeless tobacco: Not on file  . Alcohol Use: No  . Drug Use: No  .  Sexual Activity: Yes   Other Topics Concern  . Not on file   Social History Narrative   [name] [name] a lot   Regular exercise - NO    Family History  Problem Relation Age of Onset  . Hypertension    . Diabetes Mother   . Mental illness Mother 20    Alzheimer  . Diabetes Father   . Heart disease Father     CAD    Past Medical History  Diagnosis Date  . Diabetes mellitus   . Hyperlipidemia   . Snoring     prior history of surgery for sleep apnea  . Microalbuminuria   . Herpes simplex   . CAD (coronary artery disease)     Nuclear, Jul 04, 2009 NO ischemia  . Hx of CABG 04/2008    October, 2009  . Aortic root dilatation     slight, echo 2009  . Low testosterone 2010    LOW DHEA    Past Surgical History  Procedure Laterality Date  . Appendectomy  1978  . Tonsilectomy, adenoidectomy, bilateral myringotomy and tubes    . Removal of uvula      ENT  . Cholecystectomy  2009  . Coronary artery bypass graft  2009    Patient Active Problem List   Diagnosis Date Noted  . Lumbar back pain 06/03/2013  . BPH (benign prostatic hyperplasia) 03/10/2013  . Acute upper respiratory infections of unspecified site 01/03/2013  . Actinic keratoses 11/07/2012  . Right wrist pain 06/12/2012  . Right foot pain 06/12/2012  . Hypertension associated with diabetes 09/14/2011  . CAD (coronary artery disease)   . Preventative health care 02/19/2011  . Conjunctivitis, acute 02/19/2011  . Bronchitis, acute 02/19/2011  . GERD (gastroesophageal reflux disease) 09/22/2010  . Overweight(278.02) 01/11/2010  . HYPOGONADISM 01/14/2009  . FATIGUE 01/14/2009  . CORONARY ARTERY BYPASS GRAFT, HX OF 11/07/2008  . ERECTILE DYSFUNCTION 09/24/2008  . CONSTIPATION 06/14/2008  . Acquired cyst of kidney 11/21/2007  . ABDOMINAL PAIN 11/11/2007  . Diabetes mellitus type 2 in obese 07/09/2007  . HERPES SIMPLEX INFECTION, TYPE I 01/25/2007  . Dyslipidemia 01/25/2007  . MICROALBUMINURIA 01/25/2007    ROS    Patient denies fever, chills, headache, sweats, rash, change in vision, change in hearing, chest pain, cough, nausea or vomiting, urinary symptoms. All other systems are reviewed and are negative.  PHYSICAL EXAM Patient is oriented to person time and place. Affect is normal. Head is atraumatic. Sclera and conjunctiva are normal. There  is no jugulovenous distention. Lungs are clear. Respiratory effort is unlabored. Cardiac exam reveals S1 and S2. There are no clicks or significant murmurs. The abdomen is soft. There is no peripheral edema. There are no musculoskeletal deformities. There are no skin rashes.  Filed Vitals:   07/09/14 1408  BP: 134/62  Pulse: 80  Height: 6\' 1"  (1.854 m)  Weight: 220 lb (99.791 kg)   EKG is done today and reviewed by me. There is sinus rhythm. There is decreased anterior R-wave progression. There is no change from the past.  ASSESSMENT & PLAN

## 2014-07-09 NOTE — Assessment & Plan Note (Signed)
The patient is on appropriate treatment for his lipids. His LDL is 77.

## 2014-07-12 ENCOUNTER — Telehealth: Payer: Self-pay | Admitting: Internal Medicine

## 2014-07-12 NOTE — Telephone Encounter (Signed)
emmi emailed °

## 2014-08-07 ENCOUNTER — Other Ambulatory Visit: Payer: Self-pay | Admitting: Internal Medicine

## 2014-08-18 ENCOUNTER — Ambulatory Visit (INDEPENDENT_AMBULATORY_CARE_PROVIDER_SITE_OTHER): Payer: 59 | Admitting: Internal Medicine

## 2014-08-18 ENCOUNTER — Ambulatory Visit (INDEPENDENT_AMBULATORY_CARE_PROVIDER_SITE_OTHER)
Admission: RE | Admit: 2014-08-18 | Discharge: 2014-08-18 | Disposition: A | Discharge status: Home / Self Care | Payer: 59 | Source: Ambulatory Visit | Attending: Internal Medicine | Admitting: Internal Medicine

## 2014-08-18 ENCOUNTER — Encounter: Payer: Self-pay | Admitting: Internal Medicine

## 2014-08-18 VITALS — BP 190/70 | HR 86 | Wt 222.0 lb

## 2014-08-18 DIAGNOSIS — S20219A Contusion of unspecified front wall of thorax, initial encounter: Secondary | ICD-10-CM | POA: Insufficient documentation

## 2014-08-18 DIAGNOSIS — S20211A Contusion of right front wall of thorax, initial encounter: Secondary | ICD-10-CM

## 2014-08-18 MED ORDER — SILODOSIN 8 MG PO CAPS: 8.0000 mg | ORAL_CAPSULE | Freq: Every day | ORAL | Status: DC

## 2014-08-18 MED ORDER — METFORMIN HCL 1000 MG PO TABS: ORAL_TABLET | ORAL | Status: DC

## 2014-08-18 MED ORDER — ATORVASTATIN CALCIUM 80 MG PO TABS: 80.0000 mg | ORAL_TABLET | Freq: Every day | ORAL | Status: DC

## 2014-08-18 MED ORDER — GLIPIZIDE ER 5 MG PO TB24: ORAL_TABLET | ORAL | Status: DC

## 2014-08-18 MED ORDER — ENALAPRIL MALEATE 5 MG PO TABS: ORAL_TABLET | ORAL | Status: DC

## 2014-08-18 MED ORDER — BUPROPION HCL ER (XL) 150 MG PO TB24: ORAL_TABLET | ORAL | Status: DC

## 2014-08-18 NOTE — Assessment & Plan Note (Addendum)
Cont to monitor sx's

## 2014-08-18 NOTE — Progress Notes (Signed)
Pre visit review using our clinic review tool, if applicable. No additional management support is needed unless otherwise documented below in the visit note. 
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
## 2014-08-18 NOTE — Patient Instructions (Signed)
Call if worse

## 2014-09-10 ENCOUNTER — Other Ambulatory Visit (INDEPENDENT_AMBULATORY_CARE_PROVIDER_SITE_OTHER): Payer: 59

## 2014-09-10 DIAGNOSIS — E119 Type 2 diabetes mellitus without complications: Secondary | ICD-10-CM

## 2014-09-10 LAB — BASIC METABOLIC PANEL
BUN: 17 mg/dL (ref 6–23)
CALCIUM: 9.8 mg/dL (ref 8.4–10.5)
CHLORIDE: 104 meq/L (ref 96–112)
CO2: 29 mEq/L (ref 19–32)
CREATININE: 1.27 mg/dL (ref 0.40–1.50)
GFR: 60.71 mL/min (ref >60.00)
Glucose, Bld: 144 mg/dL — ABNORMAL HIGH (ref 70–99)
Potassium: 4.4 mEq/L (ref 3.5–5.1)
SODIUM: 140 meq/L (ref 135–145)

## 2014-09-10 LAB — HEMOGLOBIN A1C: Hgb A1c MFr Bld: 7.4 % — ABNORMAL HIGH (ref 4.6–6.5)

## 2014-09-17 ENCOUNTER — Encounter: Payer: Self-pay | Admitting: Internal Medicine

## 2014-09-17 ENCOUNTER — Ambulatory Visit (INDEPENDENT_AMBULATORY_CARE_PROVIDER_SITE_OTHER): Payer: 59 | Admitting: Internal Medicine

## 2014-09-17 ENCOUNTER — Telehealth: Payer: Self-pay | Admitting: Internal Medicine

## 2014-09-17 VITALS — BP 144/78 | HR 71 | Temp 97.5°F | Ht 73.0 in | Wt 222.5 lb

## 2014-09-17 DIAGNOSIS — E291 Testicular hypofunction: Secondary | ICD-10-CM

## 2014-09-17 DIAGNOSIS — I152 Hypertension secondary to endocrine disorders: Secondary | ICD-10-CM

## 2014-09-17 DIAGNOSIS — S20211S Contusion of right front wall of thorax, sequela: Secondary | ICD-10-CM

## 2014-09-17 DIAGNOSIS — E1169 Type 2 diabetes mellitus with other specified complication: Secondary | ICD-10-CM

## 2014-09-17 DIAGNOSIS — E1159 Type 2 diabetes mellitus with other circulatory complications: Secondary | ICD-10-CM

## 2014-09-17 DIAGNOSIS — E669 Obesity, unspecified: Secondary | ICD-10-CM

## 2014-09-17 DIAGNOSIS — I2583 Coronary atherosclerosis due to lipid rich plaque: Principal | ICD-10-CM

## 2014-09-17 DIAGNOSIS — E119 Type 2 diabetes mellitus without complications: Secondary | ICD-10-CM

## 2014-09-17 DIAGNOSIS — I10 Essential (primary) hypertension: Secondary | ICD-10-CM

## 2014-09-17 DIAGNOSIS — I2510 Atherosclerotic heart disease of native coronary artery without angina pectoris: Secondary | ICD-10-CM

## 2014-09-17 MED ORDER — ONETOUCH ULTRASOFT LANCETS MISC: Status: DC

## 2014-09-17 MED ORDER — ATOVAQUONE-PROGUANIL HCL 250-100 MG PO TABS: ORAL_TABLET | ORAL | Status: DC

## 2014-09-17 MED ORDER — CIPROFLOXACIN HCL 500 MG PO TABS: 500.0000 mg | ORAL_TABLET | Freq: Two times a day (BID) | ORAL | Status: DC

## 2014-09-17 MED ORDER — GLUCOSE BLOOD VI STRP: ORAL_STRIP | Status: DC

## 2014-09-17 MED ORDER — GLIPIZIDE ER 10 MG PO TB24: 10.0000 mg | ORAL_TABLET | Freq: Every day | ORAL | Status: DC

## 2014-09-17 NOTE — Assessment & Plan Note (Signed)
Lipitor, ASA 

## 2014-09-17 NOTE — Assessment & Plan Note (Signed)
Resolving

## 2014-09-17 NOTE — Assessment & Plan Note (Signed)
Enalapril 

## 2014-09-17 NOTE — Telephone Encounter (Signed)
Pt's spouse called want to speak to the assistant concern about [name] test strip, she said that he is testing 4 time a day. Please call pt

## 2014-09-17 NOTE — Assessment & Plan Note (Signed)
2016 re-started testosterone per Dr Diona Fanti

## 2014-09-17 NOTE — Progress Notes (Signed)
   Subjective:     HPI     The patient presents for a follow-up of  chronic hypertension, chronic dyslipidemia, hypogonadism, type 2 diabetes controlled with medicines. He saw Dr Tamala Julian fo LBP Dr Vernie Shanks started testost injections  Wt Readings from Last 3 Encounters:  09/17/14 222 lb 8 oz (100.925 kg)  08/18/14 222 lb (100.699 kg)  07/09/14 220 lb (99.791 kg)   BP Readings from Last 3 Encounters:  09/17/14 144/78  08/18/14 190/70  07/09/14 134/62       Review of Systems  Constitutional: Negative for appetite change, fatigue and unexpected weight change.  HENT: Negative for congestion, nosebleeds, sneezing and trouble swallowing.   Eyes: Negative for itching and visual disturbance.  Cardiovascular: Negative for palpitations and leg swelling.  Gastrointestinal: Negative for nausea, diarrhea, blood in stool and abdominal distention.  Genitourinary: Negative for frequency and hematuria.  Musculoskeletal: Negative for back pain, joint swelling, gait problem and neck pain.  Neurological: Negative for dizziness, tremors, speech difficulty and weakness.  Psychiatric/Behavioral: Negative for suicidal ideas, sleep disturbance, dysphoric mood and agitation. The patient is not nervous/anxious.         Objective:   Physical Exam  Constitutional: He is oriented to person, place, and time. He appears well-developed. No distress.  NAD  HENT:  Mouth/Throat: Oropharynx is clear and moist.  Eyes: Conjunctivae are normal. Pupils are equal, round, and reactive to light.  Neck: Normal range of motion. No JVD present. No thyromegaly present.  Cardiovascular: Normal rate, regular rhythm, normal heart sounds and intact distal pulses.  Exam reveals no gallop and no friction rub.   No murmur heard. Pulmonary/Chest: Effort normal and breath sounds normal. No respiratory distress. He has no wheezes. He has no rales. He exhibits no tenderness.  Abdominal: Soft. Bowel sounds are normal. He  exhibits no distension and no mass. There is no tenderness. There is no rebound and no guarding.  Musculoskeletal: Normal range of motion. He exhibits no edema or tenderness.  Lymphadenopathy:    He has no cervical adenopathy.  Neurological: He is alert and oriented to person, place, and time. He has normal reflexes. No cranial nerve deficit. He exhibits normal muscle tone. He displays a negative Romberg sign. Coordination and gait normal.  No meningeal signs  Skin: Skin is warm and dry. No rash noted.  Psychiatric: He has a normal mood and affect. His behavior is normal. Judgment and thought content normal.  Obese   Lab Results  Component Value Date   WBC 7.2 05/24/2014   HGB 16.5 05/24/2014   HCT 49.2 05/24/2014   PLT 179.0 05/24/2014   GLUCOSE 144* 09/10/2014   CHOL 144 05/24/2014   TRIG 231.0* 05/24/2014   HDL 30.20* 05/24/2014   LDLDIRECT 77.2 05/24/2014   LDLCALC 68 02/02/2014   ALT 21 10/03/2011   AST 20 10/03/2011   NA 140 09/10/2014   K 4.4 09/10/2014   CL 104 09/10/2014   CREATININE 1.27 09/10/2014   BUN 17 09/10/2014   CO2 29 09/10/2014   TSH 2.02 05/24/2014   PSA 0.64 05/24/2014   INR 1.3 04/08/2008   HGBA1C 7.4* 09/10/2014       Assessment & Plan:

## 2014-09-17 NOTE — Assessment & Plan Note (Addendum)
Chronic - worse Glipizide - increase to 10 mg, Metformin Labs

## 2014-09-17 NOTE — Progress Notes (Signed)
Pre visit review using our clinic review tool, if applicable. No additional management support is needed unless otherwise documented below in the visit note. 

## 2014-09-21 ENCOUNTER — Other Ambulatory Visit: Payer: Self-pay | Admitting: *Deleted

## 2014-09-21 MED ORDER — ONETOUCH ULTRASOFT LANCETS MISC: Status: DC

## 2014-09-21 MED ORDER — GLUCOSE BLOOD VI STRP: ORAL_STRIP | Status: DC

## 2014-09-21 NOTE — Telephone Encounter (Signed)
I called pt's wife- no answer/unable to leave vm. Will send new rx for qid testing strips.

## 2014-10-04 ENCOUNTER — Other Ambulatory Visit: Payer: Self-pay | Admitting: Internal Medicine

## 2014-11-02 ENCOUNTER — Other Ambulatory Visit: Payer: Self-pay | Admitting: Internal Medicine

## 2014-11-02 ENCOUNTER — Telehealth: Payer: Self-pay | Admitting: Internal Medicine

## 2014-11-02 NOTE — Telephone Encounter (Signed)
Patient needs prescriptions for glucose blood (ONE TOUCH ULTRA TEST) test strip [[id number]] and Lancets (ONETOUCH ULTRASOFT) lancets [[id number] needs fixed to be 30 day supply for 5x a day and needs to be filled today, because he will be leaving the country. You may want to call his wife just be sure that it will be filled correctly. Pharmacy will be [facility] on [address].

## 2014-11-02 NOTE — Telephone Encounter (Signed)
Per pharmacy he only needs Rf on One touch delica  95F  # 473.   Per pharmacy he filled strips # 100 10/17/14.  Does pt need to be checking CBGs 5 x daily? Will insurance cover?

## 2014-11-03 MED ORDER — GLUCOSE BLOOD VI STRP: 1.0000 | ORAL_STRIP | Freq: Four times a day (QID) | Status: DC

## 2014-11-03 MED ORDER — GLUCOSE BLOOD VI STRP
1.0000 | ORAL_STRIP | Freq: Four times a day (QID) | Status: DC
Start: 2014-11-03 — End: 2014-11-03

## 2014-11-03 MED ORDER — ONETOUCH ULTRASOFT LANCETS MISC: Status: DC

## 2014-11-03 NOTE — Telephone Encounter (Signed)
Insurance will not cover testing CBG 5 x daily. Refills sent same as before. See meds.

## 2014-11-03 NOTE — Telephone Encounter (Deleted)
Done

## 2014-11-04 MED ORDER — GLUCOSE BLOOD VI STRP: 1.0000 | ORAL_STRIP | Freq: Every day | Status: DC

## 2014-11-04 MED ORDER — GLUCOSE BLOOD VI STRP: 1.0000 | ORAL_STRIP | Status: DC

## 2014-11-04 NOTE — Telephone Encounter (Signed)
She called in again. I told her about insurance won't cover 5 x a day and she says pharmacy suggested writing 5 x daily for 30 days. Also she would want a written prescription. Please call Patient to discuss

## 2014-11-04 NOTE — Telephone Encounter (Signed)
Rx updated for pt's test strips to check blood sugar 5 times daily. I spoke to pt's wife, [name] [name]. She seemed a little confused as to what was needed. I informed her since pt is not on insulin it is not recommended for him to check his blood sugar 5 times daily and his test strips and lancets will most likely not be covered for him to test that frequent. She states the pharmacist advised to have it written test 5 x daily.  She request Rx change anyway, Rx updated/printed/pendning MD sig and will mail to pt's home once MD signs.  After I printed one Rx for # 150 at 5 times daily, [name] [name] called back requesting an additional script for # 100  Use as directed. Both rxs printed/pending MD sig. Will mail to pt tomorrow.

## 2014-11-04 NOTE — Telephone Encounter (Signed)
Please call pts wife to discuss this.

## 2014-11-04 NOTE — Addendum Note (Signed)
Addended by: Cresenciano Lick on: 11/04/2014 05:00 PM   Modules accepted: Orders, Medications

## 2014-12-01 ENCOUNTER — Other Ambulatory Visit: Payer: Self-pay | Admitting: Internal Medicine

## 2014-12-24 ENCOUNTER — Other Ambulatory Visit (INDEPENDENT_AMBULATORY_CARE_PROVIDER_SITE_OTHER): Payer: 59

## 2014-12-24 DIAGNOSIS — E119 Type 2 diabetes mellitus without complications: Secondary | ICD-10-CM | POA: Diagnosis not present

## 2014-12-24 LAB — BASIC METABOLIC PANEL
BUN: 17 mg/dL (ref 6–23)
CO2: 29 meq/L (ref 19–32)
CREATININE: 1.28 mg/dL (ref 0.40–1.50)
Calcium: 9.5 mg/dL (ref 8.4–10.5)
Chloride: 103 mEq/L (ref 96–112)
GFR: 60.11 mL/min (ref >60.00)
Glucose, Bld: 219 mg/dL — ABNORMAL HIGH (ref 70–99)
Potassium: 5 mEq/L (ref 3.5–5.1)
Sodium: 139 mEq/L (ref 135–145)

## 2014-12-24 LAB — HEMOGLOBIN A1C: HEMOGLOBIN A1C: 6.8 % — AB (ref 4.6–6.5)

## 2015-01-07 ENCOUNTER — Ambulatory Visit (INDEPENDENT_AMBULATORY_CARE_PROVIDER_SITE_OTHER): Payer: 59 | Admitting: Internal Medicine

## 2015-01-07 ENCOUNTER — Encounter: Payer: Self-pay | Admitting: Internal Medicine

## 2015-01-07 VITALS — BP 118/80 | HR 84 | Ht 73.0 in | Wt 219.0 lb

## 2015-01-07 DIAGNOSIS — E119 Type 2 diabetes mellitus without complications: Secondary | ICD-10-CM

## 2015-01-07 DIAGNOSIS — Z7189 Other specified counseling: Secondary | ICD-10-CM | POA: Diagnosis not present

## 2015-01-07 DIAGNOSIS — I10 Essential (primary) hypertension: Secondary | ICD-10-CM

## 2015-01-07 DIAGNOSIS — E1159 Type 2 diabetes mellitus with other circulatory complications: Secondary | ICD-10-CM

## 2015-01-07 DIAGNOSIS — E669 Obesity, unspecified: Secondary | ICD-10-CM

## 2015-01-07 DIAGNOSIS — E1169 Type 2 diabetes mellitus with other specified complication: Secondary | ICD-10-CM | POA: Diagnosis not present

## 2015-01-07 DIAGNOSIS — Z951 Presence of aortocoronary bypass graft: Secondary | ICD-10-CM

## 2015-01-07 DIAGNOSIS — I152 Hypertension secondary to endocrine disorders: Secondary | ICD-10-CM

## 2015-01-07 DIAGNOSIS — Z7184 Encounter for health counseling related to travel: Secondary | ICD-10-CM

## 2015-01-07 MED ORDER — CIPROFLOXACIN HCL 500 MG PO TABS: ORAL_TABLET | ORAL | Status: DC

## 2015-01-07 MED ORDER — ATOVAQUONE-PROGUANIL HCL 250-100 MG PO TABS: ORAL_TABLET | ORAL | Status: DC

## 2015-01-07 NOTE — Assessment & Plan Note (Signed)
October, 2009 ASA, Lipitor

## 2015-01-07 NOTE — Progress Notes (Signed)
Pre visit review using our clinic review tool, if applicable. No additional management support is needed unless otherwise documented below in the visit note. 

## 2015-01-07 NOTE — Assessment & Plan Note (Signed)
Chronic  Enalapril

## 2015-01-07 NOTE — Assessment & Plan Note (Signed)
Multiple trips for work [place], Cipro Rx

## 2015-01-07 NOTE — Assessment & Plan Note (Signed)
Chronic Glipizide, Metformin
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
## 2015-01-28 ENCOUNTER — Other Ambulatory Visit: Payer: Self-pay | Admitting: Internal Medicine

## 2015-02-23 ENCOUNTER — Other Ambulatory Visit: Payer: Self-pay | Admitting: Internal Medicine

## 2015-02-24 ENCOUNTER — Other Ambulatory Visit: Payer: Self-pay | Admitting: Internal Medicine

## 2015-02-25 ENCOUNTER — Other Ambulatory Visit: Payer: Self-pay | Admitting: Internal Medicine

## 2015-02-25 NOTE — Telephone Encounter (Signed)
Please advise, thanks.

## 2015-03-01 ENCOUNTER — Other Ambulatory Visit: Payer: Self-pay

## 2015-03-01 MED ORDER — CIPROFLOXACIN HCL 500 MG PO TABS: ORAL_TABLET | ORAL | Status: DC

## 2015-04-24 ENCOUNTER — Other Ambulatory Visit: Payer: Self-pay | Admitting: Internal Medicine

## 2015-05-06 ENCOUNTER — Other Ambulatory Visit (INDEPENDENT_AMBULATORY_CARE_PROVIDER_SITE_OTHER): Payer: 59

## 2015-05-06 ENCOUNTER — Ambulatory Visit: Payer: 59 | Admitting: Internal Medicine

## 2015-05-06 DIAGNOSIS — E119 Type 2 diabetes mellitus without complications: Secondary | ICD-10-CM

## 2015-05-06 LAB — BASIC METABOLIC PANEL
BUN: 19 mg/dL (ref 6–23)
CO2: 29 mEq/L (ref 19–32)
Calcium: 10.3 mg/dL (ref 8.4–10.5)
Chloride: 103 mEq/L (ref 96–112)
Creatinine, Ser: 1.22 mg/dL (ref 0.40–1.50)
GFR: 63.46 mL/min (ref >60.00)
Glucose, Bld: 189 mg/dL — ABNORMAL HIGH (ref 70–99)
POTASSIUM: 4.8 meq/L (ref 3.5–5.1)
SODIUM: 141 meq/L (ref 135–145)

## 2015-05-06 LAB — HEMOGLOBIN A1C: HEMOGLOBIN A1C: 6.9 % — AB (ref 4.6–6.5)

## 2015-05-16 ENCOUNTER — Encounter: Payer: Self-pay | Admitting: Cardiovascular Disease

## 2015-05-20 ENCOUNTER — Encounter: Payer: Self-pay | Admitting: Internal Medicine

## 2015-05-20 ENCOUNTER — Ambulatory Visit (INDEPENDENT_AMBULATORY_CARE_PROVIDER_SITE_OTHER): Payer: 59 | Admitting: Internal Medicine

## 2015-05-20 VITALS — BP 140/80 | HR 76 | Wt 223.0 lb

## 2015-05-20 DIAGNOSIS — E785 Hyperlipidemia, unspecified: Secondary | ICD-10-CM

## 2015-05-20 DIAGNOSIS — E119 Type 2 diabetes mellitus without complications: Secondary | ICD-10-CM | POA: Diagnosis not present

## 2015-05-20 DIAGNOSIS — I2510 Atherosclerotic heart disease of native coronary artery without angina pectoris: Secondary | ICD-10-CM

## 2015-05-20 DIAGNOSIS — E1169 Type 2 diabetes mellitus with other specified complication: Secondary | ICD-10-CM

## 2015-05-20 DIAGNOSIS — E291 Testicular hypofunction: Secondary | ICD-10-CM

## 2015-05-20 DIAGNOSIS — Z23 Encounter for immunization: Secondary | ICD-10-CM

## 2015-05-20 DIAGNOSIS — E669 Obesity, unspecified: Secondary | ICD-10-CM

## 2015-05-20 DIAGNOSIS — I2583 Coronary atherosclerosis due to lipid rich plaque: Secondary | ICD-10-CM

## 2015-05-20 NOTE — Progress Notes (Signed)
Pre visit review using our clinic review tool, if applicable. No additional management support is needed unless otherwise documented below in the visit note. 

## 2015-05-20 NOTE — Assessment & Plan Note (Signed)
Lipitor, ASA 

## 2015-05-20 NOTE — Assessment & Plan Note (Signed)
Chronic Glipizide, Metformin 
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
## 2015-05-20 NOTE — Assessment & Plan Note (Signed)
Chronic   Potential benefits of a long term sex steroid  use as well as potential risks  and complications were explained to the patient and were aknowledged. 2016 re-started testosterone per Dr Diona Fanti

## 2015-05-20 NOTE — Assessment & Plan Note (Signed)
On Lipitor 

## 2015-05-22 ENCOUNTER — Encounter: Payer: Self-pay | Admitting: Internal Medicine

## 2015-05-22 ENCOUNTER — Other Ambulatory Visit: Payer: Self-pay | Admitting: Internal Medicine

## 2015-05-24 ENCOUNTER — Other Ambulatory Visit: Payer: Self-pay | Admitting: Internal Medicine

## 2015-06-06 ENCOUNTER — Encounter: Payer: Self-pay | Admitting: Internal Medicine

## 2015-06-17 ENCOUNTER — Ambulatory Visit (INDEPENDENT_AMBULATORY_CARE_PROVIDER_SITE_OTHER): Payer: 59 | Admitting: Internal Medicine

## 2015-06-17 ENCOUNTER — Encounter: Payer: Self-pay | Admitting: Internal Medicine

## 2015-06-17 VITALS — BP 132/80 | HR 81 | Temp 98.3°F | Resp 20 | Ht 74.0 in | Wt 221.4 lb

## 2015-06-17 DIAGNOSIS — R197 Diarrhea, unspecified: Secondary | ICD-10-CM | POA: Insufficient documentation

## 2015-06-17 DIAGNOSIS — Z23 Encounter for immunization: Secondary | ICD-10-CM | POA: Diagnosis not present

## 2015-06-17 NOTE — Progress Notes (Signed)
Pre visit review using our clinic review tool, if applicable. No additional management support is needed unless otherwise documented below in the visit note. 
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
## 2015-06-18 ENCOUNTER — Encounter: Payer: Self-pay | Admitting: Internal Medicine

## 2015-06-19 ENCOUNTER — Other Ambulatory Visit: Payer: Self-pay | Admitting: Internal Medicine

## 2015-06-20 ENCOUNTER — Encounter: Payer: Self-pay | Admitting: Internal Medicine

## 2015-06-20 ENCOUNTER — Other Ambulatory Visit: Payer: Self-pay | Admitting: Internal Medicine

## 2015-06-20 ENCOUNTER — Other Ambulatory Visit: Payer: 59

## 2015-06-20 DIAGNOSIS — R197 Diarrhea, unspecified: Secondary | ICD-10-CM

## 2015-06-21 ENCOUNTER — Other Ambulatory Visit: Payer: Self-pay | Admitting: Cardiology

## 2015-06-21 LAB — C. DIFFICILE GDH AND TOXIN A/B
C. DIFFICILE GDH: NOT DETECTED
C. difficile Toxin A/B: NOT DETECTED

## 2015-06-21 LAB — GIARDIA/CRYPTOSPORIDIUM (EIA)
Cryptosporidium Screen (EIA): NEGATIVE
GIARDIA SCREEN (EIA): NEGATIVE

## 2015-06-21 LAB — FECAL LACTOFERRIN, QUANT: Lactoferrin: NEGATIVE

## 2015-06-21 MED ORDER — ATORVASTATIN CALCIUM 80 MG PO TABS: 80.0000 mg | ORAL_TABLET | Freq: Every day | ORAL | Status: DC

## 2015-07-20 ENCOUNTER — Other Ambulatory Visit: Payer: Self-pay | Admitting: Internal Medicine

## 2015-07-21 NOTE — Telephone Encounter (Signed)
Must be approve by Dr. Alain Marion on his desk to for approval.../lmb

## 2015-07-21 NOTE — Telephone Encounter (Signed)
pts wife is following up on the prescriptions

## 2015-08-15 ENCOUNTER — Ambulatory Visit (INDEPENDENT_AMBULATORY_CARE_PROVIDER_SITE_OTHER): Payer: 59 | Admitting: Family Medicine

## 2015-08-15 ENCOUNTER — Encounter: Payer: Self-pay | Admitting: Family Medicine

## 2015-08-15 ENCOUNTER — Other Ambulatory Visit: Payer: 59

## 2015-08-15 ENCOUNTER — Other Ambulatory Visit (INDEPENDENT_AMBULATORY_CARE_PROVIDER_SITE_OTHER): Payer: 59

## 2015-08-15 VITALS — BP 136/86 | HR 81 | Ht 74.0 in | Wt 225.0 lb

## 2015-08-15 DIAGNOSIS — M25521 Pain in right elbow: Secondary | ICD-10-CM

## 2015-08-15 DIAGNOSIS — M7021 Olecranon bursitis, right elbow: Secondary | ICD-10-CM

## 2015-08-15 MED ORDER — INDOMETHACIN ER 75 MG PO CPCR: 75.0000 mg | ORAL_CAPSULE | Freq: Two times a day (BID) | ORAL | Status: DC

## 2015-08-15 MED ORDER — DICLOFENAC SODIUM 2 % TD SOLN: TRANSDERMAL | Status: DC

## 2015-08-15 NOTE — Assessment & Plan Note (Signed)
Patient did have aspiration done today. We discussed icing regimen. Patient will have aspiration sent down to the lab for further evaluation. I do think that it is likely gout related. Refill of indomethacin given as well as topical anti-inflammatories. Patient will try compression sleeve. Follow-up in 2-3 weeks for further evaluation and treatment.

## 2015-08-15 NOTE — Progress Notes (Signed)
Pre visit review using our clinic review tool, if applicable. No additional management support is needed unless otherwise documented below in the visit note. 

## 2015-08-15 NOTE — Progress Notes (Signed)
[facility] [place],  [id number] Phone: [phone] Subjective:    I'm seeing this patient by the request  of:  Walker Kehr, MD   CC: right elbow pain  [id number] [name]. is a 65 y.o. male coming in with complaint of starting 4 days ago patient said having swelling as well as irritation of the posterior aspect of his elbow. Seems to be getting worse over the course of time. Denies any true injury. States that it was very sore and may be even some mild redness. Patient does have a past medical history significant for diabetes as well as gout. Does not have flared quite some time. Patient states that the redness and some of the swelling is gone away but continues to have discomfort. If he hits his elbow against something he has severe pain. Can even wake him up at night. Denies any radiation down the arm or any numbness or weakness. Rates the severity of pain now is 4 out of 10. At its worse it was 9 out of 10.     Past Medical History  Diagnosis Date  . Diabetes mellitus   . Hyperlipidemia   . Snoring     prior history of surgery for sleep apnea  . Microalbuminuria   . Herpes simplex   . CAD (coronary artery disease)     Nuclear, Jul 04, 2009 NO ischemia  . Hx of CABG 04/2008    October, 2009  . Aortic root dilatation (HCC)     slight, echo 2009  . Low testosterone 2010    LOW DHEA   Past Surgical History  Procedure Laterality Date  . Appendectomy  1978  . Tonsilectomy, adenoidectomy, bilateral myringotomy and tubes    . Removal of uvula      ENT  . Cholecystectomy  2009  . Coronary artery bypass graft  2009   Social History   Social History  . Marital Status: Married    Spouse Name: N/A  . Number of Children: N/A  . Years of Education: N/A   Social History Main Topics  . Smoking status: Never Smoker   . Smokeless tobacco: None  . Alcohol Use: No  . Drug Use: No  . Sexual Activity: Yes    Other Topics Concern  . None   Social History Narrative   Travels a lot   Regular exercise - NO   Allergies  Allergen Reactions  . Iohexol      Desc: itching and hives   . Sulfadiazine    Family History  Problem Relation Age of Onset  . Hypertension    . Diabetes Mother   . Mental illness Mother 54    Alzheimer  . Diabetes Father   . Heart disease Father     CAD    Past medical history, social, surgical and family history all reviewed in electronic medical record.  No pertanent information unless stated regarding to the chief complaint.   Review of Systems: No headache, visual changes, nausea, vomiting, diarrhea, constipation, dizziness, abdominal pain, skin rash, fevers, chills, night sweats, weight loss, swollen lymph nodes, body aches, joint swelling, muscle aches, chest pain, shortness of breath, mood changes.   Objective Blood pressure 136/86, pulse 81, height 6\' 2"  (1.88 m), weight 225 lb (102.059 kg), SpO2 95 %.  General: No apparent distress alert and oriented x3 mood and affect normal, dressed appropriately.  HEENT: Pupils equal, extraocular movements intact  Respiratory: Patient's  speak in full sentences and does not appear short of breath  Cardiovascular: No lower extremity edema, non tender, no erythema  Skin: Warm dry intact with no signs of infection or rash on extremities or on axial skeleton.  Abdomen: Soft nontender  Neuro: Cranial nerves II through XII are intact, neurovascularly intact in all extremities with 2+ DTRs and 2+ pulses.  Lymph: No lymphadenopathy of posterior or anterior cervical chain or axillae bilaterally.  Gait normal with good balance and coordination.  MSK:  Non tender with full range of motion and good stability and symmetric strength and tone of shoulders,  wrist, hip, knee and ankles bilaterally.  Elbow:right Enlargement of olecranon bursa that is tender to palpation Range of motion full pronation, supination, flexion,  extension. Strength is full to all of the above directions Stable to varus, valgus stress. Negative moving valgus stress test. Tender over the olecranon bursa only Ulnar nerve does not sublux. Negative cubital tunnel Tinel's. Contralateral elbow unremarkable  Musculoskeletal ultrasound was performed and interpreted by Charlann Boxer D.O.   Elbow:  Lateral epicondyle and common extensor tendon origin visualized.  Very minimal hypoechoic changes in this areaNo edema, effusions, or avulsions seen.  Radial head unremarkable and located in annular ligament Medial epicondyle and common flexor tendon origin visualized.  No edema, effusions, or avulsions seen. Ulnar nerve in cubital tunnel unremarkable. Olecranon is enlarged with some mild soft tissue swelling surrounding. Power doppler signal normal.  IMPRESSION:  Olecranon bursitis  Procedure: Real-time Ultrasound Guided Injection of right elbow olecranon bursa Device: GE Logiq E  Ultrasound guided injection is preferred based studies that show increased duration, increased effect, greater accuracy, decreased procedural pain, increased response rate, and decreased cost with ultrasound guided versus blind injection.  Verbal informed consent obtained.  Time-out conducted.  Noted no overlying erythema, induration, or other signs of local infection.  Skin prepped in a sterile fashion.  Local anesthesia: Topical Ethyl chloride.  With sterile technique and under real time ultrasound guidance:  With a 21-gauge 2 inch needle patient was injected with a total of 2 mL of 0.5% Marcaine and 1 mL of Kenalog 40 mg/dL. Aspirated fluid that was yellow in color. Amount of 5 mL. Completed without difficulty  Pain immediately resolved suggesting accurate placement of the medication.  Advised to call if fevers/chills, erythema, induration, drainage, or persistent bleeding.  Images permanently stored and available for review in the ultrasound unit.  Impression:  Technically successful ultrasound guided injection.   Impression and Recommendations:     This case required medical decision making of moderate complexity.      Note: This dictation was prepared with Dragon dictation along with smaller phrase technology. Any transcriptional errors that result from this process are unintentional.

## 2015-08-15 NOTE — Patient Instructions (Signed)
Great to see you! Ice 20 minutes 2 times daily. Usually after activity and before bed. Consider compression sleeve in the area for the next week r 2.  Look at [facility], [facility], [facility].  pennsaid pinkie amount topically 2 times daily as needed.  If any worsening gout we may need to consider medicine daily.  See me again in 2-3 weeks if it is not gone.

## 2015-08-16 ENCOUNTER — Other Ambulatory Visit: Payer: Self-pay | Admitting: Internal Medicine

## 2015-08-16 ENCOUNTER — Telehealth: Payer: Self-pay | Admitting: Internal Medicine

## 2015-08-16 LAB — SYNOVIAL CELL COUNT + DIFF, W/ CRYSTALS: WBC, SYNOVIAL: 315 uL — AB (ref 0–200)

## 2015-08-16 LAB — URIC ACID, SYNOVIAL FLUID: URIC ACID, SYNOVIAL FLUID: 2.8 mg/dL (ref 0.0–8.0)

## 2015-08-16 NOTE — Telephone Encounter (Signed)
Please advise on valtrex rx refill request, thanks

## 2015-08-18 NOTE — Telephone Encounter (Signed)
Received call from pharmacist checking status on refills. Ok refills gave verbally...[name]

## 2015-08-19 ENCOUNTER — Other Ambulatory Visit: Payer: Self-pay | Admitting: Internal Medicine

## 2015-08-19 LAB — BODY FLUID CULTURE
Gram Stain: NONE SEEN
Organism ID, Bacteria: NO GROWTH

## 2015-08-25 ENCOUNTER — Ambulatory Visit (INDEPENDENT_AMBULATORY_CARE_PROVIDER_SITE_OTHER): Payer: 59 | Admitting: Internal Medicine

## 2015-08-25 ENCOUNTER — Encounter: Payer: Self-pay | Admitting: Internal Medicine

## 2015-08-25 VITALS — BP 118/76 | HR 88 | Ht 74.0 in | Wt 221.0 lb

## 2015-08-25 DIAGNOSIS — Z1211 Encounter for screening for malignant neoplasm of colon: Secondary | ICD-10-CM

## 2015-08-25 DIAGNOSIS — R194 Change in bowel habit: Secondary | ICD-10-CM | POA: Diagnosis not present

## 2015-08-25 DIAGNOSIS — R143 Flatulence: Secondary | ICD-10-CM

## 2015-08-25 DIAGNOSIS — R197 Diarrhea, unspecified: Secondary | ICD-10-CM

## 2015-08-25 MED ORDER — NA SULFATE-K SULFATE-MG SULF 17.5-3.13-1.6 GM/177ML PO SOLN: 1.0000 | Freq: Once | ORAL | Status: DC

## 2015-08-25 NOTE — Patient Instructions (Signed)
You have been scheduled for a colonoscopy. Please follow written instructions given to you at your visit today.  Please pick up your prep supplies at the pharmacy within the next 1-3 days. If you use inhalers (even only as needed), please bring them with you on the day of your procedure.   

## 2015-08-25 NOTE — Progress Notes (Signed)
HISTORY OF PRESENT ILLNESS:  [name]. is a 65 y.o. male who is referred by his primary care provider Dr. Cristie Hem [name] with chief complaint of change in bowel habits. I last saw the patient in 2007 when he underwent routine screening colonoscopy. Complaints at that time included constipation. Examination was normal except for rare sigmoid diverticula. Routine follow-up in 10 years recommended. The patient reports to me that since gallbladder surgery in 2009 he will experience intermittent diarrhea, generally in the morning and subsequently followed by more solid stool. His bowel habit pattern was unchanged until about 4 months ago when he developed issues with either diarrhea or the need to strain. Also, sensation of needing to defecate with gas only. Describes borborygmi. Also noticed that the shape of his feces is a bit more narrow and long. He brings with him today multiple photographs of his stool in the toilet bowl for my review. Clearly has formed but somewhat shredded. No blood. He does have some mild occasional abdominal discomfort and nausea. No melena or hematochezia. He does travel to [name] with his job. Evaluation for these complaints in December 2 multiple stool studies which were negative. Basic chemistries in November were normal. Patient has had diabetes mellitus for 20 years. He does not take either ciprofloxacin or Nexium as listed on his medication sheet. He does take metformin but there has been no change in years to the dosage. No indomethacin for greater than one year. He denies over-the-counter medications. He does state that he uses sugar free drinks regularly which subsequently will result in the need for urgent defecation often loose.  REVIEW OF SYSTEMS:  All non-GI ROS negative except for back pain, cough, fatigue, shortness of breath  Past Medical History  Diagnosis Date  . Diabetes mellitus   . Hyperlipidemia   . Snoring     prior history of surgery  for sleep apnea  . Microalbuminuria   . Herpes simplex   . CAD (coronary artery disease)     Nuclear, Jul 04, 2009 NO ischemia  . Hx of CABG 04/2008    October, 2009  . Aortic root dilatation (HCC)     slight, echo 2009  . Low testosterone 2010    LOW DHEA  . Diverticulosis     Past Surgical History  Procedure Laterality Date  . Appendectomy  1978  . Tonsilectomy, adenoidectomy, bilateral myringotomy and tubes    . Removal of uvula      ENT  . Cholecystectomy  2009  . Coronary artery bypass graft  2009    Social History [name] [employer].  reports that he has never smoked. He has never used smokeless tobacco. He reports that he does not drink alcohol or use illicit drugs.  family history includes Diabetes in his father and mother; Heart disease in his father; Mental illness (age of onset: 81) in his mother.  Allergies  Allergen Reactions  . Iohexol      Desc: itching and hives   . Sulfadiazine        PHYSICAL EXAMINATION: Vital signs: BP 118/76 mmHg  Pulse 88  Ht 6\' 2"  (1.88 m)  Wt 221 lb (100.245 kg)  BMI 28.36 kg/m2  Constitutional: generally well-appearing, no acute distress Psychiatric: alert and oriented x3, cooperative Eyes: extraocular movements intact, anicteric, conjunctiva pink Mouth: oral pharynx moist, no lesions Neck: supple without thyromegaly Lymph: no lymphadenopathy Cardiovascular: heart regular rate and rhythm, no rub or gallop Lungs: clear to auscultation bilaterally  Abdomen: soft, nontender, nondistended, no obvious ascites, no peritoneal signs, normal bowel sounds, no organomegaly Rectal: To be performed at upcoming colonoscopy Extremities: no clubbing cyanosis or lower extremity edema bilaterally Skin: no lesions on visible extremities Neuro: No focal deficits. Normal DTRs. Cranial nerves intact  ASSESSMENT:  #1. Change in bowel habits with less formed stool and change in shape. Rule out bacterial overgrowth, microscopic colitis,  osmotic effect from sugar free drinks, sprue #2. Increased intestinal gas. Rule out bacterial overgrowth in diabetic #3. Colon cancer screening. Due for repeat screening as it has been 10 years since his last exam   PLAN:  #1. Prescribe metronidazole 250 mg 3 times a day 10 days #2. Schedule screening colonoscopy. Can obtain biopsies as well if change in bowel habits not resolved to rule out microscopic colitis.The nature of the procedure, as well as the risks, benefits, and alternatives were carefully and thoroughly reviewed with the patient. Ample time for discussion and questions allowed. The patient understood, was satisfied, and agreed to proceed. #3. Adjust diabetic medications for the procedure to avoid and wanted hypoglycemia. Instructed #4. Celiac testing with serology. Upcoming #5. Avoid sugar free drinks to see if there is specific impact on bowel habits   A copy of this consultation and has been sent to Dr. Alain [name]

## 2015-08-31 ENCOUNTER — Ambulatory Visit: Payer: 59 | Admitting: Family Medicine

## 2015-09-09 ENCOUNTER — Other Ambulatory Visit (INDEPENDENT_AMBULATORY_CARE_PROVIDER_SITE_OTHER): Payer: 59

## 2015-09-09 DIAGNOSIS — E119 Type 2 diabetes mellitus without complications: Secondary | ICD-10-CM | POA: Diagnosis not present

## 2015-09-09 LAB — BASIC METABOLIC PANEL
BUN: 18 mg/dL (ref 6–23)
CALCIUM: 9.7 mg/dL (ref 8.4–10.5)
CO2: 28 mEq/L (ref 19–32)
Chloride: 103 mEq/L (ref 96–112)
Creatinine, Ser: 1.26 mg/dL (ref 0.40–1.50)
GFR: 61.07 mL/min (ref >60.00)
GLUCOSE: 202 mg/dL — AB (ref 70–99)
Potassium: 4.8 mEq/L (ref 3.5–5.1)
SODIUM: 140 meq/L (ref 135–145)

## 2015-09-09 LAB — HEMOGLOBIN A1C: HEMOGLOBIN A1C: 7.9 % — AB (ref 4.6–6.5)

## 2015-09-14 ENCOUNTER — Other Ambulatory Visit: Payer: Self-pay | Admitting: *Deleted

## 2015-09-15 ENCOUNTER — Other Ambulatory Visit: Payer: Self-pay | Admitting: Internal Medicine

## 2015-09-16 ENCOUNTER — Encounter: Payer: Self-pay | Admitting: Internal Medicine

## 2015-09-16 ENCOUNTER — Other Ambulatory Visit: Payer: Self-pay | Admitting: *Deleted

## 2015-09-16 ENCOUNTER — Ambulatory Visit (INDEPENDENT_AMBULATORY_CARE_PROVIDER_SITE_OTHER): Payer: 59 | Admitting: Internal Medicine

## 2015-09-16 ENCOUNTER — Telehealth: Payer: Self-pay | Admitting: Internal Medicine

## 2015-09-16 VITALS — BP 130/82 | HR 76 | Wt 220.0 lb

## 2015-09-16 DIAGNOSIS — I2510 Atherosclerotic heart disease of native coronary artery without angina pectoris: Secondary | ICD-10-CM | POA: Diagnosis not present

## 2015-09-16 DIAGNOSIS — I2583 Coronary atherosclerosis due to lipid rich plaque: Secondary | ICD-10-CM

## 2015-09-16 DIAGNOSIS — E119 Type 2 diabetes mellitus without complications: Secondary | ICD-10-CM

## 2015-09-16 DIAGNOSIS — R197 Diarrhea, unspecified: Secondary | ICD-10-CM

## 2015-09-16 DIAGNOSIS — E669 Obesity, unspecified: Secondary | ICD-10-CM

## 2015-09-16 DIAGNOSIS — E1169 Type 2 diabetes mellitus with other specified complication: Secondary | ICD-10-CM

## 2015-09-16 MED ORDER — VALACYCLOVIR HCL 500 MG PO TABS: 500.0000 mg | ORAL_TABLET | Freq: Three times a day (TID) | ORAL | Status: DC

## 2015-09-16 MED ORDER — GLUCOSE BLOOD VI STRP: 1.0000 | ORAL_STRIP | Status: DC

## 2015-09-16 MED ORDER — ATORVASTATIN CALCIUM 80 MG PO TABS: 80.0000 mg | ORAL_TABLET | Freq: Every day | ORAL | Status: DC

## 2015-09-16 MED ORDER — INDOMETHACIN ER 75 MG PO CPCR: 75.0000 mg | ORAL_CAPSULE | Freq: Two times a day (BID) | ORAL | Status: DC

## 2015-09-16 MED ORDER — ONETOUCH ULTRASOFT LANCETS MISC: Status: DC

## 2015-09-16 MED ORDER — NITROGLYCERIN 0.4 MG SL SUBL: SUBLINGUAL_TABLET | SUBLINGUAL | Status: DC

## 2015-09-16 MED ORDER — METRONIDAZOLE 250 MG PO TABS: 250.0000 mg | ORAL_TABLET | Freq: Three times a day (TID) | ORAL | Status: DC

## 2015-09-16 MED ORDER — GLIPIZIDE ER 5 MG PO TB24: 5.0000 mg | ORAL_TABLET | Freq: Every day | ORAL | Status: DC

## 2015-09-16 MED ORDER — ATOVAQUONE-PROGUANIL HCL 250-100 MG PO TABS: 1.0000 | ORAL_TABLET | Freq: Every day | ORAL | Status: DC

## 2015-09-16 MED ORDER — CIPROFLOXACIN HCL 500 MG PO TABS: 500.0000 mg | ORAL_TABLET | Freq: Two times a day (BID) | ORAL | Status: DC

## 2015-09-16 MED ORDER — METFORMIN HCL 1000 MG PO TABS: ORAL_TABLET | ORAL | Status: DC

## 2015-09-16 MED ORDER — ENALAPRIL MALEATE 5 MG PO TABS: ORAL_TABLET | ORAL | Status: DC

## 2015-09-16 MED ORDER — BUPROPION HCL ER (XL) 150 MG PO TB24: ORAL_TABLET | ORAL | Status: DC

## 2015-09-16 NOTE — Telephone Encounter (Signed)
Patient reports that he did not get the prescription for metronidazole he was prescribed by Dr. Henrene Pastor in February.  He reports it was not at the pharmacy when he picked up his prep.  Patient was seen by primary care today and Dr. Christen Bame recommended he call us to discuss.  I advised the patient I sent a refill of this and he should be able to pick it up today.  He will keep the appt for a colonoscopy on 09/27/15

## 2015-09-16 NOTE — Assessment & Plan Note (Signed)
Worse. Discussed potential changes in Rx after GI w/up

## 2015-09-16 NOTE — Assessment & Plan Note (Addendum)
Colon pending Check w/Dr Henrene Pastor re: Flagyl May hold Lipitor, Metformin - one at the time

## 2015-09-16 NOTE — Assessment & Plan Note (Signed)
Lipitor, ASA 
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
## 2015-09-16 NOTE — Progress Notes (Signed)
Pre visit review using our clinic review tool, if applicable. No additional management support is needed unless otherwise documented below in the visit note. 

## 2015-09-22 ENCOUNTER — Telehealth: Payer: Self-pay | Admitting: Internal Medicine

## 2015-09-22 NOTE — Telephone Encounter (Signed)
Reviewed prep instructions and diet restrictions with patient's wife

## 2015-09-27 ENCOUNTER — Encounter: Payer: Self-pay | Admitting: Internal Medicine

## 2015-09-27 ENCOUNTER — Ambulatory Visit (AMBULATORY_SURGERY_CENTER): Payer: 59 | Admitting: Internal Medicine

## 2015-09-27 VITALS — BP 155/46 | HR 66 | Temp 97.5°F | Resp 11 | Ht 74.0 in | Wt 221.0 lb

## 2015-09-27 DIAGNOSIS — Z1211 Encounter for screening for malignant neoplasm of colon: Secondary | ICD-10-CM | POA: Diagnosis present

## 2015-09-27 DIAGNOSIS — D122 Benign neoplasm of ascending colon: Secondary | ICD-10-CM | POA: Diagnosis not present

## 2015-09-27 DIAGNOSIS — R197 Diarrhea, unspecified: Secondary | ICD-10-CM

## 2015-09-27 LAB — GLUCOSE, CAPILLARY
GLUCOSE-CAPILLARY: 123 mg/dL — AB (ref 65–99)
Glucose-Capillary: 166 mg/dL — ABNORMAL HIGH (ref 65–99)

## 2015-09-27 MED ORDER — SODIUM CHLORIDE 0.9 % IV SOLN: 500.0000 mL | INTRAVENOUS | Status: DC

## 2015-09-27 NOTE — Progress Notes (Signed)
Patient awakening,vss,report to rn 

## 2015-09-27 NOTE — Progress Notes (Signed)
Called to room to assist during endoscopic procedure.  Patient ID and intended procedure confirmed with present staff. Received instructions for my participation in the procedure from the performing physician.  

## 2015-09-27 NOTE — Op Note (Signed)
[facility] Patient Name: Nash Villescas Procedure Date: 09/27/2015 2:08 PM MRN: [id number] Endoscopist: Docia Chuck. Henrene Pastor , MD Age: 65 Referring MD:  Date of Birth: 09/06/1950 Gender: Male Procedure:                Colonoscopy Indications:              Screening for colorectal malignant neoplasm.                            Previous examination 2007 negative for neoplasia.                            Recent problem with change in bowel habits with                            tendency toward diarrhea. Problem improving                            significantly after empiric metronidazole and                            avoiding sugar substitutes Medicines:                Monitored Anesthesia Care Procedure:                Pre-Anesthesia Assessment:                           - Prior to the procedure, a History and Physical                            was performed, and patient medications and                            allergies were reviewed. The patient's tolerance of                            previous anesthesia was also reviewed. The risks                            and benefits of the procedure and the sedation                            options and risks were discussed with the patient.                            All questions were answered, and informed consent                            was obtained. Prior Anticoagulants: The patient has                            taken no previous anticoagulant or antiplatelet  agents. ASA Grade Assessment: II - A patient with                            mild systemic disease. After reviewing the risks                            and benefits, the patient was deemed in                            satisfactory condition to undergo the procedure.                           After obtaining informed consent, the colonoscope                            was passed under direct vision. Throughout the   procedure, the patient's blood pressure, pulse, and                            oxygen saturations were monitored continuously. The                            Model CF-HQ190L [id number]) scope was introduced                            through the anus and advanced to the the cecum,                            identified by appendiceal orifice and ileocecal                            valve. The colonoscopy was performed without                            difficulty. The patient tolerated the procedure                            well. The quality of the bowel preparation was                            good. The bowel preparation used was SUPREP. The                            ileocecal valve, appendiceal orifice, and rectum                            were photographed. Scope In: 2:20:23 PM Scope Out: 2:33:23 PM Scope Withdrawal Time: 0 hours 11 minutes 9 seconds  Total Procedure Duration: 0 hours 13 minutes 0 seconds  Findings:      The digital rectal exam revealed hemorrhoids.      A 5 mm polyp was found in the ascending colon. The polyp was removed       with a cold snare. Resection and retrieval were complete.      Multiple medium-mouthed diverticula  were found in the sigmoid colon.      The exam was otherwise without abnormality on direct and retroflexion       views.      Biopsies for histology were taken with a cold forceps from the entire       colon for evaluation of microscopic colitis. Complications:            No immediate complications. Estimated Blood Loss:     Estimated blood loss: none. Impression:               - One 5 mm polyp in the ascending colon, removed                            with a cold snare. Resected and retrieved.                           - Diverticulosis in the sigmoid colon.                           - The examination was otherwise normal on direct                            and retroflexion views.                           - Biopsies were taken with a  cold forceps from the                            entire colon for evaluation of microscopic colitis. Recommendation:           - Patient has a contact number available for                            emergencies. The signs and symptoms of potential                            delayed complications were discussed with the                            patient. Return to normal activities tomorrow.                            Written discharge instructions were provided to the                            patient.                           - Resume previous diet.                           - Continue present medications.                           - Await pathology results.                           -  Repeat colonoscopy is recommended. The                            colonoscopy date will be determined after pathology                            results from today's exam become available for                            review. Procedure Code(s):        --- Professional ---                           [phone], Colonoscopy, flexible; with removal of                            tumor(s), polyp(s), or other lesion(s) by snare                            technique                           [id number], 60, Colonoscopy, flexible; with biopsy,                            single or multiple CPT copyright 2016 [employer]. All rights reserved. Docia Chuck. Henrene Pastor, MD 09/27/2015 2:47:04 PM This report has been signed electronically. Number of Addenda: 0 Referring MD:      Alexis Goodell. Kennis Carina, MD

## 2015-09-27 NOTE — Patient Instructions (Signed)

## 2015-09-28 ENCOUNTER — Telehealth: Payer: Self-pay | Admitting: Internal Medicine

## 2015-09-28 ENCOUNTER — Telehealth: Payer: Self-pay

## 2015-09-28 NOTE — Telephone Encounter (Signed)
Pts wife wanted to let us know that they really liked the list of high fiber foods that they were given after pts colon yesterday. She was concerned about the amount of sodium that is listed as ok-[id number] . Conservation officer, nature she believes is 2400-2500mg  of sodium. Wanted to let us know since paper info was from 2008, thought we might want to adjust this. Viann Shove RN notified.

## 2015-09-28 NOTE — Telephone Encounter (Signed)
  Follow up Call-  Call back number 09/27/2015  Post procedure Call Back phone  # [phone]  Permission to leave phone message Yes     Patient questions:  Do you have a fever, pain , or abdominal swelling? No. Pain Score  0 *  Have you tolerated food without any problems? Yes.    Have you been able to return to your normal activities? Yes.    Do you have any questions about your discharge instructions: Diet   No. Medications  No. Follow up visit  No.  Do you have questions or concerns about your Care? No.  Actions: * If pain score is 4 or above: No action needed, pain <4.  No problems per the pt. maw

## 2015-10-03 ENCOUNTER — Encounter: Payer: Self-pay | Admitting: Internal Medicine

## 2015-10-12 ENCOUNTER — Other Ambulatory Visit: Payer: Self-pay | Admitting: Internal Medicine

## 2015-10-17 ENCOUNTER — Telehealth: Payer: Self-pay | Admitting: Internal Medicine

## 2015-10-17 NOTE — Telephone Encounter (Signed)
Pts wife states he is going out of the country and Dr. Henrene Pastor had told him it would be a good idea to take Flagyl with him in case. Pt calling for a refill. Please advise if ok to refill.

## 2015-10-17 NOTE — Telephone Encounter (Signed)
Routing to [employer]

## 2015-10-18 MED ORDER — METRONIDAZOLE 250 MG PO TABS: 250.0000 mg | ORAL_TABLET | Freq: Three times a day (TID) | ORAL | Status: DC

## 2015-10-18 NOTE — Telephone Encounter (Signed)
Refill sent to pharmacy.   

## 2015-10-18 NOTE — Telephone Encounter (Signed)
Okay to refill.  Thanks.

## 2015-10-18 NOTE — Addendum Note (Signed)
Addended by: Rosanne Sack R on: 10/18/2015 10:42 AM   Modules accepted: Orders

## 2015-11-08 ENCOUNTER — Other Ambulatory Visit: Payer: Self-pay | Admitting: Internal Medicine

## 2015-12-07 ENCOUNTER — Other Ambulatory Visit: Payer: Self-pay | Admitting: Internal Medicine

## 2015-12-08 NOTE — Telephone Encounter (Signed)
Please advise, thanks.

## 2015-12-30 ENCOUNTER — Other Ambulatory Visit (INDEPENDENT_AMBULATORY_CARE_PROVIDER_SITE_OTHER): Payer: 59

## 2015-12-30 DIAGNOSIS — E119 Type 2 diabetes mellitus without complications: Secondary | ICD-10-CM | POA: Diagnosis not present

## 2015-12-30 LAB — BASIC METABOLIC PANEL
BUN: 17 mg/dL (ref 6–23)
CALCIUM: 9.7 mg/dL (ref 8.4–10.5)
CHLORIDE: 103 meq/L (ref 96–112)
CO2: 27 meq/L (ref 19–32)
Creatinine, Ser: 1.28 mg/dL (ref 0.40–1.50)
GFR: 59.91 mL/min — ABNORMAL LOW (ref >60.00)
Glucose, Bld: 197 mg/dL — ABNORMAL HIGH (ref 70–99)
Potassium: 4.5 mEq/L (ref 3.5–5.1)
SODIUM: 139 meq/L (ref 135–145)

## 2015-12-30 LAB — HEMOGLOBIN A1C: HEMOGLOBIN A1C: 7.1 % — AB (ref 4.6–6.5)

## 2016-01-06 ENCOUNTER — Ambulatory Visit: Payer: 59 | Admitting: Internal Medicine

## 2016-01-09 ENCOUNTER — Ambulatory Visit (INDEPENDENT_AMBULATORY_CARE_PROVIDER_SITE_OTHER): Payer: 59 | Admitting: Internal Medicine

## 2016-01-09 ENCOUNTER — Encounter: Payer: Self-pay | Admitting: Internal Medicine

## 2016-01-09 VITALS — BP 132/70 | HR 74 | Wt 220.0 lb

## 2016-01-09 DIAGNOSIS — E669 Obesity, unspecified: Secondary | ICD-10-CM

## 2016-01-09 DIAGNOSIS — I2510 Atherosclerotic heart disease of native coronary artery without angina pectoris: Secondary | ICD-10-CM

## 2016-01-09 DIAGNOSIS — E119 Type 2 diabetes mellitus without complications: Secondary | ICD-10-CM

## 2016-01-09 DIAGNOSIS — E1159 Type 2 diabetes mellitus with other circulatory complications: Secondary | ICD-10-CM | POA: Diagnosis not present

## 2016-01-09 DIAGNOSIS — R197 Diarrhea, unspecified: Secondary | ICD-10-CM

## 2016-01-09 DIAGNOSIS — I10 Essential (primary) hypertension: Secondary | ICD-10-CM

## 2016-01-09 DIAGNOSIS — E1169 Type 2 diabetes mellitus with other specified complication: Secondary | ICD-10-CM

## 2016-01-09 NOTE — Assessment & Plan Note (Signed)
Cont w/Glipizide, Metformin

## 2016-01-09 NOTE — Assessment & Plan Note (Signed)
Appt w/Dr Marlou Porch is pending Lipitor, ASA NTG prn

## 2016-01-09 NOTE — Assessment & Plan Note (Signed)
Dr Marlou Porch Enalapril

## 2016-01-09 NOTE — Assessment & Plan Note (Signed)
S/p colonoscopy abx helped - much better

## 2016-01-09 NOTE — Progress Notes (Signed)
Pre visit review using our clinic review tool, if applicable. No additional management support is needed unless otherwise documented below in the visit note. 
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
## 2016-01-30 ENCOUNTER — Ambulatory Visit (INDEPENDENT_AMBULATORY_CARE_PROVIDER_SITE_OTHER): Payer: 59 | Admitting: Cardiology

## 2016-01-30 ENCOUNTER — Encounter: Payer: Self-pay | Admitting: Cardiology

## 2016-01-30 VITALS — BP 128/72 | HR 81 | Ht 72.0 in | Wt 218.8 lb

## 2016-01-30 DIAGNOSIS — I10 Essential (primary) hypertension: Secondary | ICD-10-CM

## 2016-01-30 DIAGNOSIS — E785 Hyperlipidemia, unspecified: Secondary | ICD-10-CM

## 2016-01-30 DIAGNOSIS — I25810 Atherosclerosis of coronary artery bypass graft(s) without angina pectoris: Secondary | ICD-10-CM

## 2016-01-30 DIAGNOSIS — Z951 Presence of aortocoronary bypass graft: Secondary | ICD-10-CM

## 2016-01-30 MED ORDER — NIACIN 100 MG PO TABS: 100.0000 mg | ORAL_TABLET | Freq: Every day | ORAL | Status: DC

## 2016-01-30 MED ORDER — ASPIRIN 81 MG PO TABS: 81.0000 mg | ORAL_TABLET | Freq: Every day | ORAL | Status: DC

## 2016-01-30 NOTE — Patient Instructions (Signed)
Medication Instructions:  Please decrease ASA to 81 mg a day. Continue all other medications as listed.  Testing/Procedures: Your physician has requested that you have a myoview. For further information please visit [email or web address]. Please follow instruction sheet, as given.  Follow-Up: Follow up in 1 year with Dr. Marlou Porch.  You will receive a letter in the mail 2 months before you are due.  Please call us when you receive this letter to schedule your follow up appointment.  If you need a refill on your cardiac medications before your next appointment, please call your pharmacy.  Thank you for choosing [facility]!!

## 2016-01-30 NOTE — Progress Notes (Signed)
Cardiology Office Note    Date:  01/30/2016   ID:  [name] Sachs [place] [name], DOB 1951-02-01, MRN [id number]  PCP:  [name] Kehr, MD  Cardiologist:   Candee Furbish, MD     History of Present Illness:  [name] Tennyson [place] [name] is a 65 y.o. male with coronary artery disease, DM, HTN status post CABG in 2009 with normal LV function and normal nuclear stress test in 2011 with no ischemia. No issues with medications. No chest pain, no anginal symptoms.  Last week at June 2017, going to bathroom at night, get back into bed and cathcing his breath. Also felt from upper arm pain constriction as well. Happened 2 times. Bending over, dyspnea. Chest pain rare off and on. Has not taken any Nitrostat.  Prior to CABG had gall bladder attack and had NUC stress test and was abnormal, then Cath, then CABG. Silent MI. Thinks it may have occurred after post hole digging. Felt short winded.   Wonders if some of the symptoms could be secondary to increased abdominal weight. He also has snoring, rare chest pressure, fatigue, sweating, cough.  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   Past Medical History:  Diagnosis Date  . Aortic root dilatation (HCC)    slight, echo 2009  . Blood transfusion without reported diagnosis   . CAD (coronary artery disease)    Nuclear, Jul 04, 2009 NO ischemia  . Diabetes mellitus   . Diverticulosis   . Herpes simplex   . Hx of CABG 04/2008   October, 2009  . Hyperlipidemia   . Low testosterone 2010   LOW DHEA  . Microalbuminuria   . Snoring    prior history of surgery for sleep apnea    Past Surgical History:  Procedure Laterality Date  . APPENDECTOMY  1978  . CHOLECYSTECTOMY  2009  . CORONARY ARTERY BYPASS GRAFT  2009  . Removal of [place]     ENT  . TONSILECTOMY, ADENOIDECTOMY, BILATERAL MYRINGOTOMY AND TUBES      Current Medications: Outpatient Medications Prior to Visit  Medication Sig Dispense Refill  . alprostadil  (EDEX) 10 MCG injection 10 mcg by Intracavitary route as needed for erectile dysfunction. use no more than 3 times per week 1 each 5  . atorvastatin (LIPITOR) 80 MG tablet Take 1 tablet (80 mg total) by mouth daily. 90 tablet 3  . atovaquone-proguanil (MALARONE) 250-100 MG TABS tablet TAKE 1 TABLET BY MOUTH EVERY DAY OR TWICE A DAY USE DURING AND 7 DAYS AFTER TRIP 30 tablet 0  . buPROPion (WELLBUTRIN XL) 150 MG 24 hr tablet TAKE 1 TABLET BY MOUTH EVERY DAY 90 tablet 3  . Cholecalciferol 1000 UNITS tablet Take 1 tablet (1,000 Units total) by mouth daily. 200 tablet 3  . ciprofloxacin (CIPRO) 500 MG tablet TAKE 1 TABLET BY MOUTH TWICE A DAY 20 tablet 1  . Diclofenac Sodium (PENNSAID) 2 % SOLN Apply twice daily to affected area 1 Bottle 2  . diphenhydrAMINE (BENADRYL) 25 MG tablet Take 1-2 tablets (25-50 mg total) by mouth every 8 (eight) hours as needed for allergies. 60 tablet 5  . enalapril (VASOTEC) 5 MG tablet TAKE 2 TABLETS BY MOUTH EVERY DAY 180 tablet 3  . glipiZIDE (GLUCOTROL XL) 5 MG 24 hr tablet Take 1 tablet (5 mg total) by mouth daily. 90 tablet 3  . glucose blood (ONE TOUCH ULTRA TEST) test strip Test sugar up to five times a day Dx. E11.9 150 each 4  .  ibuprofen (ADVIL,MOTRIN) 600 MG tablet TAKE 1 TABLET BY MOUTH EVERY 4 HOURS AS NEEDED FOR PAIN 30 tablet 0  . indomethacin (INDOCIN SR) 75 MG CR capsule Take 1 capsule (75 mg total) by mouth 2 (two) times daily with a meal. 30 capsule 1  . Lancets (ONETOUCH ULTRASOFT) lancets USE TO CHECK BLOOD SUGARS UP TO 4 TIMES A DAY 200 each 3  . metFORMIN (GLUCOPHAGE) 1000 MG tablet TAKE 1 TABLET (1,000 MG TOTAL) BY MOUTH 2 (TWO) TIMES DAILY WITH A MEAL. FOR DIABETES 180 tablet 3  . nitroGLYCERIN (NITROSTAT) 0.4 MG SL tablet 1 TABLET UNDER TONGUE AT ONSET OF CHEST PAIN YOU MAY REPEAT EVERY 5 MINUTES FOR UP TO 3 DOSES. 50 tablet 3  . Nutritional Supplements (DHEA PO) Take by mouth.    . OMEGA 3-6-9 FATTY ACIDS PO Take 1 tablet by mouth daily.     [name] DELICA LANCETS [id number] MISC CHECK BLOOD SUGARS 4 TIMES A DAY 200 each 3  . promethazine (PHENERGAN) 25 MG tablet Take 1 tablet (25 mg total) by mouth 4 (four) times daily as needed for nausea. 60 tablet 11  . silodosin (RAPAFLO) 8 MG CAPS capsule Take 1 capsule (8 mg total) by mouth daily. 90 capsule 3  . tadalafil (CIALIS) 20 MG tablet Take 1 tablet (20 mg total) by mouth every three (3) days as needed for erectile dysfunction. 10 tablet 6  . testosterone cypionate (DEPO-TESTOSTERONE) 100 MG/ML injection Inject 1 mL (100 mg total) into the muscle every 7 (seven) days. For IM use only 10 mL 3  . valACYclovir (VALTREX) 500 MG tablet Take 1 tablet (500 mg total) by mouth 3 (three) times daily. 21 tablet 2  . aspirin 325 MG tablet Take 325 mg by mouth daily.    . metroNIDAZOLE (FLAGYL) 250 MG tablet Take 1 tablet (250 mg total) by mouth 3 (three) times daily. 30 tablet 0  . niacin (NIASPAN) 1000 MG CR tablet Take 1,000 mg by mouth at bedtime.    Marland Kitchen esomeprazole (NEXIUM) 40 MG capsule Take 1 capsule (40 mg total) by mouth 2 (two) times daily. 180 capsule 3   No facility-administered medications prior to visit.      Allergies:   Iohexol and Sulfadiazine   Social History   Social History  . Marital status: Married    Spouse name: N/A  . Number of children: N/A  . Years of education: N/A   Social History Main Topics  . Smoking status: Never Smoker  . Smokeless tobacco: Never Used  . Alcohol use No  . Drug use: No  . Sexual activity: Yes   Other Topics Concern  . None   Social History Narrative   Travels a lot   Regular exercise - NO     Family History:  The patient's family history includes Diabetes in his father and mother; Heart disease in his father; Mental illness (age of onset: 55) in his mother.   ROS:   Please see the history of present illness.    ROS All other systems reviewed and are negative.   PHYSICAL EXAM:   VS:  BP 128/72   Pulse 81   Ht 6' (1.829 m)   Wt  218 lb 12.8 oz (99.2 kg)   BMI 29.67 kg/m    GEN: Well nourished, well developed, in no acute distress  HEENT: normal  Neck: no JVD, carotid bruits, or masses Cardiac: RRR; no murmurs, rubs, or gallops,no edema Bypass scar noted Respiratory:  clear to auscultation bilaterally, normal work of breathing GI: soft, nontender, nondistended, + BS MS: no deformity or atrophy  Skin: warm and dry, no rash Neuro:  Alert and Oriented x 3, Strength and sensation are intact Psych: euthymic mood, full affect  Wt Readings from Last 3 Encounters:  01/30/16 218 lb 12.8 oz (99.2 kg)  01/09/16 220 lb (99.8 kg)  09/27/15 221 lb (100.2 kg)      Studies/Labs Reviewed:   EKG:  EKG is ordered today.  The ekg ordered today demonstrates 01/30/16-sinus rhythm, old inferior infarct pattern, heart rate 81 bpm, PR interval 208 ms. No ST segment changes.  Recent Labs: 12/30/2015: BUN 17; Creatinine, Ser 1.28; Potassium 4.5; Sodium 139   Lipid Panel    Component Value Date/Time   CHOL 144 05/24/2014 0734   TRIG 231.0 (H) 05/24/2014 0734   TRIG 270 (HH) 07/09/2006 1702   HDL 30.20 (L) 05/24/2014 0734   CHOLHDL 5 05/24/2014 0734   VLDL 46.2 (H) 05/24/2014 0734   LDLCALC 68 02/02/2014 0734   LDLDIRECT 77.2 05/24/2014 0734    Additional studies/ records that were reviewed today include:  Prior office notes, EKG, lab work reviewed    ASSESSMENT:    1. History of coronary artery bypass graft   2. Essential hypertension   3. Dyslipidemia   4. Coronary artery disease involving coronary bypass graft of native heart without angina pectoris      PLAN:  In order of problems listed above:  CAD/CABG 2009  - He is having some possible anginal symptoms. Transient, 2 times where his left arm had a squeezing-like sensation as though a blood pressure cuff was filling up at night. Sometimes having dyspnea as well. Is been several years since we have checked a stress test. We will go ahead and order a nuclear  stress test.  Diabetes with associated hypertension  - Working with Dr. Alain Marion.  - Stable. Medications reviewed.  Hyperlipidemia  - Continuing with atorvastatin. No side effects. Also taking over-the-counter niacin.  Overweight  - Mostly in the abdominal region. Decrease carbohydrates.   Medication Adjustments/Labs and Tests Ordered: Current medicines are reviewed at length with the patient today.  Concerns regarding medicines are outlined above.  Medication changes, Labs and Tests ordered today are listed in the Patient Instructions below. Patient Instructions  Medication Instructions:  Please decrease ASA to 81 mg a day. Continue all other medications as listed.  Testing/Procedures: Your physician has requested that you have a myoview. For further information please visit [email or web address]. Please follow instruction sheet, as given.  Follow-Up: Follow up in 1 year with Dr. Marlou Porch.  You will receive a letter in the mail 2 months before you are due.  Please call us when you receive this letter to schedule your follow up appointment.  If you need a refill on your cardiac medications before your next appointment, please call your pharmacy.  Thank you for choosing [facility]!!         Signed, Candee Furbish, MD  01/30/2016 1:23 PM    [facility] [place], [place], [place]  [address] Phone: [phone]; Fax: [phone]

## 2016-02-01 ENCOUNTER — Telehealth (HOSPITAL_COMMUNITY): Payer: Self-pay | Admitting: *Deleted

## 2016-02-01 NOTE — Telephone Encounter (Signed)
Patient given detailed instructions per Myocardial Perfusion Study Information Sheet for the test on 02/07/16 at 7:30. Patient notified to arrive 15 minutes early and that it is imperative to arrive on time for appointment to keep from having the test rescheduled.  If you need to cancel or reschedule your appointment, please call the office within 24 hours of your appointment. Failure to do so may result in a cancellation of your appointment, and a $50 no show fee. Patient verbalized understanding.[name] [name]

## 2016-02-01 NOTE — Telephone Encounter (Signed)
Left message on voicemail in reference to upcoming appointment scheduled for 02/07/16. Phone number given for a call back so details instructions can be given. Lataunya Ruud W   

## 2016-02-07 ENCOUNTER — Telehealth: Payer: Self-pay | Admitting: Cardiology

## 2016-02-07 ENCOUNTER — Ambulatory Visit (HOSPITAL_COMMUNITY): Payer: 59 | Attending: Cardiology

## 2016-02-07 DIAGNOSIS — R0602 Shortness of breath: Secondary | ICD-10-CM | POA: Insufficient documentation

## 2016-02-07 DIAGNOSIS — I10 Essential (primary) hypertension: Secondary | ICD-10-CM | POA: Insufficient documentation

## 2016-02-07 DIAGNOSIS — Z951 Presence of aortocoronary bypass graft: Secondary | ICD-10-CM | POA: Insufficient documentation

## 2016-02-07 DIAGNOSIS — E119 Type 2 diabetes mellitus without complications: Secondary | ICD-10-CM | POA: Insufficient documentation

## 2016-02-07 DIAGNOSIS — I2510 Atherosclerotic heart disease of native coronary artery without angina pectoris: Secondary | ICD-10-CM | POA: Insufficient documentation

## 2016-02-07 DIAGNOSIS — Z8249 Family history of ischemic heart disease and other diseases of the circulatory system: Secondary | ICD-10-CM | POA: Diagnosis not present

## 2016-02-07 LAB — MYOCARDIAL PERFUSION IMAGING
CHL CUP NUCLEAR SRS: 2
CHL CUP NUCLEAR SSS: 4
LV dias vol: 106 mL (ref 62–150)
LV sys vol: 44 mL
NUC STRESS TID: 1.02
Peak HR: 82 {beats}/min
RATE: 0.31
Rest HR: 68 {beats}/min
SDS: 2

## 2016-02-07 MED ORDER — TECHNETIUM TC 99M TETROFOSMIN IV KIT
32.7000 | PACK | Freq: Once | INTRAVENOUS | Status: AC | PRN
  Administered 2016-02-07: 32.7 via INTRAVENOUS
  Filled 2016-02-07: qty 33

## 2016-02-07 MED ORDER — REGADENOSON 0.4 MG/5ML IV SOLN
0.4000 mg | Freq: Once | INTRAVENOUS | Status: AC
  Administered 2016-02-07: 0.4 mg via INTRAVENOUS

## 2016-02-07 MED ORDER — TECHNETIUM TC 99M TETROFOSMIN IV KIT
10.8000 | PACK | Freq: Once | INTRAVENOUS | Status: AC | PRN
  Administered 2016-02-07: 11 via INTRAVENOUS
  Filled 2016-02-07: qty 11

## 2016-02-07 NOTE — Telephone Encounter (Signed)
[name] is calling because they wanted to speak about the medications that [name] [name] also is there any symptoms  in which they need to look out for after the nuclear stress test today . Please call   Thanks

## 2016-02-07 NOTE — Telephone Encounter (Signed)
Wife calling stating he had stress test this AM and did not have their medications with them. She wanted to clarify that he is taking Niaspan 500 mg ER daily not OTC niacin 100 mg.  Will change in chart.

## 2016-02-10 ENCOUNTER — Telehealth: Payer: Self-pay | Admitting: Cardiology

## 2016-02-10 NOTE — Telephone Encounter (Signed)
Reviewed results with both patient and wife who states understanding.

## 2016-02-10 NOTE — Telephone Encounter (Signed)
Pt's wife called they would like the results of the strees test told to her please.  Pt would also like the results mailed to them?  She would like to know if there is any follow up needed.

## 2016-02-10 NOTE — Telephone Encounter (Signed)
Attempted to call to review stress testing results.  Mailbox is full. Pt is to f/u in 1 year per Dr Marlou Porch' note.

## 2016-03-06 ENCOUNTER — Other Ambulatory Visit: Payer: Self-pay | Admitting: Internal Medicine

## 2016-03-22 ENCOUNTER — Encounter: Payer: Self-pay | Admitting: Internal Medicine

## 2016-03-27 ENCOUNTER — Other Ambulatory Visit: Payer: Self-pay | Admitting: Internal Medicine

## 2016-04-01 ENCOUNTER — Other Ambulatory Visit: Payer: Self-pay | Admitting: Internal Medicine

## 2016-04-03 ENCOUNTER — Other Ambulatory Visit: Payer: Self-pay | Admitting: Internal Medicine

## 2016-04-03 NOTE — Telephone Encounter (Signed)
Ok to refill # 20 w/3 refills per PCP due to frequent traveling.

## 2016-04-06 ENCOUNTER — Other Ambulatory Visit (INDEPENDENT_AMBULATORY_CARE_PROVIDER_SITE_OTHER): Payer: 59

## 2016-04-06 DIAGNOSIS — E119 Type 2 diabetes mellitus without complications: Secondary | ICD-10-CM

## 2016-04-06 LAB — HEMOGLOBIN A1C: Hgb A1c MFr Bld: 7.1 % — ABNORMAL HIGH (ref 4.6–6.5)

## 2016-04-06 LAB — BASIC METABOLIC PANEL
BUN: 18 mg/dL (ref 6–23)
CO2: 29 mEq/L (ref 19–32)
Calcium: 9.7 mg/dL (ref 8.4–10.5)
Chloride: 104 mEq/L (ref 96–112)
Creatinine, Ser: 1.25 mg/dL (ref 0.40–1.50)
GFR: 61.53 mL/min (ref >60.00)
GLUCOSE: 178 mg/dL — AB (ref 70–99)
POTASSIUM: 4.5 meq/L (ref 3.5–5.1)
Sodium: 141 mEq/L (ref 135–145)

## 2016-05-02 ENCOUNTER — Ambulatory Visit (INDEPENDENT_AMBULATORY_CARE_PROVIDER_SITE_OTHER): Payer: 59 | Admitting: Internal Medicine

## 2016-05-02 ENCOUNTER — Other Ambulatory Visit: Payer: Self-pay | Admitting: Internal Medicine

## 2016-05-02 ENCOUNTER — Encounter: Payer: Self-pay | Admitting: Internal Medicine

## 2016-05-02 DIAGNOSIS — E1169 Type 2 diabetes mellitus with other specified complication: Secondary | ICD-10-CM

## 2016-05-02 DIAGNOSIS — E1159 Type 2 diabetes mellitus with other circulatory complications: Secondary | ICD-10-CM

## 2016-05-02 DIAGNOSIS — I10 Essential (primary) hypertension: Secondary | ICD-10-CM

## 2016-05-02 DIAGNOSIS — I152 Hypertension secondary to endocrine disorders: Secondary | ICD-10-CM

## 2016-05-02 DIAGNOSIS — E663 Overweight: Secondary | ICD-10-CM

## 2016-05-02 DIAGNOSIS — E669 Obesity, unspecified: Secondary | ICD-10-CM

## 2016-05-02 DIAGNOSIS — I2510 Atherosclerotic heart disease of native coronary artery without angina pectoris: Secondary | ICD-10-CM

## 2016-05-02 MED ORDER — GLIPIZIDE ER 5 MG PO TB24: ORAL_TABLET | ORAL | 3 refills | Status: DC

## 2016-05-02 NOTE — Assessment & Plan Note (Addendum)
Glipizide- will increase the dose, Metformin

## 2016-05-02 NOTE — Progress Notes (Signed)
Pre visit review using our clinic review tool, if applicable. No additional management support is needed unless otherwise documented below in the visit note. 

## 2016-05-02 NOTE — Assessment & Plan Note (Signed)
Enalapril 

## 2016-05-02 NOTE — Assessment & Plan Note (Signed)
Wt Readings from Last 3 Encounters:  05/02/16 223 lb (101.2 kg)  01/30/16 218 lb 12.8 oz (99.2 kg)  01/09/16 220 lb (99.8 kg)
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
## 2016-05-02 NOTE — Assessment & Plan Note (Signed)
Lipitor and ASA °

## 2016-05-30 ENCOUNTER — Other Ambulatory Visit: Payer: Self-pay | Admitting: Internal Medicine

## 2016-05-30 NOTE — Telephone Encounter (Signed)
Dr Henrene Pastor- Would you like me to refill metronidazole for patient? Looks like you have several times in the past as [place].Marland KitchenMarland KitchenMarland Kitchen

## 2016-05-31 NOTE — Telephone Encounter (Signed)
Okay to refill thank you.

## 2016-07-30 ENCOUNTER — Other Ambulatory Visit (INDEPENDENT_AMBULATORY_CARE_PROVIDER_SITE_OTHER): Payer: 59

## 2016-07-30 DIAGNOSIS — E119 Type 2 diabetes mellitus without complications: Secondary | ICD-10-CM | POA: Diagnosis not present

## 2016-07-30 LAB — BASIC METABOLIC PANEL
BUN: 18 mg/dL (ref 6–23)
CALCIUM: 9.6 mg/dL (ref 8.4–10.5)
CO2: 26 mEq/L (ref 19–32)
CREATININE: 1.23 mg/dL (ref 0.40–1.50)
Chloride: 104 mEq/L (ref 96–112)
GFR: 62.62 mL/min (ref >60.00)
GLUCOSE: 175 mg/dL — AB (ref 70–99)
Potassium: 4.4 mEq/L (ref 3.5–5.1)
SODIUM: 141 meq/L (ref 135–145)

## 2016-07-30 LAB — HEMOGLOBIN A1C: Hgb A1c MFr Bld: 7.2 % — ABNORMAL HIGH (ref 4.6–6.5)

## 2016-08-03 ENCOUNTER — Ambulatory Visit (INDEPENDENT_AMBULATORY_CARE_PROVIDER_SITE_OTHER): Payer: 59 | Admitting: Internal Medicine

## 2016-08-03 ENCOUNTER — Encounter: Payer: Self-pay | Admitting: Internal Medicine

## 2016-08-03 VITALS — BP 140/78 | HR 76 | Temp 98.5°F | Resp 16 | Ht 73.0 in | Wt 221.0 lb

## 2016-08-03 DIAGNOSIS — E669 Obesity, unspecified: Secondary | ICD-10-CM

## 2016-08-03 DIAGNOSIS — I2510 Atherosclerotic heart disease of native coronary artery without angina pectoris: Secondary | ICD-10-CM

## 2016-08-03 DIAGNOSIS — Z23 Encounter for immunization: Secondary | ICD-10-CM

## 2016-08-03 DIAGNOSIS — E663 Overweight: Secondary | ICD-10-CM

## 2016-08-03 DIAGNOSIS — E1159 Type 2 diabetes mellitus with other circulatory complications: Secondary | ICD-10-CM | POA: Diagnosis not present

## 2016-08-03 DIAGNOSIS — I10 Essential (primary) hypertension: Secondary | ICD-10-CM

## 2016-08-03 DIAGNOSIS — E1169 Type 2 diabetes mellitus with other specified complication: Secondary | ICD-10-CM

## 2016-08-03 DIAGNOSIS — I152 Hypertension secondary to endocrine disorders: Secondary | ICD-10-CM

## 2016-08-03 MED ORDER — GLIPIZIDE ER 5 MG PO TB24: ORAL_TABLET | ORAL | 3 refills | Status: DC

## 2016-08-03 MED ORDER — SILODOSIN 8 MG PO CAPS: 8.0000 mg | ORAL_CAPSULE | Freq: Every day | ORAL | 3 refills | Status: DC

## 2016-08-03 MED ORDER — DAPAGLIFLOZIN PROPANEDIOL 5 MG PO TABS: 5.0000 mg | ORAL_TABLET | Freq: Every day | ORAL | 3 refills | Status: DC

## 2016-08-03 MED ORDER — BUPROPION HCL ER (XL) 150 MG PO TB24: ORAL_TABLET | ORAL | 3 refills | Status: DC

## 2016-08-03 MED ORDER — METFORMIN HCL 1000 MG PO TABS: ORAL_TABLET | ORAL | 3 refills | Status: DC

## 2016-08-03 MED ORDER — ONETOUCH ULTRASOFT LANCETS MISC: 3 refills | Status: DC

## 2016-08-03 MED ORDER — VALACYCLOVIR HCL 500 MG PO TABS: 500.0000 mg | ORAL_TABLET | Freq: Three times a day (TID) | ORAL | 2 refills | Status: DC

## 2016-08-03 MED ORDER — NITROGLYCERIN 0.4 MG SL SUBL: SUBLINGUAL_TABLET | SUBLINGUAL | 3 refills | Status: DC

## 2016-08-03 MED ORDER — ENALAPRIL MALEATE 5 MG PO TABS: ORAL_TABLET | ORAL | 3 refills | Status: DC

## 2016-08-03 NOTE — Progress Notes (Signed)
Pre visit review using our clinic review tool, if applicable. No additional management support is needed unless otherwise documented below in the visit note. 

## 2016-08-03 NOTE — Assessment & Plan Note (Signed)
Enalapril 

## 2016-08-03 NOTE — Assessment & Plan Note (Signed)
Wt Readings from Last 3 Encounters:  08/03/16 221 lb (100.2 kg)  05/02/16 223 lb (101.2 kg)  01/30/16 218 lb 12.8 oz (99.2 kg)

## 2016-08-03 NOTE — Assessment & Plan Note (Signed)
ASA Lipitor 
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
## 2016-08-03 NOTE — Assessment & Plan Note (Signed)
Start low dose farxiga Cont Glipizide, Metformin

## 2016-08-04 ENCOUNTER — Other Ambulatory Visit: Payer: Self-pay | Admitting: Internal Medicine

## 2016-08-11 ENCOUNTER — Other Ambulatory Visit: Payer: Self-pay | Admitting: Internal Medicine

## 2016-08-24 ENCOUNTER — Other Ambulatory Visit: Payer: Self-pay | Admitting: Internal Medicine

## 2016-08-30 ENCOUNTER — Other Ambulatory Visit: Payer: Self-pay | Admitting: General Practice

## 2016-08-30 MED ORDER — ONETOUCH DELICA LANCETS 33G MISC: 3 refills | Status: DC

## 2016-09-06 ENCOUNTER — Other Ambulatory Visit: Payer: Self-pay | Admitting: *Deleted

## 2016-09-06 MED ORDER — ONETOUCH ULTRASOFT LANCETS MISC: 1.0000 | Freq: Three times a day (TID) | 5 refills | Status: DC

## 2016-09-11 ENCOUNTER — Other Ambulatory Visit: Payer: Self-pay | Admitting: *Deleted

## 2016-09-11 MED ORDER — ONETOUCH DELICA LANCETS 33G MISC: 1 refills | Status: DC

## 2016-09-11 NOTE — Telephone Encounter (Signed)
Rec'd fax pt needing the one touch delica lancets instead of the ultrasoft. [facility]...[facility]

## 2016-09-21 ENCOUNTER — Other Ambulatory Visit: Payer: Self-pay | Admitting: Internal Medicine

## 2016-09-25 ENCOUNTER — Other Ambulatory Visit: Payer: Self-pay

## 2016-09-25 ENCOUNTER — Telehealth: Payer: Self-pay | Admitting: Internal Medicine

## 2016-09-25 MED ORDER — DAPAGLIFLOZIN PROPANEDIOL 5 MG PO TABS: 5.0000 mg | ORAL_TABLET | Freq: Every day | ORAL | 1 refills | Status: DC

## 2016-09-25 NOTE — Telephone Encounter (Signed)
Pt needs a refill on dapagliflozin propanediol (FARXIGA) 5 MG TABS tablet. There was an issue with the pharmacy and they were not able to pick it up. Can we resend this? Also, the prescription for glipiZIDE (GLUCOTROL XL) 5 MG was increased to 2 a day by Dr Camila Li. Can a new script be sent over for that so that he will have the correct amount? [place] [facility] [place]

## 2016-09-25 NOTE — Telephone Encounter (Signed)
One rx already done on 3/23, farxiga sent today

## 2016-09-26 ENCOUNTER — Telehealth: Payer: Self-pay

## 2016-09-26 MED ORDER — GLIPIZIDE ER 5 MG PO TB24: ORAL_TABLET | ORAL | 3 refills | Status: DC

## 2016-09-26 NOTE — Telephone Encounter (Signed)
Patient states you told him to take 2 pills daily (one in am and one in pm)---however, instructions on rx states 1 and 1/2 tablet daily---pill is not scored and too hard to cut in half----routing to dr plotnikov, are you ok with patient taking 2 pills daily---patient states he has better control with 2 pills daily----please advise, I will call patient back, also [facility] (90 day supply requested)

## 2016-09-26 NOTE — Telephone Encounter (Signed)
Is it Glipizide? Thx

## 2016-09-26 NOTE — Telephone Encounter (Signed)
Yes, sorry----it's glipizide

## 2016-09-26 NOTE — Telephone Encounter (Signed)
Ok Thx 

## 2016-09-26 NOTE — Addendum Note (Signed)
Addended by: Cassandria Anger on: 09/26/2016 09:37 PM   Modules accepted: Orders

## 2016-09-27 NOTE — Telephone Encounter (Signed)
Advised patient and patient's wife new rx sent to pharm with directions corrected----take twice daily

## 2016-09-28 ENCOUNTER — Other Ambulatory Visit: Payer: Self-pay | Admitting: Internal Medicine

## 2016-10-01 IMAGING — NM NM MISC PROCEDURE
6 series · 36 of 36 positions shown · non-contrast
Comparison: none

[Series 1: rest · 6.40mm/px · 6 of 64 frames shown]
[frame 6/64  full-range]
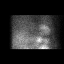
[frame 16/64  full-range]
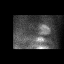
[frame 27/64  full-range]
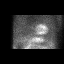
[frame 38/64  full-range]
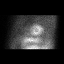
[frame 48/64  full-range]
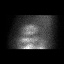
[frame 59/64  full-range]
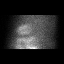

[Series 1: stress-sum-em · 6.40mm/px · 6 of 64 frames shown]
[frame 6/64]
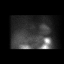
[frame 16/64]
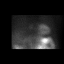
[frame 27/64]
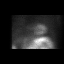
[frame 38/64]
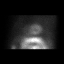
[frame 48/64]
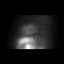
[frame 59/64]
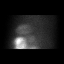

[Series 1: wbr_s-proj_st stress-sum-em · 6.40mm/px · 6 of 64 frames shown]
[frame 6/64]
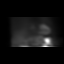
[frame 16/64]
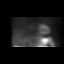
[frame 27/64]
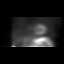
[frame 38/64]
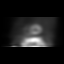
[frame 48/64]
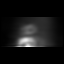
[frame 59/64]
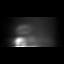

[Series 1: stress-gsp · 6.40mm/px · 6 of 512 frames shown]
[frame 43/512  full-range]
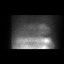
[frame 128/512  full-range]
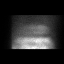
[frame 214/512  full-range]
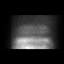
[frame 299/512  full-range]
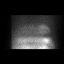
[frame 384/512  full-range]
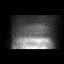
[frame 470/512  full-range]
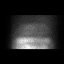

[Series 1: wbr_s-proj_st stress-gsp · 6.40mm/px · 6 of 512 frames shown]
[frame 43/512]
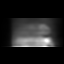
[frame 128/512]
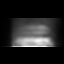
[frame 214/512]
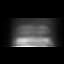
[frame 299/512]
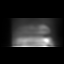
[frame 384/512]
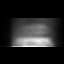
[frame 470/512]
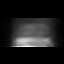

[Series 1: wbr_r-proj_st rest · 6.40mm/px · 6 of 64 frames shown]
[frame 6/64]
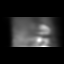
[frame 16/64]
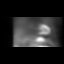
[frame 27/64]
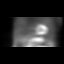
[frame 38/64]
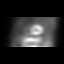
[frame 48/64]
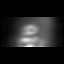
[frame 59/64]
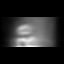

[36 of 36 positions shown; findings below may reference images not displayed]

Canned report from images found in remote index.

Refer to host system for actual result text.

## 2016-10-02 ENCOUNTER — Telehealth: Payer: Self-pay

## 2016-10-02 NOTE — Telephone Encounter (Signed)
Pharmacy called requesting refills on Cipro and [place]. Are these ok to fill? Please advise

## 2016-10-02 NOTE — Telephone Encounter (Signed)
OK Cipro ref x1 time OK Farxiga ref x1 year Thx

## 2016-10-03 MED ORDER — DAPAGLIFLOZIN PROPANEDIOL 5 MG PO TABS: 5.0000 mg | ORAL_TABLET | Freq: Every day | ORAL | 3 refills | Status: DC

## 2016-10-03 MED ORDER — CIPROFLOXACIN HCL 500 MG PO TABS: 500.0000 mg | ORAL_TABLET | Freq: Two times a day (BID) | ORAL | 3 refills | Status: DC

## 2016-10-03 NOTE — Telephone Encounter (Signed)
done

## 2016-10-03 NOTE — Addendum Note (Signed)
Addended by: Loren Racer B on: 10/03/2016 08:24 AM   Modules accepted: Orders

## 2016-10-29 ENCOUNTER — Other Ambulatory Visit (INDEPENDENT_AMBULATORY_CARE_PROVIDER_SITE_OTHER): Payer: 59

## 2016-10-29 DIAGNOSIS — E119 Type 2 diabetes mellitus without complications: Secondary | ICD-10-CM

## 2016-10-29 LAB — BASIC METABOLIC PANEL
BUN: 21 mg/dL (ref 6–23)
CALCIUM: 9.8 mg/dL (ref 8.4–10.5)
CO2: 27 meq/L (ref 19–32)
CREATININE: 1.26 mg/dL (ref 0.40–1.50)
Chloride: 107 mEq/L (ref 96–112)
GFR: 60.86 mL/min (ref >60.00)
Glucose, Bld: 125 mg/dL — ABNORMAL HIGH (ref 70–99)
Potassium: 4.1 mEq/L (ref 3.5–5.1)
SODIUM: 141 meq/L (ref 135–145)

## 2016-10-29 LAB — HEMOGLOBIN A1C: HEMOGLOBIN A1C: 6.9 % — AB (ref 4.6–6.5)

## 2016-11-02 ENCOUNTER — Encounter: Payer: Self-pay | Admitting: Internal Medicine

## 2016-11-02 ENCOUNTER — Ambulatory Visit (INDEPENDENT_AMBULATORY_CARE_PROVIDER_SITE_OTHER): Payer: 59 | Admitting: Internal Medicine

## 2016-11-02 DIAGNOSIS — M545 Low back pain, unspecified: Secondary | ICD-10-CM

## 2016-11-02 DIAGNOSIS — E1169 Type 2 diabetes mellitus with other specified complication: Secondary | ICD-10-CM | POA: Diagnosis not present

## 2016-11-02 DIAGNOSIS — E669 Obesity, unspecified: Secondary | ICD-10-CM | POA: Diagnosis not present

## 2016-11-02 DIAGNOSIS — E1159 Type 2 diabetes mellitus with other circulatory complications: Secondary | ICD-10-CM

## 2016-11-02 DIAGNOSIS — I10 Essential (primary) hypertension: Secondary | ICD-10-CM

## 2016-11-02 DIAGNOSIS — E119 Type 2 diabetes mellitus without complications: Secondary | ICD-10-CM

## 2016-11-02 DIAGNOSIS — I2510 Atherosclerotic heart disease of native coronary artery without angina pectoris: Secondary | ICD-10-CM

## 2016-11-02 DIAGNOSIS — I152 Hypertension secondary to endocrine disorders: Secondary | ICD-10-CM

## 2016-11-02 MED ORDER — TRAMADOL HCL 50 MG PO TABS: 50.0000 mg | ORAL_TABLET | Freq: Three times a day (TID) | ORAL | 1 refills | Status: DC | PRN

## 2016-11-02 NOTE — Assessment & Plan Note (Signed)
Glipizide, Metformin, Wilder Glade

## 2016-11-02 NOTE — Assessment & Plan Note (Signed)
Tramadol prn ROM exercise

## 2016-11-02 NOTE — Assessment & Plan Note (Signed)
Enalapril 

## 2016-11-02 NOTE — Progress Notes (Signed)
Pre visit review using our clinic review tool, if applicable. No additional management support is needed unless otherwise documented below in the visit note. 
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
## 2016-11-02 NOTE — Assessment & Plan Note (Signed)
Lipitor, ASA 

## 2016-12-06 ENCOUNTER — Other Ambulatory Visit: Payer: Self-pay | Admitting: Internal Medicine

## 2016-12-06 ENCOUNTER — Telehealth: Payer: Self-pay | Admitting: Internal Medicine

## 2016-12-06 MED ORDER — ONETOUCH DELICA LANCETS 33G MISC: 1 refills | Status: DC

## 2016-12-06 MED ORDER — DAPAGLIFLOZIN PROPANEDIOL 5 MG PO TABS: 5.0000 mg | ORAL_TABLET | Freq: Every day | ORAL | 1 refills | Status: DC

## 2016-12-06 NOTE — Telephone Encounter (Signed)
Wife left msg on triage requesting refills. Same msg below..Verified [facility].../[facility]

## 2016-12-06 NOTE — Telephone Encounter (Addendum)
Pt needs a refill of dapagliflozin propanediol (FARXIGA) 5 MG TABS tablet And a refill of his lancets.   Would like a 90 day supply   [facility] [address]

## 2016-12-20 ENCOUNTER — Telehealth: Payer: Self-pay | Admitting: Internal Medicine

## 2016-12-20 DIAGNOSIS — L989 Disorder of the skin and subcutaneous tissue, unspecified: Secondary | ICD-10-CM

## 2016-12-20 NOTE — Telephone Encounter (Signed)
Spouse called stating that Dr. Camila Li wanted patient to see Dr. Danella Sensing - Dermatology b/c patient has seen this dermatologist before.  Patient has appointment set up possibly for July 3rd.  Patient's spouse states office is wanting a referral from Dr. Alain Marion before this appt date.  Please enter referral to be worked.   I am going to copy Cecille Rubin on this for FYI to work as soon as Dr. Camila Li has entered into system.  Cecille Rubin, can you please call once referral is sent?

## 2016-12-21 NOTE — Telephone Encounter (Signed)
Put skin lesion on referral. Thank you

## 2016-12-21 NOTE — Telephone Encounter (Signed)
Referral entered  

## 2016-12-21 NOTE — Telephone Encounter (Signed)
Referral faxed to [facility] and mailed copy to pt

## 2016-12-21 NOTE — Telephone Encounter (Signed)
As soon as the referral is put in and faxed I will call wife

## 2016-12-21 NOTE — Telephone Encounter (Signed)
Pt wife called stating appt is 7/3 at 8:20-[place] [facility] and Dr is Dr. Danella Sensing    Wife Phone Number # [phone]

## 2017-01-01 DIAGNOSIS — L814 Other melanin hyperpigmentation: Secondary | ICD-10-CM | POA: Insufficient documentation

## 2017-01-15 ENCOUNTER — Encounter: Payer: Self-pay | Admitting: *Deleted

## 2017-01-29 ENCOUNTER — Encounter: Payer: Self-pay | Admitting: Cardiology

## 2017-01-29 ENCOUNTER — Ambulatory Visit (INDEPENDENT_AMBULATORY_CARE_PROVIDER_SITE_OTHER): Payer: 59 | Admitting: Cardiology

## 2017-01-29 VITALS — BP 122/78 | HR 71 | Ht 73.0 in | Wt 218.4 lb

## 2017-01-29 DIAGNOSIS — I25810 Atherosclerosis of coronary artery bypass graft(s) without angina pectoris: Secondary | ICD-10-CM

## 2017-01-29 DIAGNOSIS — E785 Hyperlipidemia, unspecified: Secondary | ICD-10-CM

## 2017-01-29 DIAGNOSIS — I10 Essential (primary) hypertension: Secondary | ICD-10-CM | POA: Diagnosis not present

## 2017-01-29 NOTE — Progress Notes (Signed)
Cardiology Office Note    Date:  01/29/2017   ID:  [name] Sachs Hillery [name], DOB May 09, 1951, MRN [id number]  PCP:  Cassandria Anger, MD  Cardiologist:   Candee Furbish, MD     History of Present Illness:  [name] Jacuinde Hehir [name] is a 66 y.o. male with coronary artery disease, DM, HTN status post CABG in 2009 with normal LV function and normal nuclear stress test in 2011 and 2017 with no ischemia. No issues with medications. June 2017, going to bathroom at night, get back into bed and Catching his breath. Bending over, dyspnea. Chest pain rare off and on. Has not taken any Nitrostat. Stress test at that time was reassuring.  Prior to CABG had gall bladder attack and had NUC stress test and was abnormal, then Cath, then CABG. Silent MI. Thinks it may have occurred after post hole digging. Felt short winded.   Overall he is doing quite well. Occasionally he will feel like he has just run when he is sitting down in his chair. He denies any palpitations during these incidents. No real chest pain. He definitely notices some shortness of breath when bending over.  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 He was going to retire soon but they have asked him to stay on and he is still traveling.  Past Medical History:  Diagnosis Date  . Aortic root dilatation (HCC)    slight, echo 2009  . Blood transfusion without reported diagnosis   . CAD (coronary artery disease)    Nuclear, Jul 04, 2009 NO ischemia  . Diabetes mellitus   . Diverticulosis   . Herpes simplex   . Hx of CABG 04/2008   October, 2009  . Hyperlipidemia   . Low testosterone 2010   LOW DHEA  . Microalbuminuria   . Snoring    prior history of surgery for sleep apnea    Past Surgical History:  Procedure Laterality Date  . APPENDECTOMY  1978  . CHOLECYSTECTOMY  2009  . CORONARY ARTERY BYPASS GRAFT  2009  . Removal of [place]     ENT  . TONSILECTOMY, ADENOIDECTOMY, BILATERAL MYRINGOTOMY  AND TUBES      Current Medications: Outpatient Medications Prior to Visit  Medication Sig Dispense Refill  . aspirin 81 MG tablet Take 1 tablet (81 mg total) by mouth daily.    Marland Kitchen atorvastatin (LIPITOR) 80 MG tablet TAKE 1 TABLET (80 MG TOTAL) BY MOUTH DAILY. 90 tablet 1  . atovaquone-proguanil (MALARONE) 250-100 MG TABS tablet TAKE 1 TABLET BY MOUTH EVERY DAY OR TWICE A DAY USE DURING AND 7 DAYS AFTER TRIP 30 tablet 2  . buPROPion (WELLBUTRIN XL) 150 MG 24 hr tablet TAKE 1 TABLET BY MOUTH EVERY DAY 90 tablet 2  . Cholecalciferol 1000 UNITS tablet Take 1 tablet (1,000 Units total) by mouth daily. 200 tablet 3  . ciprofloxacin (CIPRO) 500 MG tablet Take 1 tablet (500 mg total) by mouth 2 (two) times daily. 120 tablet 3  . dapagliflozin propanediol (FARXIGA) 5 MG TABS tablet Take 5 mg by mouth daily. 90 tablet 1  . Diclofenac Sodium (PENNSAID) 2 % SOLN Apply twice daily to affected area 1 Bottle 2  . diphenhydrAMINE (BENADRYL) 25 MG tablet Take 1-2 tablets (25-50 mg total) by mouth every 8 (eight) hours as needed for allergies. 60 tablet 5  . enalapril (VASOTEC) 5 MG tablet TAKE 2 TABLETS BY MOUTH EVERY DAY 180 tablet 3  . glipiZIDE (GLUCOTROL XL) 5 MG 24  hr tablet Take 2 tablets in am or one twice a day 180 tablet 3  . glucose blood (ONE TOUCH ULTRA TEST) test strip 1 each by Other route 5 (five) times daily. 150 each 5  . ibuprofen (ADVIL,MOTRIN) 600 MG tablet TAKE 1 TABLET BY MOUTH EVERY 4 HOURS AS NEEDED FOR PAIN 30 tablet 0  . indomethacin (INDOCIN SR) 75 MG CR capsule TAKE 1 CAPSULE (75 MG TOTAL) BY MOUTH 2 (TWO) TIMES DAILY WITH A MEAL. 30 capsule 3  . metFORMIN (GLUCOPHAGE) 1000 MG tablet TAKE 1 TABLET (1,000 MG TOTAL) BY MOUTH 2 (TWO) TIMES DAILY WITH A MEAL. FOR DIABETES 180 tablet 3  . metroNIDAZOLE (FLAGYL) 250 MG tablet TAKE 1 TABLET (250 MG TOTAL) BY MOUTH 3 (THREE) TIMES DAILY. 30 tablet 0  . niacin (NIASPAN) 500 MG CR tablet Take 1 tablet (500 mg total) by mouth at bedtime.    .  nitroGLYCERIN (NITROSTAT) 0.4 MG SL tablet 1 TABLET UNDER TONGUE AT ONSET OF CHEST PAIN YOU MAY REPEAT EVERY 5 MINUTES FOR UP TO 3 DOSES. 50 tablet 3  . OMEGA 3-6-9 FATTY ACIDS PO Take 1 tablet by mouth daily.     . ONE TOUCH ULTRA TEST test strip TEST SUGAR UP TO 5 TIMES DAILY 150 each 4  . ONETOUCH DELICA LANCETS 67E MISC Use to help check blood sugars four times a day Dx E11.9 360 each 1  . tadalafil (CIALIS) 20 MG tablet Take 1 tablet (20 mg total) by mouth every three (3) days as needed for erectile dysfunction. 10 tablet 6  . valACYclovir (VALTREX) 500 MG tablet Take 1 tablet (500 mg total) by mouth 3 (three) times daily. 21 tablet 2  . alprostadil (EDEX) 10 MCG injection 10 mcg by Intracavitary route as needed for erectile dysfunction. use no more than 3 times per week 1 each 5  . NITROSTAT 0.4 MG SL tablet PLACE 1 TABLET UNDER TONGUE AT ONSET OF CHEST PAIN YOU MAY REPEAT EVERY 5 MINUTES FOR UP TO 3 DOSES. 50 tablet 0  . Nutritional Supplements (DHEA PO) Take by mouth.    . promethazine (PHENERGAN) 25 MG tablet Take 1 tablet (25 mg total) by mouth 4 (four) times daily as needed for nausea. 60 tablet 11  . silodosin (RAPAFLO) 8 MG CAPS capsule Take 1 capsule (8 mg total) by mouth daily. 90 capsule 3  . testosterone cypionate (DEPO-TESTOSTERONE) 100 MG/ML injection Inject 1 mL (100 mg total) into the muscle every 7 (seven) days. For IM use only 10 mL 3  . traMADol (ULTRAM) 50 MG tablet Take 1 tablet (50 mg total) by mouth every 8 (eight) hours as needed. 60 tablet 1   No facility-administered medications prior to visit.      Allergies:   Iohexol and Sulfadiazine   Social History   Social History  . Marital status: Married    Spouse name: N/A  . Number of children: N/A  . Years of education: N/A   Social History Main Topics  . Smoking status: Never Smoker  . Smokeless tobacco: Never Used  . Alcohol use No  . Drug use: No  . Sexual activity: Yes   Other Topics Concern  . None    Social History Narrative   Travels a lot   Regular exercise - NO     Family History:  The patient's family history includes Diabetes in his father and mother; Heart disease in his father; Hypertension in his unknown relative; Mental illness (age of  onset: 81) in his mother.   ROS:   Please see the history of present illness.    ROS All other systems reviewed and are negative.   PHYSICAL EXAM:   VS:  BP 122/78   Pulse 71   Ht 6\' 1"  (1.854 m)   Wt 218 lb 6.4 oz (99.1 kg)   BMI 28.81 kg/m    GEN: Well nourished, well developed, in no acute distress  HEENT: normal  Neck: no JVD, carotid bruits, or masses Cardiac: RRR; no murmurs, rubs, or gallops,no edema  Respiratory:  clear to auscultation bilaterally, normal work of breathing GI: soft, nontender, nondistended, + BS, protuberant MS: no deformity or atrophy  Skin: warm and dry, no rash Neuro:  Alert and Oriented x 3, Strength and sensation are intact Psych: euthymic mood, full affect   Wt Readings from Last 3 Encounters:  01/29/17 218 lb 6.4 oz (99.1 kg)  11/02/16 213 lb 1.3 oz (96.7 kg)  08/03/16 221 lb (100.2 kg)      Studies/Labs Reviewed:   EKG:  EKG is ordered today.  The ekg ordered today demonstrates 01/29/17-sinus rhythm first degree AV block PR interval 218 ms with poor R wave progression personally viewed 01/30/16-sinus rhythm, old inferior infarct pattern, heart rate 81 bpm, PR interval 208 ms. No ST segment changes.  Recent Labs: 10/29/2016: BUN 21; Creatinine, Ser 1.26; Potassium 4.1; Sodium 141   Lipid Panel    Component Value Date/Time   CHOL 144 05/24/2014 0734   TRIG 231.0 (H) 05/24/2014 0734   TRIG 270 (HH) 07/09/2006 1702   HDL 30.20 (L) 05/24/2014 0734   CHOLHDL 5 05/24/2014 0734   VLDL 46.2 (H) 05/24/2014 0734   LDLCALC 68 02/02/2014 0734   LDLDIRECT 77.2 05/24/2014 0734    Additional studies/ records that were reviewed today include:  Prior office notes, EKG, lab work  reviewed    ASSESSMENT:    1. Essential hypertension   2. Coronary artery disease involving coronary bypass graft of native heart without angina pectoris   3. Dyslipidemia      PLAN:  In order of problems listed above:  CAD/CABG 2009  - Overall doing. NO SIGNIFICANT ANGINAL SYMPTOMS. STRESS TEST PERFORMED ON 02/07/16 WAS OVERALL LOW RISK BUT NO REVERSIBLE ISCHEMIA. EXCELLENT.  Diabetes with associated hypertension  - Working with Dr. Alain Marion.  - Medications reviewed, stable.  Hyperlipidemia  - Continuing with atorvastatin. No side effects. Also taking over-the-counter niacin. My latest LDL was from November 23 of 2015 on KPN tool. I asked him to make sure that he gets this checked with Dr. Alain Marion.  Overweight  - Mostly in the abdominal region. Decrease carbohydrates. We discussed once again. Weight has been fairly stable.   Medication Adjustments/Labs and Tests Ordered: Current medicines are reviewed at length with the patient today.  Concerns regarding medicines are outlined above.  Medication changes, Labs and Tests ordered today are listed in the Patient Instructions below. Patient Instructions  Medication Instructions:  The current medical regimen is effective;  continue present plan and medications.  Follow-Up: Follow up in 1 year with Dr. Marlou Porch.  You will receive a letter in the mail 2 months before you are due.  Please call us when you receive this letter to schedule your follow up appointment.  If you need a refill on your cardiac medications before your next appointment, please call your pharmacy.  Thank you for choosing [facility]!!        Signed, Candee Furbish, MD  01/29/2017 10:53 AM    [address], [place], [place]  [address] Phone: [phone]; Fax: [phone]

## 2017-01-29 NOTE — Patient Instructions (Signed)

## 2017-02-08 ENCOUNTER — Other Ambulatory Visit (INDEPENDENT_AMBULATORY_CARE_PROVIDER_SITE_OTHER): Payer: 59

## 2017-02-08 DIAGNOSIS — E119 Type 2 diabetes mellitus without complications: Secondary | ICD-10-CM | POA: Diagnosis not present

## 2017-02-08 LAB — BASIC METABOLIC PANEL
BUN: 20 mg/dL (ref 6–23)
CALCIUM: 9.4 mg/dL (ref 8.4–10.5)
CO2: 26 meq/L (ref 19–32)
CREATININE: 1.4 mg/dL (ref 0.40–1.50)
Chloride: 104 mEq/L (ref 96–112)
GFR: 53.84 mL/min — ABNORMAL LOW (ref >60.00)
GLUCOSE: 178 mg/dL — AB (ref 70–99)
Potassium: 3.9 mEq/L (ref 3.5–5.1)
Sodium: 139 mEq/L (ref 135–145)

## 2017-02-08 LAB — HEMOGLOBIN A1C: Hgb A1c MFr Bld: 7.3 % — ABNORMAL HIGH (ref 4.6–6.5)

## 2017-02-15 ENCOUNTER — Ambulatory Visit (INDEPENDENT_AMBULATORY_CARE_PROVIDER_SITE_OTHER): Payer: 59 | Admitting: Internal Medicine

## 2017-02-15 ENCOUNTER — Encounter: Payer: Self-pay | Admitting: Internal Medicine

## 2017-02-15 DIAGNOSIS — I10 Essential (primary) hypertension: Secondary | ICD-10-CM | POA: Diagnosis not present

## 2017-02-15 DIAGNOSIS — E669 Obesity, unspecified: Secondary | ICD-10-CM

## 2017-02-15 DIAGNOSIS — I2510 Atherosclerotic heart disease of native coronary artery without angina pectoris: Secondary | ICD-10-CM

## 2017-02-15 DIAGNOSIS — E785 Hyperlipidemia, unspecified: Secondary | ICD-10-CM

## 2017-02-15 DIAGNOSIS — E1169 Type 2 diabetes mellitus with other specified complication: Secondary | ICD-10-CM | POA: Diagnosis not present

## 2017-02-15 DIAGNOSIS — E1159 Type 2 diabetes mellitus with other circulatory complications: Secondary | ICD-10-CM | POA: Diagnosis not present

## 2017-02-15 MED ORDER — CIPROFLOXACIN HCL 500 MG PO TABS: 500.0000 mg | ORAL_TABLET | Freq: Two times a day (BID) | ORAL | 1 refills | Status: DC

## 2017-02-15 MED ORDER — ATOVAQUONE-PROGUANIL HCL 250-100 MG PO TABS: ORAL_TABLET | ORAL | 2 refills | Status: DC
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
## 2017-02-15 NOTE — Assessment & Plan Note (Signed)
Lipitor, ASA 

## 2017-02-15 NOTE — Assessment & Plan Note (Addendum)
On Farxiga Glipizide, Metformin

## 2017-02-15 NOTE — Assessment & Plan Note (Signed)
Lipitor 

## 2017-02-15 NOTE — Assessment & Plan Note (Signed)
Enalapril 

## 2017-03-02 ENCOUNTER — Other Ambulatory Visit: Payer: Self-pay | Admitting: Internal Medicine

## 2017-03-05 ENCOUNTER — Other Ambulatory Visit: Payer: Self-pay | Admitting: General Practice

## 2017-03-05 MED ORDER — GLUCOSE BLOOD VI STRP: ORAL_STRIP | 4 refills | Status: DC

## 2017-03-05 MED ORDER — ONETOUCH DELICA LANCETS 33G MISC: 1 refills | Status: DC

## 2017-03-05 NOTE — Telephone Encounter (Signed)
[name] [name],  Can you look into this one?

## 2017-03-07 ENCOUNTER — Other Ambulatory Visit: Payer: Self-pay | Admitting: Internal Medicine

## 2017-03-12 ENCOUNTER — Other Ambulatory Visit: Payer: Self-pay | Admitting: Internal Medicine

## 2017-05-13 ENCOUNTER — Other Ambulatory Visit (INDEPENDENT_AMBULATORY_CARE_PROVIDER_SITE_OTHER): Payer: 59

## 2017-05-13 DIAGNOSIS — E119 Type 2 diabetes mellitus without complications: Secondary | ICD-10-CM | POA: Diagnosis not present

## 2017-05-13 LAB — BASIC METABOLIC PANEL
BUN: 18 mg/dL (ref 6–23)
CALCIUM: 9.7 mg/dL (ref 8.4–10.5)
CO2: 27 meq/L (ref 19–32)
Chloride: 102 mEq/L (ref 96–112)
Creatinine, Ser: 1.19 mg/dL (ref 0.40–1.50)
GFR: 64.9 mL/min (ref >60.00)
GLUCOSE: 179 mg/dL — AB (ref 70–99)
POTASSIUM: 4.2 meq/L (ref 3.5–5.1)
SODIUM: 138 meq/L (ref 135–145)

## 2017-05-13 LAB — HEMOGLOBIN A1C: Hgb A1c MFr Bld: 7.4 % — ABNORMAL HIGH (ref 4.6–6.5)

## 2017-05-17 ENCOUNTER — Encounter: Payer: Self-pay | Admitting: Internal Medicine

## 2017-05-17 ENCOUNTER — Ambulatory Visit (INDEPENDENT_AMBULATORY_CARE_PROVIDER_SITE_OTHER): Payer: 59 | Admitting: Internal Medicine

## 2017-05-17 DIAGNOSIS — I10 Essential (primary) hypertension: Secondary | ICD-10-CM

## 2017-05-17 DIAGNOSIS — K219 Gastro-esophageal reflux disease without esophagitis: Secondary | ICD-10-CM

## 2017-05-17 DIAGNOSIS — I152 Hypertension secondary to endocrine disorders: Secondary | ICD-10-CM

## 2017-05-17 DIAGNOSIS — Z7189 Other specified counseling: Secondary | ICD-10-CM

## 2017-05-17 DIAGNOSIS — Z951 Presence of aortocoronary bypass graft: Secondary | ICD-10-CM | POA: Diagnosis not present

## 2017-05-17 DIAGNOSIS — E1169 Type 2 diabetes mellitus with other specified complication: Secondary | ICD-10-CM | POA: Diagnosis not present

## 2017-05-17 DIAGNOSIS — E669 Obesity, unspecified: Secondary | ICD-10-CM | POA: Diagnosis not present

## 2017-05-17 DIAGNOSIS — E1159 Type 2 diabetes mellitus with other circulatory complications: Secondary | ICD-10-CM | POA: Diagnosis not present

## 2017-05-17 DIAGNOSIS — Z7184 Encounter for health counseling related to travel: Secondary | ICD-10-CM

## 2017-05-17 MED ORDER — ENALAPRIL MALEATE 5 MG PO TABS: ORAL_TABLET | ORAL | 3 refills | Status: DC

## 2017-05-17 MED ORDER — METFORMIN HCL 1000 MG PO TABS: ORAL_TABLET | ORAL | 3 refills | Status: DC

## 2017-05-17 MED ORDER — CIPROFLOXACIN HCL 500 MG PO TABS: 500.0000 mg | ORAL_TABLET | Freq: Two times a day (BID) | ORAL | 1 refills | Status: DC

## 2017-05-17 MED ORDER — DAPAGLIFLOZIN PROPANEDIOL 5 MG PO TABS: 5.0000 mg | ORAL_TABLET | Freq: Every day | ORAL | 3 refills | Status: DC

## 2017-05-17 MED ORDER — ATORVASTATIN CALCIUM 80 MG PO TABS: 80.0000 mg | ORAL_TABLET | Freq: Every day | ORAL | 1 refills | Status: DC

## 2017-05-17 MED ORDER — NITROGLYCERIN 0.4 MG SL SUBL
0.4000 mg | SUBLINGUAL_TABLET | SUBLINGUAL | 3 refills | Status: DC | PRN
Start: 2017-05-17 — End: 2017-08-25

## 2017-05-17 MED ORDER — BUPROPION HCL ER (XL) 150 MG PO TB24: 150.0000 mg | ORAL_TABLET | Freq: Every day | ORAL | 3 refills | Status: DC

## 2017-05-17 MED ORDER — INDOMETHACIN ER 75 MG PO CPCR: 75.0000 mg | ORAL_CAPSULE | Freq: Two times a day (BID) | ORAL | 3 refills | Status: DC

## 2017-05-17 MED ORDER — ATOVAQUONE-PROGUANIL HCL 250-100 MG PO TABS: ORAL_TABLET | ORAL | 2 refills | Status: DC

## 2017-05-17 MED ORDER — VALACYCLOVIR HCL 500 MG PO TABS: 500.0000 mg | ORAL_TABLET | Freq: Three times a day (TID) | ORAL | 2 refills | Status: DC

## 2017-05-17 MED ORDER — GLIPIZIDE ER 5 MG PO TB24: ORAL_TABLET | ORAL | 3 refills | Status: DC

## 2017-05-17 NOTE — Assessment & Plan Note (Signed)
Enalapril 

## 2017-05-17 NOTE — Assessment & Plan Note (Signed)
Prn meds 

## 2017-05-17 NOTE — Assessment & Plan Note (Signed)
ASA, Lipitor 

## 2017-05-17 NOTE — Assessment & Plan Note (Signed)
Farxiga, Glipizide, Metformin

## 2017-05-17 NOTE — Assessment & Plan Note (Signed)
meds renewed
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
## 2017-06-01 ENCOUNTER — Other Ambulatory Visit: Payer: Self-pay | Admitting: Internal Medicine

## 2017-08-16 ENCOUNTER — Other Ambulatory Visit: Payer: Self-pay | Admitting: Internal Medicine

## 2017-08-16 ENCOUNTER — Other Ambulatory Visit (INDEPENDENT_AMBULATORY_CARE_PROVIDER_SITE_OTHER): Payer: 59

## 2017-08-16 ENCOUNTER — Ambulatory Visit: Payer: 59 | Admitting: Internal Medicine

## 2017-08-16 DIAGNOSIS — E119 Type 2 diabetes mellitus without complications: Secondary | ICD-10-CM

## 2017-08-16 LAB — BASIC METABOLIC PANEL
BUN: 22 mg/dL (ref 6–23)
CALCIUM: 9.5 mg/dL (ref 8.4–10.5)
CO2: 28 meq/L (ref 19–32)
Chloride: 103 mEq/L (ref 96–112)
Creatinine, Ser: 1.26 mg/dL (ref 0.40–1.50)
GFR: 60.71 mL/min (ref >60.00)
Glucose, Bld: 139 mg/dL — ABNORMAL HIGH (ref 70–99)
POTASSIUM: 3.8 meq/L (ref 3.5–5.1)
Sodium: 140 mEq/L (ref 135–145)

## 2017-08-16 LAB — HEMOGLOBIN A1C: Hgb A1c MFr Bld: 6.8 % — ABNORMAL HIGH (ref 4.6–6.5)

## 2017-08-20 ENCOUNTER — Ambulatory Visit (INDEPENDENT_AMBULATORY_CARE_PROVIDER_SITE_OTHER): Payer: 59 | Admitting: Internal Medicine

## 2017-08-20 ENCOUNTER — Encounter: Payer: Self-pay | Admitting: Internal Medicine

## 2017-08-20 VITALS — BP 126/70 | HR 73 | Temp 98.0°F | Ht 73.0 in | Wt 207.0 lb

## 2017-08-20 DIAGNOSIS — E785 Hyperlipidemia, unspecified: Secondary | ICD-10-CM | POA: Diagnosis not present

## 2017-08-20 DIAGNOSIS — E1159 Type 2 diabetes mellitus with other circulatory complications: Secondary | ICD-10-CM | POA: Diagnosis not present

## 2017-08-20 DIAGNOSIS — Z951 Presence of aortocoronary bypass graft: Secondary | ICD-10-CM | POA: Diagnosis not present

## 2017-08-20 DIAGNOSIS — E663 Overweight: Secondary | ICD-10-CM | POA: Diagnosis not present

## 2017-08-20 DIAGNOSIS — M545 Low back pain, unspecified: Secondary | ICD-10-CM

## 2017-08-20 DIAGNOSIS — I2510 Atherosclerotic heart disease of native coronary artery without angina pectoris: Secondary | ICD-10-CM

## 2017-08-20 DIAGNOSIS — E1169 Type 2 diabetes mellitus with other specified complication: Secondary | ICD-10-CM

## 2017-08-20 DIAGNOSIS — I10 Essential (primary) hypertension: Secondary | ICD-10-CM

## 2017-08-20 DIAGNOSIS — E669 Obesity, unspecified: Secondary | ICD-10-CM
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
## 2017-08-20 NOTE — Assessment & Plan Note (Signed)
Lipitor 

## 2017-08-20 NOTE — Assessment & Plan Note (Signed)
ASA and Lipitor

## 2017-08-20 NOTE — Assessment & Plan Note (Signed)
Enalapril 

## 2017-08-20 NOTE — Assessment & Plan Note (Signed)
New shoes

## 2017-08-20 NOTE — Assessment & Plan Note (Signed)
Wt Readings from Last 3 Encounters:  08/20/17 207 lb (93.9 kg)  05/17/17 210 lb (95.3 kg)  02/15/17 214 lb (97.1 kg)

## 2017-08-20 NOTE — Assessment & Plan Note (Signed)
Lipitor, ASA 

## 2017-08-20 NOTE — Assessment & Plan Note (Signed)
Glipizide, Metformin ?

## 2017-08-25 ENCOUNTER — Other Ambulatory Visit: Payer: Self-pay | Admitting: Internal Medicine

## 2017-08-26 ENCOUNTER — Telehealth: Payer: Self-pay | Admitting: Internal Medicine

## 2017-08-26 NOTE — Telephone Encounter (Signed)
Copied from [place] [id number]. Topic: Quick Communication - See Telephone Encounter >> Aug 26, 2017  4:02 PM Bea Graff, NT wrote: CRM for notification. See Telephone encounter for: Pts wife states the pharmacy needs the dr to call and give a clinical reason for them to be able to fill the OneTouch test strips. Insurance keeps not letting it be refilled. [facility] [address]. Please call wife. Pt is in [place].  08/26/17.

## 2017-08-27 MED ORDER — GLUCOSE BLOOD VI STRP: ORAL_STRIP | 1 refills | Status: DC

## 2017-08-27 NOTE — Telephone Encounter (Signed)
Updated script w/ICD 10 code and [facility]../lmb

## 2017-08-28 ENCOUNTER — Telehealth: Payer: Self-pay | Admitting: Internal Medicine

## 2017-08-28 NOTE — Telephone Encounter (Signed)
Copied from [place] [id number]. Topic: Quick Communication - See Telephone Encounter >> Aug 26, 2017  4:02 PM Bea Graff, NT wrote: CRM for notification. See Telephone encounter for: Pts wife states the pharmacy needs the dr to call and give a clinical reason for them to be able to fill the OneTouch test strips. Insurance keeps not letting it be refilled. [facility] [address].   08/26/17. >> Aug 28, 2017  4:55 PM Neva Seat wrote: One Touch ultra test strips 5 times a day   [facility]  [phone]  Is needing a clinical reason for insurance to approve the test strips pt is currently using.  Please call [facility] to discuss for pt to get the strips asap.   [facility] [place]

## 2017-08-29 MED ORDER — ACCU-CHEK NANO SMARTVIEW W/DEVICE KIT: PACK | 0 refills | Status: DC

## 2017-08-29 MED ORDER — GLUCOSE BLOOD VI STRP: ORAL_STRIP | 3 refills | Status: DC

## 2017-08-29 MED ORDER — ACCU-CHEK FASTCLIX LANCETS MISC: 3 refills | Status: DC

## 2017-08-29 NOTE — Telephone Encounter (Signed)
Insurance has contract with [employer] this year and will only pay for them, accu-chek supplies sent to pharmacy

## 2017-08-29 NOTE — Telephone Encounter (Addendum)
Wife calling back, states office needs to call insurance, stating that he is a traveler and these test strips are needed. Call back to insurance at [phone]. Requesting auth to be approved for years, if available?  Also not sure if lancets will require PA as well.

## 2017-08-29 NOTE — Telephone Encounter (Signed)
Pt's wife states she has a number for prior auth [phone], and that he needs to have this and that he travels out of the country  Pt's states wants to have One touch ultra test strips and lancets Contact pt's wife

## 2017-08-30 NOTE — Telephone Encounter (Signed)
PA started

## 2017-09-02 NOTE — Telephone Encounter (Signed)
PA denied due to pt not having an insulin pump or no reason the pt cannot use accu-chek supplies. Pharmacy notified and LM notifying wife.

## 2017-09-06 ENCOUNTER — Ambulatory Visit (INDEPENDENT_AMBULATORY_CARE_PROVIDER_SITE_OTHER): Payer: 59 | Admitting: Internal Medicine

## 2017-09-06 ENCOUNTER — Encounter: Payer: Self-pay | Admitting: Internal Medicine

## 2017-09-06 VITALS — BP 144/78 | HR 87 | Temp 98.2°F | Ht 73.0 in | Wt 211.0 lb

## 2017-09-06 DIAGNOSIS — E1159 Type 2 diabetes mellitus with other circulatory complications: Secondary | ICD-10-CM

## 2017-09-06 DIAGNOSIS — E669 Obesity, unspecified: Secondary | ICD-10-CM

## 2017-09-06 DIAGNOSIS — R059 Cough, unspecified: Secondary | ICD-10-CM

## 2017-09-06 DIAGNOSIS — E1169 Type 2 diabetes mellitus with other specified complication: Secondary | ICD-10-CM | POA: Diagnosis not present

## 2017-09-06 DIAGNOSIS — R05 Cough: Secondary | ICD-10-CM

## 2017-09-06 DIAGNOSIS — I10 Essential (primary) hypertension: Secondary | ICD-10-CM | POA: Diagnosis not present

## 2017-09-06 DIAGNOSIS — I152 Hypertension secondary to endocrine disorders: Secondary | ICD-10-CM

## 2017-09-06 MED ORDER — HYDROCODONE-HOMATROPINE 5-1.5 MG/5ML PO SYRP: 5.0000 mL | ORAL_SOLUTION | Freq: Four times a day (QID) | ORAL | 0 refills | Status: AC | PRN

## 2017-09-06 MED ORDER — LEVOFLOXACIN 500 MG PO TABS: 500.0000 mg | ORAL_TABLET | Freq: Every day | ORAL | 0 refills | Status: AC

## 2017-09-06 NOTE — Patient Instructions (Signed)
Please take all new medication as prescribed - the antibiotic, and cough medicine if needed  Please continue all other medications as before, and refills have been done if requested.  Please have the pharmacy call with any other refills you may need.  Please keep your appointments with your specialists as you may have planned  Good Luck in [place]

## 2017-09-06 NOTE — Progress Notes (Signed)
Subjective:    Patient ID: [name] [name]., male    DOB: 19-Jun-1951, 67 y.o.   MRN: [id number]  HPI  Here with acute onset mild to mod 2-3 days ST, HA, general weakness and malaise, with prod cough greenish sputum, but Pt denies chest pain, increased sob or doe, wheezing, orthopnea, PND, increased LE swelling, palpitations, dizziness or syncope.  Leaving for out of country in a few days.  Pt denies new neurological symptoms such as new headache, or facial or extremity weakness or numbness   Pt denies polydipsia, polyuria Past Medical History:  Diagnosis Date  . Aortic root dilatation (HCC)    slight, echo 2009  . Blood transfusion without reported diagnosis   . CAD (coronary artery disease)    Nuclear, Jul 04, 2009 NO ischemia  . Diabetes mellitus   . Diverticulosis   . Herpes simplex   . Hx of CABG 04/2008   October, 2009  . Hyperlipidemia   . Low testosterone 2010   LOW DHEA  . Microalbuminuria   . Snoring    prior history of surgery for sleep apnea   Past Surgical History:  Procedure Laterality Date  . APPENDECTOMY  1978  . CHOLECYSTECTOMY  2009  . CORONARY ARTERY BYPASS GRAFT  2009  . Removal of Blue     ENT  . TONSILECTOMY, ADENOIDECTOMY, BILATERAL MYRINGOTOMY AND TUBES      reports that  has never smoked. he has never used smokeless tobacco. He reports that he does not drink alcohol or use drugs. family history includes Diabetes in his father and mother; Heart disease in his father; Hypertension in his unknown relative; Mental illness (age of onset: 72) in his mother. Allergies  Allergen Reactions  . Iohexol      Desc: itching and hives   . Sulfadiazine    Current Outpatient Medications on File Prior to Visit  Medication Sig Dispense Refill  . ACCU-CHEK FASTCLIX LANCETS MISC Use to check blood sugar 5 times a day. DX: E11.69 300 each 3  . aspirin 81 MG tablet Take 1 tablet (81 mg total) by mouth daily.    Marland Kitchen atorvastatin (LIPITOR) 80 MG tablet TAKE 1 TABLET  BY MOUTH EVERY DAY 90 tablet 3  . atovaquone-proguanil (MALARONE) 250-100 MG TABS tablet TAKE 1 TABLET BY MOUTH EVERY DAY OR TWICE A DAY USE DURING AND 7 DAYS AFTER TRIP 90 tablet 2  . Blood Glucose Monitoring Suppl (ACCU-CHEK NANO SMARTVIEW) w/Device KIT Use to check blood sugar 5 times a day. DX: E11.69 1 kit 0  . buPROPion (WELLBUTRIN XL) 150 MG 24 hr tablet Take 1 tablet (150 mg total) daily by mouth. 90 tablet 3  . Cholecalciferol 1000 UNITS tablet Take 1 tablet (1,000 Units total) by mouth daily. 200 tablet 3  . ciprofloxacin (CIPRO) 500 MG tablet Take 1 tablet (500 mg total) 2 (two) times daily by mouth. 60 tablet 1  . dapagliflozin propanediol (FARXIGA) 5 MG TABS tablet Take 5 mg daily by mouth. 90 tablet 3  . Diclofenac Sodium (PENNSAID) 2 % SOLN Apply twice daily to affected area 1 Bottle 2  . diphenhydrAMINE (BENADRYL) 25 MG tablet Take 1-2 tablets (25-50 mg total) by mouth every 8 (eight) hours as needed for allergies. 60 tablet 5  . enalapril (VASOTEC) 5 MG tablet TAKE 2 TABLETS BY MOUTH EVERY DAY 180 tablet 3  . glipiZIDE (GLUCOTROL XL) 5 MG 24 hr tablet Take 2 tablets in am or one twice a day 180  tablet 3  . glucose blood (ACCU-CHEK SMARTVIEW) test strip Use to check blood sugar 5 times a day. DX: E11.69 300 each 3  . ibuprofen (ADVIL,MOTRIN) 600 MG tablet TAKE 1 TABLET BY MOUTH EVERY 4 HOURS AS NEEDED FOR PAIN 30 tablet 0  . indomethacin (INDOCIN SR) 75 MG CR capsule Take 1 capsule (75 mg total) 2 (two) times daily with a meal by mouth. 30 capsule 3  . metFORMIN (GLUCOPHAGE) 1000 MG tablet TAKE 1 TABLET (1,000 MG TOTAL) BY MOUTH 2 (TWO) TIMES DAILY WITH A MEAL. FOR DIABETES 180 tablet 3  . metroNIDAZOLE (FLAGYL) 250 MG tablet TAKE 1 TABLET (250 MG TOTAL) BY MOUTH 3 (THREE) TIMES DAILY. 30 tablet 0  . niacin (NIASPAN) 500 MG CR tablet Take 1 tablet (500 mg total) by mouth at bedtime.    . nitroGLYCERIN (NITROSTAT) 0.4 MG SL tablet PLACE 1 TABLET EVERY 5 MINUTES AS NEEDED UNDER THE  TONGUE FOR CHEST PAIN 25 tablet 2  . OMEGA 3-6-9 FATTY ACIDS PO Take 1 tablet by mouth daily.     . valACYclovir (VALTREX) 500 MG tablet Take 1 tablet (500 mg total) 3 (three) times daily by mouth. 21 tablet 2   No current facility-administered medications on file prior to visit.    Review of Systems  Constitutional: Negative for other unusual diaphoresis or sweats HENT: Negative for ear discharge or swelling Eyes: Negative for other worsening visual disturbances Respiratory: Negative for stridor or other swelling  Gastrointestinal: Negative for worsening distension or other blood Genitourinary: Negative for retention or other urinary change Musculoskeletal: Negative for other MSK pain or swelling Skin: Negative for color change or other new lesions Neurological: Negative for worsening tremors and other numbness  Psychiatric/Behavioral: Negative for worsening agitation or other fatigue All other system neg per pt    Objective:   Physical Exam BP (!) 144/78   Pulse 87   Temp 98.2 F (36.8 C) (Oral)   Ht _0  (1.854 m)   Wt 211 lb (95.7 kg)   SpO2 96%   BMI 27.84 kg/m  VS noted, mild ill Constitutional: Pt appears in NAD HENT: Head: NCAT.  Right Ear: External ear normal.  Left Ear: External ear normal.  Eyes: . Pupils are equal, round, and reactive to light. Conjunctivae and EOM are normal Bilat tm's with mild erythema.  Max sinus areas non tender.  Pharynx with mild erythema, no exudate Nose: without d/c or deformity Neck: Neck supple. Gross normal ROM Cardiovascular: Normal rate and regular rhythm.   Pulmonary/Chest: Effort normal and breath sounds without rales or wheezing.  Neurological: Pt is alert. At baseline orientation, motor grossly intact Skin: Skin is warm. No rashes, other new lesions, no LE edema Psychiatric: Pt behavior is normal without agitation  No other exam findings    Assessment & Plan:

## 2017-09-08 ENCOUNTER — Encounter: Payer: Self-pay | Admitting: Internal Medicine

## 2017-09-08 DIAGNOSIS — R05 Cough: Secondary | ICD-10-CM | POA: Insufficient documentation

## 2017-09-08 DIAGNOSIS — R059 Cough, unspecified: Secondary | ICD-10-CM | POA: Insufficient documentation

## 2017-09-08 NOTE — Assessment & Plan Note (Signed)
Lab Results  Component Value Date   HGBA1C 6.8 (H) 08/16/2017  stable overall by history and exam, recent data reviewed with pt, and pt to continue medical treatment as before,  to f/u any worsening symptoms or concerns

## 2017-09-08 NOTE — Assessment & Plan Note (Signed)
Mild elev today likely situational, o/w stable overall by history and exam, recent data reviewed with pt, and pt to continue medical treatment as before,  to f/u any worsening symptoms or concerns BP Readings from Last 3 Encounters:  09/06/17 (!) 144/78  08/20/17 126/70  05/17/17 118/66

## 2017-09-08 NOTE — Assessment & Plan Note (Signed)
Mild to mod,  C/w bornchitis vs pna, for antibx course, declines cxr,  to f/u any worsening symptoms or concerns

## 2017-11-05 ENCOUNTER — Telehealth: Payer: Self-pay | Admitting: *Deleted

## 2017-11-05 DIAGNOSIS — Z1159 Encounter for screening for other viral diseases: Secondary | ICD-10-CM

## 2017-11-05 NOTE — Telephone Encounter (Signed)
Wife called - she has a pharmacy in [place], needs to have rx mailed to home address.  Mailing address on chart is correct.   Wife needs to have pcp or cma called with opinion of taking the vaccine.   cb is [phone]

## 2017-11-05 NOTE — Telephone Encounter (Signed)
Copied from [place] [id number]. Topic: General - Other >> Nov 05, 2017  3:59 PM Oneta Rack wrote: Caller name: Duskin,Barbara Relation to pt: spouse  Call back number: [phone]   Reason for call:  Patient insurance will cover 100% shingrix, spouse states pharmacy currently has in stock and would like to know how PCP feels about patient receiving vaccination. Spouse would like to speak with nurse today due to PCP recommendation, please advise

## 2017-11-06 MED ORDER — ZOSTER VAC RECOMB ADJUVANTED 50 MCG/0.5ML IM SUSR: 0.5000 mL | Freq: Once | INTRAMUSCULAR | 0 refills | Status: AC

## 2017-11-06 NOTE — Telephone Encounter (Signed)
RX mailed.

## 2017-11-08 ENCOUNTER — Other Ambulatory Visit: Payer: Self-pay | Admitting: Internal Medicine

## 2017-11-14 ENCOUNTER — Telehealth: Payer: Self-pay

## 2017-11-14 NOTE — Telephone Encounter (Signed)
Key: [id number]   Submitted today via Cover my Meds. Currently pending approval.

## 2017-12-03 ENCOUNTER — Encounter: Payer: 59 | Admitting: Internal Medicine

## 2017-12-06 ENCOUNTER — Other Ambulatory Visit (INDEPENDENT_AMBULATORY_CARE_PROVIDER_SITE_OTHER): Payer: 59

## 2017-12-06 DIAGNOSIS — E785 Hyperlipidemia, unspecified: Secondary | ICD-10-CM

## 2017-12-06 DIAGNOSIS — E1159 Type 2 diabetes mellitus with other circulatory complications: Secondary | ICD-10-CM

## 2017-12-06 DIAGNOSIS — I152 Hypertension secondary to endocrine disorders: Secondary | ICD-10-CM

## 2017-12-06 DIAGNOSIS — E669 Obesity, unspecified: Secondary | ICD-10-CM

## 2017-12-06 DIAGNOSIS — I2510 Atherosclerotic heart disease of native coronary artery without angina pectoris: Secondary | ICD-10-CM

## 2017-12-06 DIAGNOSIS — E119 Type 2 diabetes mellitus without complications: Secondary | ICD-10-CM

## 2017-12-06 DIAGNOSIS — Z1159 Encounter for screening for other viral diseases: Secondary | ICD-10-CM

## 2017-12-06 DIAGNOSIS — E1169 Type 2 diabetes mellitus with other specified complication: Secondary | ICD-10-CM

## 2017-12-06 DIAGNOSIS — I10 Essential (primary) hypertension: Secondary | ICD-10-CM

## 2017-12-06 LAB — BASIC METABOLIC PANEL
BUN: 20 mg/dL (ref 6–23)
CHLORIDE: 105 meq/L (ref 96–112)
CO2: 26 meq/L (ref 19–32)
CREATININE: 1.12 mg/dL (ref 0.40–1.50)
Calcium: 9.7 mg/dL (ref 8.4–10.5)
GFR: 69.48 mL/min (ref >60.00)
Glucose, Bld: 137 mg/dL — ABNORMAL HIGH (ref 70–99)
POTASSIUM: 4.3 meq/L (ref 3.5–5.1)
Sodium: 141 mEq/L (ref 135–145)

## 2017-12-06 LAB — LIPID PANEL
Cholesterol: 216 mg/dL — ABNORMAL HIGH (ref 0–200)
HDL: 32.8 mg/dL — ABNORMAL LOW (ref >39.00)
NonHDL: 183.35
Total CHOL/HDL Ratio: 7
Triglycerides: 235 mg/dL — ABNORMAL HIGH (ref 0.0–149.0)
VLDL: 47 mg/dL — AB (ref 0.0–40.0)

## 2017-12-06 LAB — HEPATIC FUNCTION PANEL
ALK PHOS: 49 U/L (ref 39–117)
ALT: 16 U/L (ref 0–53)
AST: 15 U/L (ref 0–37)
Albumin: 4.3 g/dL (ref 3.5–5.2)
BILIRUBIN TOTAL: 0.9 mg/dL (ref 0.2–1.2)
Bilirubin, Direct: 0.2 mg/dL (ref 0.0–0.3)
Total Protein: 6.6 g/dL (ref 6.0–8.3)

## 2017-12-06 LAB — URINALYSIS
Bilirubin Urine: NEGATIVE
Hgb urine dipstick: NEGATIVE
KETONES UR: NEGATIVE
Leukocytes, UA: NEGATIVE
Nitrite: NEGATIVE
PH: 5 (ref 5.0–8.0)
Specific Gravity, Urine: 1.02 (ref 1.000–1.030)
Total Protein, Urine: NEGATIVE
Urobilinogen, UA: 0.2 (ref 0.0–1.0)

## 2017-12-06 LAB — CBC WITH DIFFERENTIAL/PLATELET
BASOS PCT: 1 % (ref 0.0–3.0)
Basophils Absolute: 0.1 10*3/uL (ref 0.0–0.1)
EOS ABS: 0.2 10*3/uL (ref 0.0–0.7)
Eosinophils Relative: 4.7 % (ref 0.0–5.0)
HCT: 46.9 % (ref 39.0–52.0)
Hemoglobin: 16.3 g/dL (ref 13.0–17.0)
LYMPHS ABS: 1.9 10*3/uL (ref 0.7–4.0)
Lymphocytes Relative: 35.3 % (ref 12.0–46.0)
MCHC: 34.7 g/dL (ref 30.0–36.0)
MCV: 94.2 fl (ref 78.0–100.0)
Monocytes Absolute: 0.6 10*3/uL (ref 0.1–1.0)
Monocytes Relative: 11.4 % (ref 3.0–12.0)
NEUTROS ABS: 2.5 10*3/uL (ref 1.4–7.7)
NEUTROS PCT: 47.6 % (ref 43.0–77.0)
PLATELETS: 168 10*3/uL (ref 150.0–400.0)
RBC: 4.98 Mil/uL (ref 4.22–5.81)
RDW: 13.9 % (ref 11.5–15.5)
WBC: 5.3 10*3/uL (ref 4.0–10.5)

## 2017-12-06 LAB — HEMOGLOBIN A1C: HEMOGLOBIN A1C: 6.7 % — AB (ref 4.6–6.5)

## 2017-12-06 LAB — LDL CHOLESTEROL, DIRECT: Direct LDL: 143 mg/dL

## 2017-12-06 LAB — TSH: TSH: 1.9 u[IU]/mL (ref 0.35–4.50)

## 2017-12-06 LAB — PSA: PSA: 1.26 ng/mL (ref 0.10–4.00)

## 2017-12-07 LAB — HEPATITIS C ANTIBODY
HEP C AB: NONREACTIVE
SIGNAL TO CUT-OFF: 0.01 (ref <1.00)

## 2017-12-13 ENCOUNTER — Encounter

## 2017-12-13 ENCOUNTER — Encounter: Payer: Self-pay | Admitting: Internal Medicine

## 2017-12-13 ENCOUNTER — Ambulatory Visit (INDEPENDENT_AMBULATORY_CARE_PROVIDER_SITE_OTHER): Payer: 59 | Admitting: Internal Medicine

## 2017-12-13 VITALS — BP 132/64 | HR 69 | Temp 98.6°F | Ht 73.0 in | Wt 203.0 lb

## 2017-12-13 DIAGNOSIS — Z23 Encounter for immunization: Secondary | ICD-10-CM | POA: Diagnosis not present

## 2017-12-13 DIAGNOSIS — E785 Hyperlipidemia, unspecified: Secondary | ICD-10-CM | POA: Diagnosis not present

## 2017-12-13 DIAGNOSIS — I10 Essential (primary) hypertension: Secondary | ICD-10-CM

## 2017-12-13 DIAGNOSIS — E669 Obesity, unspecified: Secondary | ICD-10-CM

## 2017-12-13 DIAGNOSIS — Z7189 Other specified counseling: Secondary | ICD-10-CM

## 2017-12-13 DIAGNOSIS — E1169 Type 2 diabetes mellitus with other specified complication: Secondary | ICD-10-CM | POA: Diagnosis not present

## 2017-12-13 DIAGNOSIS — Z7184 Encounter for health counseling related to travel: Secondary | ICD-10-CM

## 2017-12-13 DIAGNOSIS — E1159 Type 2 diabetes mellitus with other circulatory complications: Secondary | ICD-10-CM | POA: Diagnosis not present

## 2017-12-13 DIAGNOSIS — Z951 Presence of aortocoronary bypass graft: Secondary | ICD-10-CM | POA: Diagnosis not present

## 2017-12-13 MED ORDER — ATORVASTATIN CALCIUM 80 MG PO TABS: 80.0000 mg | ORAL_TABLET | Freq: Every day | ORAL | 3 refills | Status: DC

## 2017-12-13 NOTE — Addendum Note (Signed)
Addended by: Karren Cobble on: 12/13/2017 09:45 AM   Modules accepted: Orders

## 2017-12-13 NOTE — Assessment & Plan Note (Signed)
Enalapril 

## 2017-12-13 NOTE — Patient Instructions (Signed)
Re-start Lipitor 

## 2017-12-13 NOTE — Assessment & Plan Note (Signed)
ASA Lipitor 

## 2017-12-13 NOTE — Assessment & Plan Note (Signed)
Twinrix Meningococcal vaccine
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
## 2017-12-13 NOTE — Assessment & Plan Note (Signed)
Farxiga

## 2017-12-13 NOTE — Assessment & Plan Note (Signed)
Re-start Lipitor - he was out x 2 wks

## 2018-01-09 ENCOUNTER — Other Ambulatory Visit: Payer: Self-pay | Admitting: Internal Medicine

## 2018-01-14 ENCOUNTER — Ambulatory Visit (INDEPENDENT_AMBULATORY_CARE_PROVIDER_SITE_OTHER): Payer: 59

## 2018-01-14 DIAGNOSIS — Z23 Encounter for immunization: Secondary | ICD-10-CM

## 2018-01-14 MED ORDER — GLUCOSE BLOOD VI STRP: 1.0000 | ORAL_STRIP | 5 refills | Status: DC | PRN

## 2018-01-14 MED ORDER — ONETOUCH DELICA LANCETS 33G MISC: 1 refills | Status: DC

## 2018-01-14 NOTE — Addendum Note (Signed)
Addended by: Karren Cobble on: 01/14/2018 10:09 AM   Modules accepted: Orders

## 2018-01-15 ENCOUNTER — Telehealth: Payer: Self-pay

## 2018-01-15 NOTE — Telephone Encounter (Signed)
rec'd RX refill from [facility] for Meclizine 12.5mg  tab. Pt has not has filled since 2012. Okay to refill?

## 2018-01-16 MED ORDER — MECLIZINE HCL 12.5 MG PO TABS: 12.5000 mg | ORAL_TABLET | Freq: Three times a day (TID) | ORAL | 5 refills | Status: DC | PRN

## 2018-01-16 NOTE — Telephone Encounter (Signed)
pls refill Thx

## 2018-01-16 NOTE — Telephone Encounter (Signed)
RX filled.

## 2018-01-29 ENCOUNTER — Other Ambulatory Visit: Payer: Self-pay | Admitting: Internal Medicine

## 2018-02-04 ENCOUNTER — Telehealth: Payer: Self-pay | Admitting: Internal Medicine

## 2018-02-04 MED ORDER — YELLOW FEVER VACCINE ~~LOC~~ INJ: 0.5000 mL | INJECTION | Freq: Once | SUBCUTANEOUS | 0 refills | Status: AC

## 2018-02-04 NOTE — Telephone Encounter (Signed)
Wife has picked up script signed by dr plotnikov for both herself and her husband

## 2018-02-04 NOTE — Telephone Encounter (Signed)
Called patient's wife for more information. She said that he is going to a Database administrator and was told that he needed the script.

## 2018-02-04 NOTE — Telephone Encounter (Signed)
Copied from [place] [phone]. Topic: Quick Communication - See Telephone Encounter >> Feb 04, 2018 10:44 AM Synthia Innocent wrote: CRM for notification. See Telephone encounter for: 02/04/18. Requesting written RX for yellow fever injection. Wife would like to pick up, patient leaving country. Need immediately, has appt for injection today. Please advise.

## 2018-03-26 ENCOUNTER — Other Ambulatory Visit (INDEPENDENT_AMBULATORY_CARE_PROVIDER_SITE_OTHER): Payer: 59

## 2018-03-26 DIAGNOSIS — E119 Type 2 diabetes mellitus without complications: Secondary | ICD-10-CM

## 2018-03-26 LAB — BASIC METABOLIC PANEL
BUN: 20 mg/dL (ref 6–23)
CHLORIDE: 103 meq/L (ref 96–112)
CO2: 29 mEq/L (ref 19–32)
Calcium: 9.9 mg/dL (ref 8.4–10.5)
Creatinine, Ser: 1.26 mg/dL (ref 0.40–1.50)
GFR: 60.6 mL/min (ref >60.00)
GLUCOSE: 121 mg/dL — AB (ref 70–99)
POTASSIUM: 4.6 meq/L (ref 3.5–5.1)
SODIUM: 140 meq/L (ref 135–145)

## 2018-03-26 LAB — HEMOGLOBIN A1C: Hgb A1c MFr Bld: 6.6 % — ABNORMAL HIGH (ref 4.6–6.5)

## 2018-04-01 ENCOUNTER — Ambulatory Visit (INDEPENDENT_AMBULATORY_CARE_PROVIDER_SITE_OTHER): Payer: 59 | Admitting: Internal Medicine

## 2018-04-01 ENCOUNTER — Telehealth: Payer: Self-pay | Admitting: Internal Medicine

## 2018-04-01 ENCOUNTER — Encounter: Payer: Self-pay | Admitting: Internal Medicine

## 2018-04-01 VITALS — BP 126/62 | HR 74 | Temp 98.7°F | Ht 73.0 in | Wt 206.0 lb

## 2018-04-01 DIAGNOSIS — E1169 Type 2 diabetes mellitus with other specified complication: Secondary | ICD-10-CM

## 2018-04-01 DIAGNOSIS — E669 Obesity, unspecified: Secondary | ICD-10-CM

## 2018-04-01 DIAGNOSIS — E1159 Type 2 diabetes mellitus with other circulatory complications: Secondary | ICD-10-CM

## 2018-04-01 DIAGNOSIS — I2510 Atherosclerotic heart disease of native coronary artery without angina pectoris: Secondary | ICD-10-CM

## 2018-04-01 DIAGNOSIS — I10 Essential (primary) hypertension: Secondary | ICD-10-CM

## 2018-04-01 DIAGNOSIS — Z7184 Encounter for health counseling related to travel: Secondary | ICD-10-CM

## 2018-04-01 MED ORDER — VALACYCLOVIR HCL 500 MG PO TABS: 500.0000 mg | ORAL_TABLET | Freq: Three times a day (TID) | ORAL | 2 refills | Status: DC

## 2018-04-01 MED ORDER — ACCU-CHEK FASTCLIX LANCETS MISC: 3 refills | Status: DC

## 2018-04-01 MED ORDER — METFORMIN HCL 1000 MG PO TABS: ORAL_TABLET | ORAL | 3 refills | Status: DC

## 2018-04-01 MED ORDER — GLIPIZIDE ER 5 MG PO TB24: ORAL_TABLET | ORAL | 3 refills | Status: DC

## 2018-04-01 MED ORDER — BUPROPION HCL ER (XL) 150 MG PO TB24: 150.0000 mg | ORAL_TABLET | Freq: Every day | ORAL | 3 refills | Status: DC

## 2018-04-01 MED ORDER — ATOVAQUONE-PROGUANIL HCL 250-100 MG PO TABS: ORAL_TABLET | ORAL | 4 refills | Status: DC

## 2018-04-01 MED ORDER — INDOMETHACIN ER 75 MG PO CPCR: 75.0000 mg | ORAL_CAPSULE | Freq: Two times a day (BID) | ORAL | 3 refills | Status: DC

## 2018-04-01 MED ORDER — NITROGLYCERIN 0.4 MG SL SUBL: SUBLINGUAL_TABLET | SUBLINGUAL | 1 refills | Status: DC

## 2018-04-01 MED ORDER — ENALAPRIL MALEATE 5 MG PO TABS: ORAL_TABLET | ORAL | 3 refills | Status: DC

## 2018-04-01 MED ORDER — GLUCOSE BLOOD VI STRP: ORAL_STRIP | 3 refills | Status: DC

## 2018-04-01 MED ORDER — ATORVASTATIN CALCIUM 80 MG PO TABS: 80.0000 mg | ORAL_TABLET | Freq: Every day | ORAL | 3 refills | Status: DC

## 2018-04-01 MED ORDER — ACCU-CHEK GUIDE ME W/DEVICE KIT: 1.0000 [IU] | PACK | Freq: Three times a day (TID) | 1 refills | Status: DC

## 2018-04-01 NOTE — Assessment & Plan Note (Signed)
Glipizide, Metformin Farxiga added
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
## 2018-04-01 NOTE — Assessment & Plan Note (Signed)
Enalapril 

## 2018-04-01 NOTE — Assessment & Plan Note (Signed)
[place] trips

## 2018-04-01 NOTE — Telephone Encounter (Signed)
RX printed and placed up front, wife notified

## 2018-04-01 NOTE — Assessment & Plan Note (Signed)
F/u w/Dr Marlou Porch Lipitor, ASA

## 2018-04-01 NOTE — Telephone Encounter (Signed)
Copied from [place] [id number]. Topic: Quick Communication - See Telephone Encounter >> Apr 01, 2018  2:28 PM Conception Chancy, NT wrote: CRM for notification. See Telephone encounter for: 04/01/18.  Patient wife is calling and is requesting Inez Catalina to give her a call. She states he is needing 2 prescriptions refilled in a paper script with the same date as the ones that were sent in today.  buPROPion (WELLBUTRIN XL) 150 MG 24 hr tablet valACYclovir (VALTREX) 500 MG tablet  Please advise.

## 2018-04-02 ENCOUNTER — Other Ambulatory Visit: Payer: Self-pay

## 2018-04-02 MED ORDER — DAPAGLIFLOZIN PROPANEDIOL 5 MG PO TABS: 5.0000 mg | ORAL_TABLET | Freq: Every day | ORAL | 3 refills | Status: DC

## 2018-04-10 ENCOUNTER — Other Ambulatory Visit: Payer: Self-pay | Admitting: Internal Medicine

## 2018-04-14 ENCOUNTER — Telehealth: Payer: Self-pay | Admitting: Internal Medicine

## 2018-04-14 MED ORDER — GLUCOSE BLOOD VI STRP: ORAL_STRIP | 3 refills | Status: DC

## 2018-04-14 NOTE — Telephone Encounter (Signed)
Copied from [place]. Topic: Quick Communication - See Telephone Encounter >> Apr 14, 2018 11:43 AM Conception Chancy, NT wrote: CRM for notification. See Telephone encounter for: 04/14/18.  Patient wife is calling, requesting to speak with Inez Catalina as she is needing a written prescription for patients glucose blood (ACCU-CHEK GUIDE) test strip. She states it is needing to be written as 5x a day with 450 strips. Please advise.

## 2018-04-14 NOTE — Addendum Note (Signed)
Addended by: Aviva Signs M on: 04/14/2018 04:56 PM   Modules accepted: Orders

## 2018-04-14 NOTE — Telephone Encounter (Signed)
Contacted pharmacy to confirm if rx has been filled or printed rx had been given to pharmacy. Pharmacy stated that insurance will only pay for it up to twice a day. Erx sent for same. Detailed message left for pt spouse Pamala Hurry) informing of same as well.

## 2018-04-23 ENCOUNTER — Other Ambulatory Visit: Payer: Self-pay | Admitting: Internal Medicine

## 2018-04-24 MED ORDER — ONETOUCH DELICA LANCETS 33G MISC: 1 refills | Status: DC

## 2018-04-24 NOTE — Addendum Note (Signed)
Addended by: Karren Cobble on: 04/24/2018 09:36 AM   Modules accepted: Orders

## 2018-05-09 ENCOUNTER — Other Ambulatory Visit: Payer: Self-pay | Admitting: Internal Medicine

## 2018-05-09 NOTE — Telephone Encounter (Signed)
Copied from [place] [phone]. Topic: Quick Communication - Rx Refill/Question >> May 09, 2018  4:53 PM Reyne Dumas L wrote: Medication:  One Touch Delica Plus lancet 33 gauge - 5x a day 450 for 90 day supply AccuCheck Fast Click Drum - 5x day 287 for 90 day supply  Has the patient contacted their pharmacy? Yes - needs updated 5x day (Agent: If no, request that the patient contact the pharmacy for the refill.) (Agent: If yes, when and what did the pharmacy advise?)  Preferred Pharmacy (with phone number or street name): [id number] [address], [place] [phone] (Phone) [phone] (Fax)  Agent: Please be advised that RX refills may take up to 3 business days. We ask that you follow-up with your pharmacy.

## 2018-05-12 ENCOUNTER — Telehealth: Payer: Self-pay | Admitting: Internal Medicine

## 2018-05-12 MED ORDER — ONETOUCH DELICA LANCETS 33G MISC: 3 refills | Status: DC

## 2018-05-12 NOTE — Telephone Encounter (Signed)
RX sent

## 2018-05-12 NOTE — Telephone Encounter (Signed)
Copied from [place] [phone]. Topic: Quick Communication - Rx Refill/Question >> May 12, 2018  1:12 PM Rayann Heman wrote: Medication: Jonetta Speak LANCETS 28Z [place] [[id number]] pt wife called and stated that the quantity needs to be fixed 450 instead of 400. Please advise [phone]

## 2018-05-12 NOTE — Telephone Encounter (Signed)
Requests One Touch Delica Lancets 38V Test 5 x day # 450 for 90 day supply. [place].  Also, please change the number of times pt. Test a day on medication list and he uses Accucheck Guide Me Meter. Would like this new meter.Thanks.

## 2018-06-10 ENCOUNTER — Other Ambulatory Visit: Payer: Self-pay | Admitting: Internal Medicine

## 2018-06-11 NOTE — Telephone Encounter (Signed)
Why so many Cipro tabs requested? Thx

## 2018-06-17 ENCOUNTER — Telehealth: Payer: Self-pay | Admitting: Emergency Medicine

## 2018-06-17 ENCOUNTER — Ambulatory Visit (INDEPENDENT_AMBULATORY_CARE_PROVIDER_SITE_OTHER): Payer: 59 | Admitting: Emergency Medicine

## 2018-06-17 DIAGNOSIS — Z23 Encounter for immunization: Secondary | ICD-10-CM

## 2018-06-17 MED ORDER — EMPAGLIFLOZIN 10 MG PO TABS: 10.0000 mg | ORAL_TABLET | Freq: Every day | ORAL | 3 refills | Status: DC

## 2018-06-17 NOTE — Telephone Encounter (Signed)
Pt came in for nurse visit today and stated that he will be changing insurance companies at the beginning of the year. His new plan will not cover Wilder Glade, they will only cover Jardiance and Invokana. Are you okay with pt switching to different medication?

## 2018-06-17 NOTE — Telephone Encounter (Signed)
Jardiance Thx

## 2018-06-17 NOTE — Telephone Encounter (Signed)
Spoke with pt's wife to inform.  

## 2018-06-23 ENCOUNTER — Telehealth: Payer: Self-pay | Admitting: Internal Medicine

## 2018-06-23 NOTE — Telephone Encounter (Signed)
Copied from [place] [phone]. Topic: Quick Communication - See Telephone Encounter >> Jun 23, 2018  5:06 PM Rutherford Nail, NT wrote: CRM for notification. See Telephone encounter for: 06/23/18. Patient's wife calling and states that the atorvastatin (LIPITOR) 80 MG tablet costs $47. Would like to know if it could be changed to 40mg  table 2x a day? States that the 40mg  2x a day would only be $8. Would also like a call to discuss the metFORMIN (GLUCOPHAGE) 1000 MG tablet and silodosin (RAPAFLO) 8 MG CAPS capsule. The silodosin (RAPAFLO) 8 MG CAPS capsule is not on current med list. Advised that he would likely need to be seen in the office and discuss this with Dr Alain Marion since it was last prescribed in 2018. Please advise.

## 2018-06-24 ENCOUNTER — Other Ambulatory Visit: Payer: Self-pay | Admitting: Internal Medicine

## 2018-06-24 NOTE — Telephone Encounter (Signed)
Copied from [place] [id number]. Topic: Quick Communication - Rx Refill/Question >> Jun 24, 2018 11:28 AM Windy Kalata wrote: Medication: ciprofloxacin (CIPRO) 500 MG tablet,indomethacin (INDOCIN SR) 75 MG CR capsule, atovaquone-proguanil (MALARONE) 250-100 MG TABS tablet,meclizine (ANTIVERT) 12.5 MG tablet, nitroGLYCERIN (NITROSTAT) 0.4 MG SL tablet , valACYclovir (VALTREX) 500 MG tablet, Metronidazole 250mg , 30 tablets, Silodosin 0.8 mg,   Has the patient contacted their pharmacy? Yes.   (Agent: If no, request that the patient contact the pharmacy for the refill.) (Agent: If yes, when and what did the pharmacy advise?)  Preferred Pharmacy (with phone number or street name): [id number] [address], [place] [phone] (Phone) [phone] (Fax)    Agent: Please be advised that RX refills may take up to 3 business days. We ask that you follow-up with your pharmacy.

## 2018-06-24 NOTE — Telephone Encounter (Signed)
Please advise about changing atorvastatin and refilling silodosin.

## 2018-06-25 MED ORDER — ATORVASTATIN CALCIUM 40 MG PO TABS: 40.0000 mg | ORAL_TABLET | Freq: Two times a day (BID) | ORAL | 3 refills | Status: DC

## 2018-06-25 NOTE — Telephone Encounter (Signed)
Ok 40 mg bid I' m not sure I understand other issues Thx

## 2018-06-26 NOTE — Telephone Encounter (Signed)
Pt use to take silodosin and stopped taking it but would like to restart the medication. It is okay for pt to start back?

## 2018-06-26 NOTE — Telephone Encounter (Signed)
Pt wife calling to check status

## 2018-06-27 MED ORDER — SILODOSIN 8 MG PO CAPS: 8.0000 mg | ORAL_CAPSULE | Freq: Every day | ORAL | 3 refills | Status: DC

## 2018-06-27 NOTE — Telephone Encounter (Signed)
Ok thx.

## 2018-06-27 NOTE — Addendum Note (Signed)
Addended by: Karren Cobble on: 06/27/2018 02:17 PM   Modules accepted: Orders

## 2018-06-27 NOTE — Telephone Encounter (Signed)
RX sent

## 2018-06-30 ENCOUNTER — Other Ambulatory Visit: Payer: Self-pay

## 2018-06-30 NOTE — Telephone Encounter (Signed)
Pt would like tadalafil refilled, last filled in 2015

## 2018-07-01 MED ORDER — TADALAFIL 5 MG PO TABS: ORAL_TABLET | ORAL | 11 refills | Status: DC

## 2018-07-03 ENCOUNTER — Ambulatory Visit: Payer: 59 | Admitting: Internal Medicine

## 2018-07-25 ENCOUNTER — Other Ambulatory Visit: Payer: Self-pay

## 2018-07-25 MED ORDER — GLUCOSE BLOOD VI STRP: ORAL_STRIP | 3 refills | Status: DC

## 2018-09-05 ENCOUNTER — Telehealth: Payer: Self-pay | Admitting: Internal Medicine

## 2018-09-05 ENCOUNTER — Other Ambulatory Visit: Payer: Self-pay | Admitting: Internal Medicine
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
## 2018-09-08 ENCOUNTER — Other Ambulatory Visit: Payer: Self-pay | Admitting: Internal Medicine

## 2018-09-08 MED ORDER — NIACIN ER (ANTIHYPERLIPIDEMIC) 500 MG PO TBCR: 500.0000 mg | EXTENDED_RELEASE_TABLET | Freq: Every day | ORAL | 1 refills | Status: DC

## 2018-09-08 MED ORDER — IBUPROFEN 600 MG PO TABS: 600.0000 mg | ORAL_TABLET | ORAL | 0 refills | Status: DC | PRN

## 2018-09-08 MED ORDER — ACCU-CHEK GUIDE ME W/DEVICE KIT: 1.0000 [IU] | PACK | Freq: Every day | 1 refills | Status: AC

## 2018-09-08 NOTE — Telephone Encounter (Signed)
Copied from [place] [id number]. Topic: General - Other >> Sep 05, 2018  4:11 PM Keene Breath wrote: Reason for CRM: Patient's wife called to ask that the nurse call her back regarding her husbands medication.  The wife stated that he has lost his medication or it was taken.  It was not quite clear what she was asking.  Wife stated also that she will be coming by the office to get a letter from the doctor.  She wasn't really clear on what she wanted in the letter either.  Please advise and call patient back at [phone]

## 2018-09-08 NOTE — Telephone Encounter (Deleted)
Copied from [place] [phone]. Topic: General - Other >> Sep 05, 2018  4:11 PM Keene Breath wrote: Reason for CRM: Patient's wife called to ask that the nurse call her back regarding her husbands medication.  The wife stated that he has lost his medication or it was taken.  It was not quite clear what she was asking.  Wife stated also that she will be coming by the office to get a letter from the doctor.  She wasn't really clear on what she wanted in the letter either.  Please advise and call patient back at [phone]

## 2018-09-08 NOTE — Telephone Encounter (Signed)
RX sent

## 2018-09-10 ENCOUNTER — Other Ambulatory Visit: Payer: Self-pay | Admitting: Internal Medicine

## 2018-09-13 ENCOUNTER — Other Ambulatory Visit: Payer: Self-pay | Admitting: Internal Medicine

## 2018-11-27 ENCOUNTER — Other Ambulatory Visit: Payer: Self-pay | Admitting: Internal Medicine

## 2019-02-06 ENCOUNTER — Other Ambulatory Visit: Payer: Self-pay | Admitting: Internal Medicine

## 2019-02-24 ENCOUNTER — Telehealth: Payer: Self-pay | Admitting: *Deleted

## 2019-02-24 DIAGNOSIS — Z0000 Encounter for general adult medical examination without abnormal findings: Secondary | ICD-10-CM

## 2019-02-24 NOTE — Telephone Encounter (Signed)
Labs ordered. Pt informed

## 2019-02-24 NOTE — Telephone Encounter (Signed)
Looks complete Thx

## 2019-02-24 NOTE — Telephone Encounter (Signed)
Copied from [place] [phone]. Topic: General - Inquiry >> Feb 23, 2019  3:09 PM Rayann Heman wrote: Reason for CRM: pt would like to schedule labs a week ahead and do labs prior to physical on 03/16/19. Please advise

## 2019-02-24 NOTE — Telephone Encounter (Signed)
Any other labs you want to add to my pended ones?

## 2019-02-27 ENCOUNTER — Other Ambulatory Visit: Payer: Self-pay | Admitting: *Deleted

## 2019-03-02 MED ORDER — CIPROFLOXACIN HCL 500 MG PO TABS: 500.0000 mg | ORAL_TABLET | Freq: Two times a day (BID) | ORAL | 0 refills | Status: DC

## 2019-03-05 ENCOUNTER — Other Ambulatory Visit (INDEPENDENT_AMBULATORY_CARE_PROVIDER_SITE_OTHER): Payer: Medicare Other

## 2019-03-05 DIAGNOSIS — Z0000 Encounter for general adult medical examination without abnormal findings: Secondary | ICD-10-CM | POA: Diagnosis not present

## 2019-03-05 LAB — CBC WITH DIFFERENTIAL/PLATELET
Basophils Absolute: 0 10*3/uL (ref 0.0–0.1)
Basophils Relative: 0.8 % (ref 0.0–3.0)
Eosinophils Absolute: 0.1 10*3/uL (ref 0.0–0.7)
Eosinophils Relative: 2.4 % (ref 0.0–5.0)
HCT: 46.1 % (ref 39.0–52.0)
Hemoglobin: 15.8 g/dL (ref 13.0–17.0)
Lymphocytes Relative: 31 % (ref 12.0–46.0)
Lymphs Abs: 1.9 10*3/uL (ref 0.7–4.0)
MCHC: 34.3 g/dL (ref 30.0–36.0)
MCV: 96.8 fl (ref 78.0–100.0)
Monocytes Absolute: 0.6 10*3/uL (ref 0.1–1.0)
Monocytes Relative: 10 % (ref 3.0–12.0)
Neutro Abs: 3.4 10*3/uL (ref 1.4–7.7)
Neutrophils Relative %: 55.8 % (ref 43.0–77.0)
Platelets: 172 10*3/uL (ref 150.0–400.0)
RBC: 4.76 Mil/uL (ref 4.22–5.81)
RDW: 12.7 % (ref 11.5–15.5)
WBC: 6.2 10*3/uL (ref 4.0–10.5)

## 2019-03-05 LAB — URINALYSIS, ROUTINE W REFLEX MICROSCOPIC
Bilirubin Urine: NEGATIVE
Hgb urine dipstick: NEGATIVE
Ketones, ur: NEGATIVE
Leukocytes,Ua: NEGATIVE
Nitrite: NEGATIVE
RBC / HPF: NONE SEEN (ref 0–?)
Specific Gravity, Urine: 1.015 (ref 1.000–1.030)
Urine Glucose: >=1000 — AB
Urobilinogen, UA: 0.2 (ref 0.0–1.0)
pH: 5.5 (ref 5.0–8.0)

## 2019-03-05 LAB — BASIC METABOLIC PANEL
BUN: 15 mg/dL (ref 6–23)
CO2: 29 mEq/L (ref 19–32)
Calcium: 9.7 mg/dL (ref 8.4–10.5)
Chloride: 102 mEq/L (ref 96–112)
Creatinine, Ser: 1.21 mg/dL (ref 0.40–1.50)
GFR: 59.57 mL/min — ABNORMAL LOW (ref >60.00)
Glucose, Bld: 137 mg/dL — ABNORMAL HIGH (ref 70–99)
Potassium: 3.8 mEq/L (ref 3.5–5.1)
Sodium: 142 mEq/L (ref 135–145)

## 2019-03-05 LAB — LDL CHOLESTEROL, DIRECT: Direct LDL: 71 mg/dL

## 2019-03-05 LAB — HEPATIC FUNCTION PANEL
ALT: 18 U/L (ref 0–53)
AST: 16 U/L (ref 0–37)
Albumin: 4.3 g/dL (ref 3.5–5.2)
Alkaline Phosphatase: 49 U/L (ref 39–117)
Bilirubin, Direct: 0.2 mg/dL (ref 0.0–0.3)
Total Bilirubin: 1 mg/dL (ref 0.2–1.2)
Total Protein: 6.5 g/dL (ref 6.0–8.3)

## 2019-03-05 LAB — LIPID PANEL
Cholesterol: 117 mg/dL (ref 0–200)
HDL: 39.1 mg/dL (ref >39.00)
LDL Cholesterol: 58 mg/dL (ref 0–99)
NonHDL: 77.53
Total CHOL/HDL Ratio: 3
Triglycerides: 100 mg/dL (ref 0.0–149.0)
VLDL: 20 mg/dL (ref 0.0–40.0)

## 2019-03-05 LAB — HEMOGLOBIN A1C: Hgb A1c MFr Bld: 6.5 % (ref 4.6–6.5)

## 2019-03-05 LAB — PSA: PSA: 1.93 ng/mL (ref 0.10–4.00)

## 2019-03-05 LAB — TSH: TSH: 2.8 u[IU]/mL (ref 0.35–4.50)

## 2019-03-16 ENCOUNTER — Other Ambulatory Visit: Payer: Self-pay

## 2019-03-16 ENCOUNTER — Ambulatory Visit (INDEPENDENT_AMBULATORY_CARE_PROVIDER_SITE_OTHER): Payer: Medicare Other | Admitting: Internal Medicine

## 2019-03-16 ENCOUNTER — Encounter: Payer: Self-pay | Admitting: Internal Medicine

## 2019-03-16 VITALS — BP 132/82 | HR 69 | Temp 99.3°F | Ht 73.0 in | Wt 197.0 lb

## 2019-03-16 DIAGNOSIS — Z0000 Encounter for general adult medical examination without abnormal findings: Secondary | ICD-10-CM | POA: Diagnosis not present

## 2019-03-16 DIAGNOSIS — Z23 Encounter for immunization: Secondary | ICD-10-CM

## 2019-03-16 DIAGNOSIS — E1169 Type 2 diabetes mellitus with other specified complication: Secondary | ICD-10-CM | POA: Diagnosis not present

## 2019-03-16 DIAGNOSIS — E669 Obesity, unspecified: Secondary | ICD-10-CM | POA: Diagnosis not present

## 2019-03-16 DIAGNOSIS — E785 Hyperlipidemia, unspecified: Secondary | ICD-10-CM

## 2019-03-16 DIAGNOSIS — I2510 Atherosclerotic heart disease of native coronary artery without angina pectoris: Secondary | ICD-10-CM

## 2019-03-16 DIAGNOSIS — Z951 Presence of aortocoronary bypass graft: Secondary | ICD-10-CM

## 2019-03-16 MED ORDER — INDOMETHACIN ER 75 MG PO CPCR: ORAL_CAPSULE | ORAL | 1 refills | Status: DC

## 2019-03-16 MED ORDER — NITROGLYCERIN 0.4 MG SL SUBL: SUBLINGUAL_TABLET | SUBLINGUAL | 1 refills | Status: DC

## 2019-03-16 MED ORDER — METRONIDAZOLE 250 MG PO TABS: 250.0000 mg | ORAL_TABLET | Freq: Three times a day (TID) | ORAL | 0 refills | Status: DC

## 2019-03-16 MED ORDER — NIACIN ER (ANTIHYPERLIPIDEMIC) 500 MG PO TBCR: 500.0000 mg | EXTENDED_RELEASE_TABLET | Freq: Every day | ORAL | 3 refills | Status: DC

## 2019-03-16 MED ORDER — GLIPIZIDE ER 5 MG PO TB24: ORAL_TABLET | ORAL | 3 refills | Status: DC

## 2019-03-16 MED ORDER — ATORVASTATIN CALCIUM 40 MG PO TABS: 40.0000 mg | ORAL_TABLET | Freq: Two times a day (BID) | ORAL | 3 refills | Status: DC

## 2019-03-16 MED ORDER — JARDIANCE 10 MG PO TABS: 10.0000 mg | ORAL_TABLET | Freq: Every day | ORAL | 3 refills | Status: DC

## 2019-03-16 MED ORDER — CIPROFLOXACIN HCL 500 MG PO TABS: 500.0000 mg | ORAL_TABLET | Freq: Two times a day (BID) | ORAL | 0 refills | Status: DC

## 2019-03-16 MED ORDER — SILODOSIN 8 MG PO CAPS: 8.0000 mg | ORAL_CAPSULE | Freq: Every day | ORAL | 3 refills | Status: DC

## 2019-03-16 MED ORDER — BUPROPION HCL ER (XL) 150 MG PO TB24: 150.0000 mg | ORAL_TABLET | Freq: Every day | ORAL | 3 refills | Status: DC

## 2019-03-16 MED ORDER — TRIAMCINOLONE ACETONIDE 0.5 % EX OINT: 1.0000 "application " | TOPICAL_OINTMENT | Freq: Four times a day (QID) | CUTANEOUS | 0 refills | Status: DC

## 2019-03-16 MED ORDER — TADALAFIL 5 MG PO TABS: ORAL_TABLET | ORAL | 11 refills | Status: DC

## 2019-03-16 MED ORDER — ATOVAQUONE-PROGUANIL HCL 250-100 MG PO TABS: ORAL_TABLET | ORAL | 4 refills | Status: DC

## 2019-03-16 MED ORDER — ACCU-CHEK FASTCLIX LANCETS MISC: 3 refills | Status: DC

## 2019-03-16 MED ORDER — METFORMIN HCL 1000 MG PO TABS: ORAL_TABLET | ORAL | 3 refills | Status: DC

## 2019-03-16 MED ORDER — ENALAPRIL MALEATE 5 MG PO TABS: ORAL_TABLET | ORAL | 3 refills | Status: DC

## 2019-03-16 MED ORDER — MECLIZINE HCL 12.5 MG PO TABS: 12.5000 mg | ORAL_TABLET | Freq: Three times a day (TID) | ORAL | 1 refills | Status: DC | PRN

## 2019-03-16 MED ORDER — VALACYCLOVIR HCL 500 MG PO TABS: 500.0000 mg | ORAL_TABLET | Freq: Three times a day (TID) | ORAL | 3 refills | Status: DC

## 2019-03-16 NOTE — Assessment & Plan Note (Signed)
We discussed age appropriate health related issues, including available/recomended screening tests and vaccinations. We discussed a need for adhering to healthy diet and exercise. Labs were ordered to be later reviewed . All questions were answered.   
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
## 2019-03-16 NOTE — Assessment & Plan Note (Signed)
ASA, Lipitor No angina

## 2019-03-16 NOTE — Assessment & Plan Note (Signed)
Lipitor 

## 2019-03-16 NOTE — Patient Instructions (Signed)

## 2019-03-16 NOTE — Assessment & Plan Note (Signed)
Glipizide, Metformin [name] [name]

## 2019-03-17 MED ORDER — ACCU-CHEK GUIDE VI STRP: ORAL_STRIP | 3 refills | Status: DC

## 2019-03-17 MED ORDER — ONETOUCH DELICA PLUS LANCET33G MISC: 1.0000 | Freq: Every day | 3 refills | Status: DC

## 2019-03-17 MED ORDER — ATORVASTATIN CALCIUM 80 MG PO TABS: 80.0000 mg | ORAL_TABLET | Freq: Every day | ORAL | 3 refills | Status: DC

## 2019-03-17 NOTE — Addendum Note (Signed)
Addended by: Karren Cobble on: 03/17/2019 02:42 PM   Modules accepted: Orders

## 2019-03-19 NOTE — Addendum Note (Signed)
Addended by: Karren Cobble on: 03/19/2019 10:58 AM   Modules accepted: Orders

## 2019-05-05 ENCOUNTER — Other Ambulatory Visit: Payer: Self-pay | Admitting: Internal Medicine

## 2019-05-07 ENCOUNTER — Other Ambulatory Visit: Payer: Self-pay | Admitting: Internal Medicine

## 2019-08-28 ENCOUNTER — Ambulatory Visit: Payer: Medicare Other | Attending: Internal Medicine

## 2019-08-28 DIAGNOSIS — Z23 Encounter for immunization: Secondary | ICD-10-CM | POA: Insufficient documentation

## 2019-08-28 NOTE — Progress Notes (Signed)
   [id number]  Name:  [name] Mcclaran Gritton Jr.    MRN: [id number] DOB: 04-14-1951  08/28/2019  [name] [name] was observed post Covid-19 immunization for 15 minutes without incidence. He was provided with Vaccine Information Sheet and instruction to access the V-Safe system.   [name] [name] was instructed to call 911 with any severe reactions post vaccine: Marland Kitchen Difficulty breathing  . Swelling of your face and throat  . A fast heartbeat  . A bad rash all over your body  . Dizziness and weakness    Immunizations Administered    Name Date Dose VIS Date Route   Pfizer COVID-19 Vaccine 08/28/2019  8:16 AM 0.3 mL 06/12/2019 Intramuscular   Manufacturer: [place]   Lot: [id number]   [place]: [id number]

## 2019-09-22 ENCOUNTER — Ambulatory Visit: Payer: Medicare Other | Attending: Internal Medicine

## 2019-09-22 DIAGNOSIS — Z23 Encounter for immunization: Secondary | ICD-10-CM

## 2019-09-22 NOTE — Progress Notes (Signed)
   [id number] [facility]  Name:  [name] Chesbro Hum Jr.    MRN: [id number] DOB: 1951-01-13  09/22/2019  [name] was observed post Covid-19 immunization for 15 minutes without incident. He was provided with Vaccine Information Sheet and instruction to access the V-Safe system.   [name] was instructed to call 911 with any severe reactions post vaccine: Marland Kitchen Difficulty breathing  . Swelling of face and throat  . A fast heartbeat  . A bad rash all over body  . Dizziness and weakness   Immunizations Administered    Name Date Dose VIS Date Route   Pfizer COVID-19 Vaccine 09/22/2019  9:13 AM 0.3 mL 06/12/2019 Intramuscular   Manufacturer: [place]   Lot: [id number]   [place]: [id number]

## 2019-10-30 ENCOUNTER — Other Ambulatory Visit: Payer: Self-pay | Admitting: Internal Medicine

## 2019-12-09 ENCOUNTER — Encounter: Payer: Self-pay | Admitting: Cardiology

## 2019-12-30 ENCOUNTER — Other Ambulatory Visit (INDEPENDENT_AMBULATORY_CARE_PROVIDER_SITE_OTHER): Payer: Medicare Other

## 2019-12-30 ENCOUNTER — Other Ambulatory Visit: Payer: Medicare Other

## 2019-12-30 DIAGNOSIS — E119 Type 2 diabetes mellitus without complications: Secondary | ICD-10-CM

## 2019-12-30 LAB — HEMOGLOBIN A1C: Hgb A1c MFr Bld: 5.8 % (ref 4.6–6.5)

## 2019-12-30 LAB — BASIC METABOLIC PANEL
BUN: 20 mg/dL (ref 6–23)
CO2: 28 mEq/L (ref 19–32)
Calcium: 9.8 mg/dL (ref 8.4–10.5)
Chloride: 103 mEq/L (ref 96–112)
Creatinine, Ser: 1.18 mg/dL (ref 0.40–1.50)
GFR: 61.17 mL/min (ref >60.00)
Glucose, Bld: 107 mg/dL — ABNORMAL HIGH (ref 70–99)
Potassium: 3.9 mEq/L (ref 3.5–5.1)
Sodium: 140 mEq/L (ref 135–145)

## 2020-01-06 ENCOUNTER — Other Ambulatory Visit: Payer: Self-pay

## 2020-01-06 ENCOUNTER — Ambulatory Visit (INDEPENDENT_AMBULATORY_CARE_PROVIDER_SITE_OTHER): Payer: Medicare Other | Admitting: Internal Medicine

## 2020-01-06 ENCOUNTER — Encounter: Payer: Self-pay | Admitting: Internal Medicine

## 2020-01-06 VITALS — BP 130/68 | HR 72 | Temp 98.3°F | Wt 193.4 lb

## 2020-01-06 DIAGNOSIS — Z0000 Encounter for general adult medical examination without abnormal findings: Secondary | ICD-10-CM | POA: Diagnosis not present

## 2020-01-06 DIAGNOSIS — E1169 Type 2 diabetes mellitus with other specified complication: Secondary | ICD-10-CM

## 2020-01-06 DIAGNOSIS — I2510 Atherosclerotic heart disease of native coronary artery without angina pectoris: Secondary | ICD-10-CM

## 2020-01-06 DIAGNOSIS — E663 Overweight: Secondary | ICD-10-CM

## 2020-01-06 DIAGNOSIS — E785 Hyperlipidemia, unspecified: Secondary | ICD-10-CM | POA: Diagnosis not present

## 2020-01-06 DIAGNOSIS — E669 Obesity, unspecified: Secondary | ICD-10-CM

## 2020-01-06 NOTE — Assessment & Plan Note (Addendum)
Reduce Glipizide if low CBGs   Metformin Jardiance
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
## 2020-01-06 NOTE — Assessment & Plan Note (Signed)
Lipitor, ASA 

## 2020-01-06 NOTE — Assessment & Plan Note (Signed)
On Lipitor 

## 2020-01-06 NOTE — Patient Instructions (Addendum)
Sign up for [employer] ( via [employer] on your phone or your ipad). If you don't have a Art [employer] card  - go to [employer] branch. They will set you up in 15 minutes. It is free. You can check out books to read and to listen, check out magazines and newspapers, movies etc.  Reduce Glipizide if low sugars

## 2020-01-06 NOTE — Assessment & Plan Note (Signed)
Much better - lost wt

## 2020-01-25 ENCOUNTER — Other Ambulatory Visit: Payer: Self-pay | Admitting: Internal Medicine

## 2020-02-05 ENCOUNTER — Encounter: Payer: Self-pay | Admitting: Cardiology

## 2020-02-05 ENCOUNTER — Ambulatory Visit: Payer: Medicare Other | Admitting: Cardiology

## 2020-02-05 ENCOUNTER — Other Ambulatory Visit: Payer: Self-pay

## 2020-02-05 VITALS — BP 124/50 | HR 79 | Ht 73.0 in | Wt 195.0 lb

## 2020-02-05 DIAGNOSIS — E119 Type 2 diabetes mellitus without complications: Secondary | ICD-10-CM | POA: Diagnosis not present

## 2020-02-05 DIAGNOSIS — E785 Hyperlipidemia, unspecified: Secondary | ICD-10-CM | POA: Diagnosis not present

## 2020-02-05 DIAGNOSIS — I2510 Atherosclerotic heart disease of native coronary artery without angina pectoris: Secondary | ICD-10-CM

## 2020-02-05 DIAGNOSIS — I10 Essential (primary) hypertension: Secondary | ICD-10-CM | POA: Diagnosis not present
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
## 2020-02-05 NOTE — Patient Instructions (Addendum)
Medication Instructions:  The current medical regimen is effective;  continue present plan and medications.  *If you need a refill on your cardiac medications before your next appointment, please call your pharmacy*  Follow-Up: At [facility], you and your health needs are our priority.  As part of our continuing mission to provide you with exceptional heart care, we have created designated Provider Care Teams.  These Care Teams include your primary Cardiologist (physician) and Advanced Practice Providers (APPs -  Physician Assistants and Nurse Practitioners) who all work together to provide you with the care you need, when you need it.  We recommend signing up for the patient portal called "MyChart".  Sign up information is provided on this After Visit Summary.  MyChart is used to connect with patients for Virtual Visits (Telemedicine).  Patients are able to view lab/test results, encounter notes, upcoming appointments, etc.  Non-urgent messages can be sent to your provider as well.   To learn more about what you can do with MyChart, go to [email or web address].    Your next appointment:   12 month(s)  The format for your next appointment:   In Person  Provider:   Mark Skains, MD   Thank you for choosing [facility]!!      

## 2020-04-13 ENCOUNTER — Other Ambulatory Visit (INDEPENDENT_AMBULATORY_CARE_PROVIDER_SITE_OTHER): Payer: Medicare Other

## 2020-04-13 DIAGNOSIS — E669 Obesity, unspecified: Secondary | ICD-10-CM

## 2020-04-13 DIAGNOSIS — E663 Overweight: Secondary | ICD-10-CM

## 2020-04-13 DIAGNOSIS — Z0000 Encounter for general adult medical examination without abnormal findings: Secondary | ICD-10-CM | POA: Diagnosis not present

## 2020-04-13 DIAGNOSIS — I2510 Atherosclerotic heart disease of native coronary artery without angina pectoris: Secondary | ICD-10-CM

## 2020-04-13 DIAGNOSIS — E785 Hyperlipidemia, unspecified: Secondary | ICD-10-CM

## 2020-04-13 DIAGNOSIS — E1169 Type 2 diabetes mellitus with other specified complication: Secondary | ICD-10-CM

## 2020-04-13 LAB — URINALYSIS
Bilirubin Urine: NEGATIVE
Hgb urine dipstick: NEGATIVE
Leukocytes,Ua: NEGATIVE
Nitrite: NEGATIVE
Specific Gravity, Urine: 1.025 (ref 1.000–1.030)
Urine Glucose: >=1000 — AB
Urobilinogen, UA: 0.2 (ref 0.0–1.0)
pH: 5.5 (ref 5.0–8.0)

## 2020-04-13 LAB — HEPATIC FUNCTION PANEL
ALT: 14 U/L (ref 0–53)
AST: 15 U/L (ref 0–37)
Albumin: 4.1 g/dL (ref 3.5–5.2)
Alkaline Phosphatase: 44 U/L (ref 39–117)
Bilirubin, Direct: 0.2 mg/dL (ref 0.0–0.3)
Total Bilirubin: 1.2 mg/dL (ref 0.2–1.2)
Total Protein: 6.2 g/dL (ref 6.0–8.3)

## 2020-04-13 LAB — CBC WITH DIFFERENTIAL/PLATELET
Basophils Absolute: 0 10*3/uL (ref 0.0–0.1)
Basophils Relative: 0.5 % (ref 0.0–3.0)
Eosinophils Absolute: 0.2 10*3/uL (ref 0.0–0.7)
Eosinophils Relative: 2.9 % (ref 0.0–5.0)
HCT: 44.9 % (ref 39.0–52.0)
Hemoglobin: 15.4 g/dL (ref 13.0–17.0)
Lymphocytes Relative: 37.1 % (ref 12.0–46.0)
Lymphs Abs: 2.5 10*3/uL (ref 0.7–4.0)
MCHC: 34.3 g/dL (ref 30.0–36.0)
MCV: 97.2 fl (ref 78.0–100.0)
Monocytes Absolute: 0.6 10*3/uL (ref 0.1–1.0)
Monocytes Relative: 9.4 % (ref 3.0–12.0)
Neutro Abs: 3.4 10*3/uL (ref 1.4–7.7)
Neutrophils Relative %: 50.1 % (ref 43.0–77.0)
Platelets: 177 10*3/uL (ref 150.0–400.0)
RBC: 4.62 Mil/uL (ref 4.22–5.81)
RDW: 12.4 % (ref 11.5–15.5)
WBC: 6.8 10*3/uL (ref 4.0–10.5)

## 2020-04-13 LAB — TSH: TSH: 3.47 u[IU]/mL (ref 0.35–4.50)

## 2020-04-13 LAB — MICROALBUMIN / CREATININE URINE RATIO
Creatinine,U: 117 mg/dL
Microalb Creat Ratio: 10.2 mg/g (ref 0.0–30.0)
Microalb, Ur: 11.9 mg/dL — ABNORMAL HIGH (ref 0.0–1.9)

## 2020-04-13 LAB — LIPID PANEL
Cholesterol: 109 mg/dL (ref 0–200)
HDL: 35.4 mg/dL — ABNORMAL LOW (ref >39.00)
LDL Cholesterol: 53 mg/dL (ref 0–99)
NonHDL: 73.98
Total CHOL/HDL Ratio: 3
Triglycerides: 103 mg/dL (ref 0.0–149.0)
VLDL: 20.6 mg/dL (ref 0.0–40.0)

## 2020-04-13 LAB — BASIC METABOLIC PANEL
BUN: 18 mg/dL (ref 6–23)
CO2: 28 mEq/L (ref 19–32)
Calcium: 9.4 mg/dL (ref 8.4–10.5)
Chloride: 102 mEq/L (ref 96–112)
Creatinine, Ser: 1.29 mg/dL (ref 0.40–1.50)
GFR: 56.01 mL/min — ABNORMAL LOW (ref >60.00)
Glucose, Bld: 118 mg/dL — ABNORMAL HIGH (ref 70–99)
Potassium: 4 mEq/L (ref 3.5–5.1)
Sodium: 141 mEq/L (ref 135–145)

## 2020-04-13 LAB — PSA: PSA: 0.68 ng/mL (ref 0.10–4.00)

## 2020-04-13 LAB — HEMOGLOBIN A1C: Hgb A1c MFr Bld: 6.3 % (ref 4.6–6.5)

## 2020-04-13 NOTE — Addendum Note (Signed)
Addended by: Boris Lown B on: 04/13/2020 07:32 AM   Modules accepted: Orders

## 2020-04-18 ENCOUNTER — Other Ambulatory Visit: Payer: Self-pay | Admitting: Internal Medicine

## 2020-04-18 ENCOUNTER — Telehealth: Payer: Self-pay | Admitting: Internal Medicine

## 2020-04-18 NOTE — Telephone Encounter (Signed)
LVM for pt to rtn my call to schedule AWV-I with [facility] 

## 2020-04-19 NOTE — Telephone Encounter (Signed)
Pt has cpx scheduled for tomorrow will hold to refill at ov.Marland KitchenJohny [name]

## 2020-04-20 ENCOUNTER — Encounter: Payer: Self-pay | Admitting: Internal Medicine

## 2020-04-20 ENCOUNTER — Ambulatory Visit (INDEPENDENT_AMBULATORY_CARE_PROVIDER_SITE_OTHER): Payer: Medicare Other | Admitting: Internal Medicine

## 2020-04-20 ENCOUNTER — Other Ambulatory Visit: Payer: Self-pay

## 2020-04-20 VITALS — BP 142/70 | HR 87 | Temp 98.4°F | Ht 73.0 in | Wt 193.0 lb

## 2020-04-20 DIAGNOSIS — E669 Obesity, unspecified: Secondary | ICD-10-CM

## 2020-04-20 DIAGNOSIS — Z23 Encounter for immunization: Secondary | ICD-10-CM

## 2020-04-20 DIAGNOSIS — M545 Low back pain, unspecified: Secondary | ICD-10-CM

## 2020-04-20 DIAGNOSIS — Z0000 Encounter for general adult medical examination without abnormal findings: Secondary | ICD-10-CM

## 2020-04-20 DIAGNOSIS — E1169 Type 2 diabetes mellitus with other specified complication: Secondary | ICD-10-CM

## 2020-04-20 DIAGNOSIS — I2510 Atherosclerotic heart disease of native coronary artery without angina pectoris: Secondary | ICD-10-CM

## 2020-04-20 DIAGNOSIS — E663 Overweight: Secondary | ICD-10-CM

## 2020-04-20 NOTE — Assessment & Plan Note (Signed)
Dr Tamala Julian Recurrent

## 2020-04-20 NOTE — Assessment & Plan Note (Signed)
Lipitor, ASA 
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
## 2020-04-20 NOTE — Assessment & Plan Note (Signed)

## 2020-04-20 NOTE — Assessment & Plan Note (Signed)
On Rx 

## 2020-04-20 NOTE — Assessment & Plan Note (Signed)
Wt Readings from Last 3 Encounters:  04/20/20 193 lb (87.5 kg)  02/05/20 195 lb (88.5 kg)  01/06/20 193 lb 6.4 oz (87.7 kg)

## 2020-04-22 ENCOUNTER — Ambulatory Visit (INDEPENDENT_AMBULATORY_CARE_PROVIDER_SITE_OTHER): Payer: Medicare Other

## 2020-04-22 DIAGNOSIS — Z0000 Encounter for general adult medical examination without abnormal findings: Secondary | ICD-10-CM | POA: Diagnosis not present

## 2020-04-22 NOTE — Progress Notes (Addendum)
I connected with [name] [name] today by telephone and verified that I am speaking with the correct person using two identifiers. Location patient: home Location provider: work Persons participating in the virtual visit: [name] [name] and [employer], LPN.   I discussed the limitations, risks, security and privacy concerns of performing an evaluation and management service by telephone and the availability of in person appointments. I also discussed with the patient that there may be a patient responsible charge related to this service. The patient expressed understanding and verbally consented to this telephonic visit.    Interactive audio and video telecommunications were attempted between this provider and patient, however failed, due to patient having technical difficulties OR patient did not have access to video capability.  We continued and completed visit with audio only.  Some vital signs may be absent or patient reported.   Time Spent with patient on telephone encounter: 20 minutes  Subjective:   [name] Sachs Abend [name] is a 69 y.o. male who presents for Medicare Annual/Subsequent preventive examination.  Review of Systems    No ROS. Medicare Wellness Visit. Cardiac Risk Factors include: advanced age (>1men, >51 women);diabetes mellitus;dyslipidemia;family history of premature cardiovascular disease;hypertension;microalbuminuria     Objective:    There were no vitals filed for this visit. There is no height or weight on file to calculate BMI.  Advanced Directives 04/22/2020 09/27/2015  Does Patient Have a Medical Advance Directive? Yes No  Does patient want to make changes to medical advance directive? No - Patient declined -    Current Medications (verified) Outpatient Encounter Medications as of 04/22/2020  Medication Sig   ACCU-CHEK GUIDE test strip USE TO TEST BLOOD SUGAR FIVE TIMES A DAY. DX: E11.69   aspirin 81 MG tablet Take 1 tablet (81 mg total) by mouth  daily.   atorvastatin (LIPITOR) 80 MG tablet TAKE 1 TABLET BY MOUTH EVERY DAY   atovaquone-proguanil (MALARONE) 250-100 MG TABS tablet TAKE AS DIRECTED   Blood Glucose Monitoring Suppl (ACCU-CHEK GUIDE ME) w/Device KIT 1 Units by Does not apply route 5 (five) times daily.   buPROPion (WELLBUTRIN XL) 150 MG 24 hr tablet TAKE 1 TABLET BY MOUTH EVERY DAY   Cholecalciferol 1000 UNITS tablet Take 1 tablet (1,000 Units total) by mouth daily.   Diclofenac Sodium (PENNSAID) 2 % SOLN Apply twice daily to affected area   diphenhydrAMINE (BENADRYL) 25 MG tablet Take 1-2 tablets (25-50 mg total) by mouth every 8 (eight) hours as needed for allergies.   enalapril (VASOTEC) 5 MG tablet TAKE 2 TABLETS BY MOUTH EVERY DAY.   glipiZIDE (GLUCOTROL XL) 5 MG 24 hr tablet TAKE 2 TABLETS BY MOUT IN AM OR ONE TWICE A DAY   ibuprofen (ADVIL) 600 MG tablet TAKE 1 TABLET (600 MG TOTAL) BY MOUTH EVERY 4 (FOUR) HOURS AS NEEDED. FOR PAIN   indomethacin (INDOCIN SR) 75 MG CR capsule TAKE 1 CAPSULE BY MOUTH TWICE A DAY WITH MEAL   JARDIANCE 10 MG TABS tablet TAKE 1 TABLET BY MOUTH EVERY DAY   meclizine (ANTIVERT) 12.5 MG tablet TAKE 1 TABLET (12.5 MG TOTAL) BY MOUTH 3 (THREE) TIMES DAILY AS NEEDED.   metFORMIN (GLUCOPHAGE) 1000 MG tablet TAKE 1 TABLET BY MOUTH TWICE A DAY WITH MEAL   metroNIDAZOLE (FLAGYL) 250 MG tablet TAKE 1 TABLET (250 MG TOTAL) BY MOUTH 3 (THREE) TIMES DAILY.   niacin (NIASPAN) 500 MG CR tablet TAKE 1 TABLET (500 MG TOTAL) BY MOUTH AT BEDTIME.   nitroGLYCERIN (NITROSTAT) 0.4 MG  SL tablet PLACE 1 TABLET UNDER THE TOUNGE EVERY 5 MINUTES AS NEEDED FOR CHEST PAIN   OMEGA 3-6-9 FATTY ACIDS PO Take 1 tablet by mouth daily.    OneTouch Delica Lancets 03B MISC USE TO HELP CHECK BLOOD SUGARS 5 TIMES A DAY   silodosin (RAPAFLO) 8 MG CAPS capsule TAKE 1 CAPSULE BY MOUTH EVERY DAY   tadalafil (CIALIS) 5 MG tablet TAKE ONE TABLET DAILY FOR BPH **FAXED MD FOR PRIOR AUTH 07/01/18**   triamcinolone ointment (KENALOG) 0.5  % APPLY 1 APPLICATION TOPICALLY 4 (FOUR) TIMES DAILY.   valACYclovir (VALTREX) 500 MG tablet TAKE 1 TABLET BY MOUTH THREE TIMES A DAY   No facility-administered encounter medications on file as of 04/22/2020.    Allergies (verified) Iohexol and Sulfadiazine   History: Past Medical History:  Diagnosis Date   Aortic root dilatation (HCC)    slight, echo 2009   Blood transfusion without reported diagnosis    CAD (coronary artery disease)    Nuclear, Jul 04, 2009 NO ischemia   Diabetes mellitus    Diverticulosis    Herpes simplex    Hx of CABG 04/2008   October, 2009   Hyperlipidemia    Low testosterone 2010   LOW DHEA   Microalbuminuria    Snoring    prior history of surgery for sleep apnea   Past Surgical History:  Procedure Laterality Date   APPENDECTOMY  1978   CHOLECYSTECTOMY  2009   CORONARY ARTERY BYPASS GRAFT  2009   Removal of UVULA     ENT   TONSILECTOMY, ADENOIDECTOMY, BILATERAL MYRINGOTOMY AND TUBES     Family History  Problem Relation Age of Onset   Diabetes Mother    Mental illness Mother 2       Alzheimer   Diabetes Father    Heart disease Father        CAD   Hypertension Unknown    Social History   Socioeconomic History   Marital status: Married    Spouse name: Not on file   Number of children: Not on file   Years of education: Not on file   Highest education level: Not on file  Occupational History   Not on file  Tobacco Use   Smoking status: Never Smoker   Smokeless tobacco: Never Used  Substance and Sexual Activity   Alcohol use: No    Alcohol/week: 0.0 standard drinks   Drug use: No   Sexual activity: Yes  Other Topics Concern   Not on file  Social History Narrative   Luz Lex a lot   Regular exercise - NO   Social Determinants of Health   Financial Resource Strain: Low Risk    Difficulty of Paying Living Expenses: Not hard at all  Food Insecurity: No Food Insecurity   Worried About Charity fundraiser in the Last Year: Never  true   [place] in the Last Year: Never true  Transportation Needs: No Transportation Needs   Lack of Transportation (Medical): No   Lack of Transportation (Non-Medical): No  Physical Activity: Sufficiently Active   Days of Exercise per Week: 5 days   Minutes of Exercise per Session: 30 min  Stress: No Stress Concern Present   Feeling of Stress : Not at all  Social Connections: Socially Integrated   Frequency of Communication with Friends and Family: More than three times a week   Frequency of Social Gatherings with Friends and Family: More than three times a week  Attends Religious Services: More than 4 times per year   Active Member of Clubs or Organizations: Not on file   Attends Archivist Meetings: More than 4 times per year   Marital Status: Married    Tobacco Counseling Counseling given: Not Answered   Clinical Intake:  Pre-visit preparation completed: Yes  Pain : No/denies pain     Nutritional Risks: None Diabetes: Yes CBG done?: No Did pt. bring in CBG monitor from home?: No  How often do you need to have someone help you when you read instructions, pamphlets, or other written materials from your doctor or pharmacy?: 1 - Never What is the last grade level you completed in school?: Graduate School  Diabetic? yes  Interpreter Needed?: No  Information entered by :: Reannon Candella N. Sophiarose Eades, LPN   Activities of Daily Living In your present state of health, do you have any difficulty performing the following activities: 04/22/2020 04/20/2020  Hearing? N N  Vision? N N  Difficulty concentrating or making decisions? N N  Walking or climbing stairs? N N  Dressing or bathing? N N  Doing errands, shopping? N N  Preparing Food and eating ? N -  Using the Toilet? N -  In the past six months, have you accidently leaked urine? N -  Do you have problems with loss of bowel control? N -  Managing your Medications? N -  Managing your Finances? N -    Housekeeping or managing your Housekeeping? N -  Some recent data might be hidden    Patient Care Team: Plotnikov, Evie Lacks, MD as PCP - General Jerline Pain, MD as PCP - Cardiology (Cardiology)  Indicate any recent Medical Services you may have received from other than [facility] providers in the past year (date may be approximate).     Assessment:   This is a routine wellness examination for Lejend.  Hearing/Vision screen No exam data present  Dietary issues and exercise activities discussed: Current Exercise Habits: Home exercise routine, Type of exercise: walking, Time (Minutes): 30, Frequency (Times/Week): 5, Weekly Exercise (Minutes/Week): 150, Intensity: Moderate, Exercise limited by: None identified  Goals   None    Depression Screen PHQ 2/9 Scores 04/22/2020 04/20/2020 03/16/2019 05/17/2017 01/09/2016 01/07/2015  PHQ - 2 Score 0 0 0 0 0 0    Fall Risk Fall Risk  04/22/2020 04/20/2020 03/16/2019 05/17/2017 01/09/2016  Falls in the past year? 0 0 0 No No  Number falls in past yr: 0 0 - - -  Injury with Fall? 0 0 - - -  Risk for fall due to : No Fall Risks - - - -  Follow up Falls evaluation completed - Falls evaluation completed - -    Any stairs in or around the home? No  If so, are there any without handrails? No  Home free of loose throw rugs in walkways, pet beds, electrical cords, etc? Yes  Adequate lighting in your home to reduce risk of falls? Yes   ASSISTIVE DEVICES UTILIZED TO PREVENT FALLS:  Life alert? No  Use of a cane, walker or w/c? No  Grab bars in the bathroom? No  Shower chair or bench in shower? No  Elevated toilet seat or a handicapped toilet? No   TIMED UP AND GO:  Was the test performed? No .  Length of time to ambulate 10 feet: 0 sec.   Gait steady and fast without use of assistive device  Cognitive Function: Patient is cogitatively intact.  Immunizations Immunization History  Administered Date(s) Administered   Fluad  Quad(high Dose 65+) 03/16/2019, 04/20/2020   H1N1 06/07/2008   Hep A / Hep B 12/13/2017, 01/14/2018, 06/17/2018   Hepatitis A 05/17/1999   Influenza Split 05/11/2011   Influenza Whole 05/06/2008, 04/01/2009, 03/30/2010, 04/02/2012   Influenza, Quadrivalent, Recombinant, Inj, Pf 03/25/2018   Influenza,inj,Quad PF,6+ Mos 03/10/2013, 05/20/2015   Influenza-Unspecified 03/31/2014, 03/28/2016, 03/27/2017   Meningococcal Mcv4o 12/13/2017   Meningococcal Polysaccharide 05/24/2010   PFIZER SARS-COV-2 Vaccination 08/28/2019, 09/22/2019   Pneumococcal Conjugate-13 06/16/2013   Pneumococcal Polysaccharide-23 08/03/2016   Td 05/24/2010   Tdap 06/17/2015   Zoster 03/10/2013   Zoster Recombinat (Shingrix) 11/05/2017, 01/05/2018    TDAP status: Up to date Flu Vaccine status: Up to date Pneumococcal vaccine status: Up to date Covid-19 vaccine status: Completed vaccines  Qualifies for Shingles Vaccine? Yes   Zostavax completed Yes   Shingrix Completed?: Yes  Screening Tests Health Maintenance  Topic Date Due   FOOT EXAM  Never done   OPHTHALMOLOGY EXAM  06/30/2014   COLONOSCOPY  09/26/2020   HEMOGLOBIN A1C  10/12/2020   TETANUS/TDAP  06/16/2025   INFLUENZA VACCINE  Completed   COVID-19 Vaccine  Completed   Hepatitis C Screening  Completed   PNA vac Low Risk Adult  Completed    Health Maintenance  Health Maintenance Due  Topic Date Due   FOOT EXAM  Never done   OPHTHALMOLOGY EXAM  06/30/2014    Colorectal cancer screening: Completed 09/27/2015. Repeat every 5 years  Lung Cancer Screening: (Low Dose CT Chest recommended if Age 18-80 years, 30 pack-year currently smoking OR have quit w/in 15years.) does not qualify.   Lung Cancer Screening Referral: no  Additional Screening:  Hepatitis C Screening: does qualify; Completed yes  Vision Screening: Recommended annual ophthalmology exams for early detection of glaucoma and other disorders of the eye. Is the patient up to date with  their annual eye exam?  Yes  Who is the provider or what is the name of the office in which the patient attends annual eye exams? [facility] If pt is not established with a provider, would they like to be referred to a provider to establish care? No .   Dental Screening: Recommended annual dental exams for proper oral hygiene  Community Resource Referral / Chronic Care Management: CRR required this visit?  No   CCM required this visit?  No      Plan:     I have personally reviewed and noted the following in the patient's chart:   Medical and social history Use of alcohol, tobacco or illicit drugs  Current medications and supplements Functional ability and status Nutritional status Physical activity Advanced directives List of other physicians Hospitalizations, surgeries, and ER visits in previous 12 months Vitals Screenings to include cognitive, depression, and falls Referrals and appointments  In addition, I have reviewed and discussed with patient certain preventive protocols, quality metrics, and best practice recommendations. A written personalized care plan for preventive services as well as general preventive health recommendations were provided to patient.     Sheral Flow, LPN   12/45/[id number]   Nurse Notes:  Patient is cogitatively intact. There were no vitals filed for this visit. There is no height or weight on file to calculate BMI. Patient stated that he has no issues with gait or balance; does not use any assistive devices.  Medical screening examination/treatment/procedure(s) were performed by non-physician practitioner and as supervising physician I was immediately available for  consultation/collaboration.  I agree with above. Lew Dawes, MD

## 2020-04-22 NOTE — Patient Instructions (Addendum)
Mr. Aure , Thank you for taking time to come for your Medicare Wellness Visit. I appreciate your ongoing commitment to your health goals. Please review the following plan we discussed and let me know if I can assist you in the future.   Screening recommendations/referrals: Colonoscopy: 09/27/2015; due every 5 years Recommended yearly ophthalmology/optometry visit for glaucoma screening and checkup Recommended yearly dental visit for hygiene and checkup  Vaccinations: Influenza vaccine: 04/20/2020 Pneumococcal vaccine: up to date Tdap vaccine: 06/17/2015 Shingles vaccine: up to date   Covid-19: up to date  Advanced directives: Please bring a copy of your health care power of attorney and living will to the office at your convenience.  Conditions/risks identified: Yes; Reviewed health maintenance screenings with patient today and relevant education, vaccines, and/or referrals were provided. Please continue to do your personal lifestyle choices by: daily care of teeth and gums, regular physical activity (goal should be 5 days a week for 30 minutes), eat a healthy diet, avoid tobacco and drug use, limiting any alcohol intake, taking a low-dose aspirin (if not allergic or have been advised by your provider otherwise) and taking vitamins and minerals as recommended by your provider. Continue doing brain stimulating activities (puzzles, reading, adult coloring books, staying active) to keep memory sharp. Continue to eat heart healthy diet (full of fruits, vegetables, whole grains, lean protein, water--limit salt, fat, and sugar intake) and increase physical activity as tolerated.  Next appointment: Please schedule your next Medicare Wellness Visit with your Nurse Health Advisor in 1 year by calling [phone].  Preventive Care 69 Years and Older, Male Preventive care refers to lifestyle choices and visits with your health care provider that can promote health and wellness. What does preventive  care include?  A yearly physical exam. This is also called an annual well check.  Dental exams once or twice a year.  Routine eye exams. Ask your health care provider how often you should have your eyes checked.  Personal lifestyle choices, including:  Daily care of your teeth and gums.  Regular physical activity.  Eating a healthy diet.  Avoiding tobacco and drug use.  Limiting alcohol use.  Practicing safe sex.  Taking low doses of aspirin every day.  Taking vitamin and mineral supplements as recommended by your health care provider. What happens during an annual well check? The services and screenings done by your health care provider during your annual well check will depend on your age, overall health, lifestyle risk factors, and family history of disease. Counseling  Your health care provider may ask you questions about your:  Alcohol use.  Tobacco use.  Drug use.  Emotional well-being.  Home and relationship well-being.  Sexual activity.  Eating habits.  History of falls.  Memory and ability to understand (cognition).  Work and work Statistician. Screening  You may have the following tests or measurements:  Height, weight, and BMI.  Blood pressure.  Lipid and cholesterol levels. These may be checked every 5 years, or more frequently if you are over 69 years old.  Skin check.  Lung cancer screening. You may have this screening every year starting at age 69 if you have a 30-pack-year history of smoking and currently smoke or have quit within the past 15 years.  Fecal occult blood test (FOBT) of the stool. You may have this test every year starting at age 69.  Flexible sigmoidoscopy or colonoscopy. You may have a sigmoidoscopy every 5 years or a colonoscopy every 10 years starting at age  69.  Prostate cancer screening. Recommendations will vary depending on your family history and other risks.  Hepatitis C blood test.  Hepatitis B blood  test.  Sexually transmitted disease (STD) testing.  Diabetes screening. This is done by checking your blood sugar (glucose) after you have not eaten for a while (fasting). You may have this done every 1-3 years.  Abdominal aortic aneurysm (AAA) screening. You may need this if you are a current or former smoker.  Osteoporosis. You may be screened starting at age 55 if you are at high risk. Talk with your health care provider about your test results, treatment options, and if necessary, the need for more tests. Vaccines  Your health care provider may recommend certain vaccines, such as:  Influenza vaccine. This is recommended every year.  Tetanus, diphtheria, and acellular pertussis (Tdap, Td) vaccine. You may need a Td booster every 10 years.  Zoster vaccine. You may need this after age 69.  Pneumococcal 13-valent conjugate (PCV13) vaccine. One dose is recommended after age 69.  Pneumococcal polysaccharide (PPSV23) vaccine. One dose is recommended after age 69. Talk to your health care provider about which screenings and vaccines you need and how often you need them. This information is not intended to replace advice given to you by your health care provider. Make sure you discuss any questions you have with your health care provider. Document Released: 07/15/2015 Document Revised: 03/07/2016 Document Reviewed: 04/19/2015 Elsevier Interactive Patient Education  2017 [place] Prevention in the Home Falls can cause injuries. They can happen to people of all ages. There are many things you can do to make your home safe and to help prevent falls. What can I do on the outside of my home?  Regularly fix the edges of walkways and driveways and fix any cracks.  Remove anything that might make you trip as you walk through a door, such as a raised step or threshold.  Trim any bushes or trees on the path to your home.  Use bright outdoor lighting.  Clear any walking paths of  anything that might make someone trip, such as rocks or tools.  Regularly check to see if handrails are loose or broken. Make sure that both sides of any steps have handrails.  Any raised decks and porches should have guardrails on the edges.  Have any leaves, snow, or ice cleared regularly.  Use sand or salt on walking paths during winter.  Clean up any spills in your garage right away. This includes oil or grease spills. What can I do in the bathroom?  Use night lights.  Install grab bars by the toilet and in the tub and shower. Do not use towel bars as grab bars.  Use non-skid mats or decals in the tub or shower.  If you need to sit down in the shower, use a plastic, non-slip stool.  Keep the floor dry. Clean up any water that spills on the floor as soon as it happens.  Remove soap buildup in the tub or shower regularly.  Attach bath mats securely with double-sided non-slip rug tape.  Do not have throw rugs and other things on the floor that can make you trip. What can I do in the bedroom?  Use night lights.  Make sure that you have a light by your bed that is easy to reach.  Do not use any sheets or blankets that are too big for your bed. They should not hang down onto the floor.  Have a firm chair that has side arms. You can use this for support while you get dressed.  Do not have throw rugs and other things on the floor that can make you trip. What can I do in the kitchen?  Clean up any spills right away.  Avoid walking on wet floors.  Keep items that you use a lot in easy-to-reach places.  If you need to reach something above you, use a strong step stool that has a grab bar.  Keep electrical cords out of the way.  Do not use floor polish or wax that makes floors slippery. If you must use wax, use non-skid floor wax.  Do not have throw rugs and other things on the floor that can make you trip. What can I do with my stairs?  Do not leave any items on the  stairs.  Make sure that there are handrails on both sides of the stairs and use them. Fix handrails that are broken or loose. Make sure that handrails are as long as the stairways.  Check any carpeting to make sure that it is firmly attached to the stairs. Fix any carpet that is loose or worn.  Avoid having throw rugs at the top or bottom of the stairs. If you do have throw rugs, attach them to the floor with carpet tape.  Make sure that you have a light switch at the top of the stairs and the bottom of the stairs. If you do not have them, ask someone to add them for you. What else can I do to help prevent falls?  Wear shoes that:  Do not have high heels.  Have rubber bottoms.  Are comfortable and fit you well.  Are closed at the toe. Do not wear sandals.  If you use a stepladder:  Make sure that it is fully opened. Do not climb a closed stepladder.  Make sure that both sides of the stepladder are locked into place.  Ask someone to hold it for you, if possible.  Clearly mark and make sure that you can see:  Any grab bars or handrails.  First and last steps.  Where the edge of each step is.  Use tools that help you move around (mobility aids) if they are needed. These include:  Canes.  Walkers.  Scooters.  Crutches.  Turn on the lights when you go into a dark area. Replace any light bulbs as soon as they burn out.  Set up your furniture so you have a clear path. Avoid moving your furniture around.  If any of your floors are uneven, fix them.  If there are any pets around you, be aware of where they are.  Review your medicines with your doctor. Some medicines can make you feel dizzy. This can increase your chance of falling. Ask your doctor what other things that you can do to help prevent falls. This information is not intended to replace advice given to you by your health care provider. Make sure you discuss any questions you have with your health care  provider. Document Released: 04/14/2009 Document Revised: 11/24/2015 Document Reviewed: 07/23/2014 Elsevier Interactive Patient Education  2017 [employer].

## 2020-04-25 ENCOUNTER — Encounter: Payer: Self-pay | Admitting: Internal Medicine

## 2020-07-03 ENCOUNTER — Other Ambulatory Visit: Payer: Self-pay | Admitting: Internal Medicine

## 2020-08-16 ENCOUNTER — Other Ambulatory Visit (INDEPENDENT_AMBULATORY_CARE_PROVIDER_SITE_OTHER): Payer: Medicare Other

## 2020-08-16 DIAGNOSIS — E119 Type 2 diabetes mellitus without complications: Secondary | ICD-10-CM | POA: Diagnosis not present

## 2020-08-16 LAB — BASIC METABOLIC PANEL
BUN: 18 mg/dL (ref 6–23)
CO2: 31 mEq/L (ref 19–32)
Calcium: 9.5 mg/dL (ref 8.4–10.5)
Chloride: 103 mEq/L (ref 96–112)
Creatinine, Ser: 1.25 mg/dL (ref 0.40–1.50)
GFR: 58.63 mL/min — ABNORMAL LOW (ref >60.00)
Glucose, Bld: 128 mg/dL — ABNORMAL HIGH (ref 70–99)
Potassium: 3.8 mEq/L (ref 3.5–5.1)
Sodium: 141 mEq/L (ref 135–145)

## 2020-08-16 LAB — HEMOGLOBIN A1C: Hgb A1c MFr Bld: 6 % (ref 4.6–6.5)

## 2020-08-19 ENCOUNTER — Other Ambulatory Visit: Payer: Self-pay

## 2020-08-22 ENCOUNTER — Encounter: Payer: Self-pay | Admitting: Internal Medicine

## 2020-08-22 ENCOUNTER — Ambulatory Visit (INDEPENDENT_AMBULATORY_CARE_PROVIDER_SITE_OTHER): Payer: Medicare Other | Admitting: Internal Medicine

## 2020-08-22 ENCOUNTER — Other Ambulatory Visit: Payer: Self-pay

## 2020-08-22 DIAGNOSIS — E1169 Type 2 diabetes mellitus with other specified complication: Secondary | ICD-10-CM | POA: Diagnosis not present

## 2020-08-22 DIAGNOSIS — E785 Hyperlipidemia, unspecified: Secondary | ICD-10-CM

## 2020-08-22 DIAGNOSIS — I152 Hypertension secondary to endocrine disorders: Secondary | ICD-10-CM

## 2020-08-22 DIAGNOSIS — I2510 Atherosclerotic heart disease of native coronary artery without angina pectoris: Secondary | ICD-10-CM | POA: Diagnosis not present

## 2020-08-22 DIAGNOSIS — E1159 Type 2 diabetes mellitus with other circulatory complications: Secondary | ICD-10-CM | POA: Diagnosis not present

## 2020-08-22 DIAGNOSIS — R251 Tremor, unspecified: Secondary | ICD-10-CM | POA: Insufficient documentation

## 2020-08-22 DIAGNOSIS — Z951 Presence of aortocoronary bypass graft: Secondary | ICD-10-CM

## 2020-08-22 DIAGNOSIS — G250 Essential tremor: Secondary | ICD-10-CM | POA: Insufficient documentation

## 2020-08-22 DIAGNOSIS — E669 Obesity, unspecified: Secondary | ICD-10-CM

## 2020-08-22 NOTE — Assessment & Plan Note (Signed)
On Lipitor 

## 2020-08-22 NOTE — Assessment & Plan Note (Addendum)
Glipizide, Metformin Jardiance  Labs  A1c

## 2020-08-22 NOTE — Assessment & Plan Note (Signed)
R hand>L  Pt declined treatment, Neurol ref

## 2020-08-22 NOTE — Assessment & Plan Note (Signed)
No angina 

## 2020-08-22 NOTE — Assessment & Plan Note (Signed)
Enalapril

## 2020-08-22 NOTE — Assessment & Plan Note (Signed)
Lipitor, ASA
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
## 2020-09-16 ENCOUNTER — Encounter: Payer: Self-pay | Admitting: Internal Medicine

## 2020-10-12 ENCOUNTER — Other Ambulatory Visit: Payer: Self-pay | Admitting: Internal Medicine

## 2020-11-16 ENCOUNTER — Other Ambulatory Visit (INDEPENDENT_AMBULATORY_CARE_PROVIDER_SITE_OTHER): Payer: Medicare Other

## 2020-11-16 DIAGNOSIS — E119 Type 2 diabetes mellitus without complications: Secondary | ICD-10-CM | POA: Diagnosis not present

## 2020-11-16 LAB — BASIC METABOLIC PANEL
BUN: 21 mg/dL (ref 6–23)
CO2: 27 mEq/L (ref 19–32)
Calcium: 9.5 mg/dL (ref 8.4–10.5)
Chloride: 104 mEq/L (ref 96–112)
Creatinine, Ser: 1.16 mg/dL (ref 0.40–1.50)
GFR: 64.02 mL/min (ref >60.00)
Glucose, Bld: 115 mg/dL — ABNORMAL HIGH (ref 70–99)
Potassium: 3.9 mEq/L (ref 3.5–5.1)
Sodium: 141 mEq/L (ref 135–145)

## 2020-11-16 LAB — HEMOGLOBIN A1C: Hgb A1c MFr Bld: 6.3 % (ref 4.6–6.5)

## 2020-11-18 ENCOUNTER — Other Ambulatory Visit: Payer: Self-pay

## 2020-11-21 ENCOUNTER — Ambulatory Visit (INDEPENDENT_AMBULATORY_CARE_PROVIDER_SITE_OTHER): Payer: Medicare Other

## 2020-11-21 ENCOUNTER — Other Ambulatory Visit: Payer: Self-pay

## 2020-11-21 ENCOUNTER — Ambulatory Visit (INDEPENDENT_AMBULATORY_CARE_PROVIDER_SITE_OTHER): Payer: Medicare Other | Admitting: Internal Medicine

## 2020-11-21 ENCOUNTER — Encounter: Payer: Self-pay | Admitting: Internal Medicine

## 2020-11-21 DIAGNOSIS — E1159 Type 2 diabetes mellitus with other circulatory complications: Secondary | ICD-10-CM

## 2020-11-21 DIAGNOSIS — M25552 Pain in left hip: Secondary | ICD-10-CM

## 2020-11-21 DIAGNOSIS — R944 Abnormal results of kidney function studies: Secondary | ICD-10-CM | POA: Diagnosis not present

## 2020-11-21 DIAGNOSIS — E669 Obesity, unspecified: Secondary | ICD-10-CM

## 2020-11-21 DIAGNOSIS — E1169 Type 2 diabetes mellitus with other specified complication: Secondary | ICD-10-CM | POA: Diagnosis not present

## 2020-11-21 DIAGNOSIS — E785 Hyperlipidemia, unspecified: Secondary | ICD-10-CM

## 2020-11-21 DIAGNOSIS — I152 Hypertension secondary to endocrine disorders: Secondary | ICD-10-CM

## 2020-11-21 DIAGNOSIS — I2510 Atherosclerotic heart disease of native coronary artery without angina pectoris: Secondary | ICD-10-CM

## 2020-11-21 DIAGNOSIS — R251 Tremor, unspecified: Secondary | ICD-10-CM

## 2020-11-21 MED ORDER — LORAZEPAM 0.5 MG PO TABS: 0.5000 mg | ORAL_TABLET | Freq: Two times a day (BID) | ORAL | 1 refills | Status: DC | PRN

## 2020-11-21 NOTE — Assessment & Plan Note (Signed)
Probable OA Hip X ray Tylenol prn Ortho ref offered

## 2020-11-21 NOTE — Assessment & Plan Note (Signed)
Lipitor, ASA 
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
## 2020-11-21 NOTE — Assessment & Plan Note (Signed)
On Lipitor 

## 2020-11-21 NOTE — Assessment & Plan Note (Signed)
Glipizide, Metformin ?

## 2020-11-21 NOTE — Assessment & Plan Note (Signed)
Will try Lorazepam

## 2020-11-21 NOTE — Assessment & Plan Note (Signed)
Renal US Hydrate well No NSAIDs

## 2020-11-21 NOTE — Assessment & Plan Note (Signed)
On Enalapril 

## 2020-11-23 ENCOUNTER — Encounter: Payer: Self-pay | Admitting: Internal Medicine

## 2020-12-07 ENCOUNTER — Ambulatory Visit
Admission: RE | Admit: 2020-12-07 | Discharge: 2020-12-07 | Disposition: A | Discharge status: Home / Self Care | Payer: Medicare Other | Source: Ambulatory Visit | Attending: Internal Medicine | Admitting: Internal Medicine

## 2020-12-07 DIAGNOSIS — R944 Abnormal results of kidney function studies: Secondary | ICD-10-CM

## 2021-01-04 ENCOUNTER — Other Ambulatory Visit: Payer: Self-pay | Admitting: Internal Medicine

## 2021-01-07 ENCOUNTER — Other Ambulatory Visit: Payer: Self-pay | Admitting: Internal Medicine

## 2021-02-10 ENCOUNTER — Other Ambulatory Visit: Payer: Self-pay

## 2021-02-10 ENCOUNTER — Ambulatory Visit (AMBULATORY_SURGERY_CENTER): Payer: Self-pay | Admitting: *Deleted

## 2021-02-10 VITALS — Ht 73.0 in | Wt 198.0 lb

## 2021-02-10 DIAGNOSIS — Z86010 Personal history of colonic polyps: Secondary | ICD-10-CM

## 2021-02-10 MED ORDER — NA SULFATE-K SULFATE-MG SULF 17.5-3.13-1.6 GM/177ML PO SOLN: 1.0000 | Freq: Once | ORAL | 0 refills | Status: AC

## 2021-02-10 MED ORDER — SUTAB 1479-225-188 MG PO TABS: 24.0000 | ORAL_TABLET | ORAL | 0 refills | Status: DC

## 2021-02-10 NOTE — Progress Notes (Signed)
No egg or soy allergy known to patient  No issues with past sedation with any surgeries or procedures Patient denies ever being told they had issues or difficulty with intubation  No FH of Malignant Hyperthermia No diet pills per patient No home 02 use per patient  No blood thinners per patient  Pt denies issues with constipation  No A fib or A flutter  EMMI video to pt or via [place] 19 guidelines implemented in [place] today with Pt and RN  Pt is fully vaccinated  for Covid   SUTAB  Coupon given to pt in PV today , Code to Pharmacy and  NO PA's for preps discussed with pt In PV today  Discussed with pt there will be an out-of-pocket cost for prep and that varies from $0 to 70 dollars - PT ASKED FOR PRINTED PREP PRESCRIPTIONS SO HE CAN SHOP AROUND FOR THE CHEAPEST PRICE- I GAVE HIM A SUTAB COUPON TO MAKE THAT $40- WE WENT OVER BOTH PREPS IN PV TODAY AND PT WILL DECIDE ONCE HE CAN GET PRICES   Due to the COVID-19 pandemic we are asking patients to follow certain guidelines.  Pt aware of COVID protocols and [facility] guidelines

## 2021-02-15 ENCOUNTER — Other Ambulatory Visit (INDEPENDENT_AMBULATORY_CARE_PROVIDER_SITE_OTHER): Payer: Medicare Other

## 2021-02-15 DIAGNOSIS — E119 Type 2 diabetes mellitus without complications: Secondary | ICD-10-CM | POA: Diagnosis not present

## 2021-02-15 LAB — BASIC METABOLIC PANEL
BUN: 18 mg/dL (ref 6–23)
CO2: 28 mEq/L (ref 19–32)
Calcium: 9.4 mg/dL (ref 8.4–10.5)
Chloride: 102 mEq/L (ref 96–112)
Creatinine, Ser: 1.38 mg/dL (ref 0.40–1.50)
GFR: 51.88 mL/min — ABNORMAL LOW (ref >60.00)
Glucose, Bld: 150 mg/dL — ABNORMAL HIGH (ref 70–99)
Potassium: 3.9 mEq/L (ref 3.5–5.1)
Sodium: 139 mEq/L (ref 135–145)

## 2021-02-15 LAB — HEMOGLOBIN A1C: Hgb A1c MFr Bld: 6.4 % (ref 4.6–6.5)

## 2021-02-21 ENCOUNTER — Encounter: Payer: Self-pay | Admitting: Internal Medicine

## 2021-02-22 ENCOUNTER — Encounter: Payer: Self-pay | Admitting: Internal Medicine

## 2021-02-22 ENCOUNTER — Ambulatory Visit (INDEPENDENT_AMBULATORY_CARE_PROVIDER_SITE_OTHER): Payer: Medicare Other | Admitting: Internal Medicine

## 2021-02-22 ENCOUNTER — Other Ambulatory Visit: Payer: Self-pay

## 2021-02-22 ENCOUNTER — Telehealth: Payer: Self-pay | Admitting: Internal Medicine

## 2021-02-22 DIAGNOSIS — F419 Anxiety disorder, unspecified: Secondary | ICD-10-CM | POA: Diagnosis not present

## 2021-02-22 DIAGNOSIS — R944 Abnormal results of kidney function studies: Secondary | ICD-10-CM | POA: Diagnosis not present

## 2021-02-22 DIAGNOSIS — K219 Gastro-esophageal reflux disease without esophagitis: Secondary | ICD-10-CM | POA: Diagnosis not present

## 2021-02-22 DIAGNOSIS — E669 Obesity, unspecified: Secondary | ICD-10-CM

## 2021-02-22 DIAGNOSIS — E1169 Type 2 diabetes mellitus with other specified complication: Secondary | ICD-10-CM

## 2021-02-22 MED ORDER — PROPRANOLOL HCL 10 MG PO TABS: 10.0000 mg | ORAL_TABLET | Freq: Every day | ORAL | 3 refills | Status: DC | PRN

## 2021-02-22 NOTE — Telephone Encounter (Signed)
Pharmacy called regarding the Propranolol HCl  Wanted to verify PCP is aware of patient's history of eczema. The medication might cause complications with the eczema.    Requesting a callback to [phone]

## 2021-02-22 NOTE — Assessment & Plan Note (Signed)
Cont w/Glipizide, Metformin, Jardiance  Monitor A1c

## 2021-02-22 NOTE — Assessment & Plan Note (Addendum)
Stage anxiety - not better Will increase Lorazepam to 1 mg prn 2 h prior Try Propranolol 10-20 mg prn

## 2021-02-22 NOTE — Assessment & Plan Note (Signed)
Off Rx - on diet

## 2021-02-22 NOTE — Assessment & Plan Note (Signed)
We will cont to monitor GFR

## 2021-02-22 NOTE — Progress Notes (Signed)
Subjective:  Patient ID: [name] Sachs Dech [name] [name]., male    DOB: 01-30-1951  Age: 70 y.o. MRN: [id number]  CC: Follow-up (3 month f/u)   HPI [name] [name]. presents for DM, HTN, dyslipidemia f/u. C/o stage anxiety... [name] [name] continues to have a cough  Outpatient Medications Prior to Visit  Medication Sig Dispense Refill   ACCU-CHEK GUIDE test strip USE TO TEST BLOOD SUGAR FIVE TIMES A DAY. DX: E11.69 450 strip 2   aspirin 81 MG tablet Take 1 tablet (81 mg total) by mouth daily.     atorvastatin (LIPITOR) 80 MG tablet TAKE 1 TABLET BY MOUTH EVERY DAY 90 tablet 2   Blood Glucose Monitoring Suppl (ACCU-CHEK GUIDE ME) w/Device KIT 1 Units by Does not apply route 5 (five) times daily. 1 kit 1   buPROPion (WELLBUTRIN XL) 150 MG 24 hr tablet TAKE 1 TABLET BY MOUTH EVERY DAY 90 tablet 2   Cholecalciferol 1000 UNITS tablet Take 1 tablet (1,000 Units total) by mouth daily. 200 tablet 3   diphenhydrAMINE (BENADRYL) 25 MG tablet Take 1-2 tablets (25-50 mg total) by mouth every 8 (eight) hours as needed for allergies. 60 tablet 5   enalapril (VASOTEC) 5 MG tablet TAKE 2 TABLETS BY MOUTH EVERY DAY. 180 tablet 2   glipiZIDE (GLUCOTROL XL) 5 MG 24 hr tablet TAKE 2 TABLETS BY MOUT IN AM OR ONE TWICE A DAY 180 tablet 2   ibuprofen (ADVIL) 600 MG tablet TAKE 1 TABLET (600 MG TOTAL) BY MOUTH EVERY 4 (FOUR) HOURS AS NEEDED. FOR PAIN 30 tablet 0   JARDIANCE 10 MG TABS tablet TAKE 1 TABLET BY MOUTH EVERY DAY 90 tablet 2   LORazepam (ATIVAN) 0.5 MG tablet Take 1 tablet (0.5 mg total) by mouth 2 (two) times daily as needed for anxiety (tremot). 60 tablet 1   meclizine (ANTIVERT) 12.5 MG tablet TAKE 1 TABLET (12.5 MG TOTAL) BY MOUTH 3 (THREE) TIMES DAILY AS NEEDED. 90 tablet 1   metFORMIN (GLUCOPHAGE) 1000 MG tablet TAKE 1 TABLET BY MOUTH TWICE A DAY WITH MEAL 180 tablet 2   niacin (NIASPAN) 500 MG CR tablet TAKE 1 TABLET BY MOUTH AT BEDTIME. 90 tablet 3   nitroGLYCERIN (NITROSTAT) 0.4 MG SL tablet PLACE 1  TABLET UNDER THE TOUNGE EVERY 5 MINUTES AS NEEDED FOR CHEST PAIN 25 tablet 1   OMEGA 3-6-9 FATTY ACIDS PO Take 1 tablet by mouth daily.     OneTouch Delica Lancets 81E MISC USE TO HELP CHECK BLOOD SUGARS 5 TIMES A DAY 500 each 3   silodosin (RAPAFLO) 8 MG CAPS capsule TAKE 1 CAPSULE BY MOUTH EVERY DAY 90 capsule 2   tadalafil (CIALIS) 5 MG tablet TAKE ONE TABLET DAILY FOR BPH 90 tablet 2   valACYclovir (VALTREX) 500 MG tablet TAKE 1 TABLET BY MOUTH THREE TIMES A DAY 21 tablet 2   indomethacin (INDOCIN SR) 75 MG CR capsule TAKE 1 CAPSULE BY MOUTH TWICE A DAY WITH MEAL (Patient not taking: No sig reported) 60 capsule 1   atovaquone-proguanil (MALARONE) 250-100 MG TABS tablet TAKE AS DIRECTED (Patient not taking: No sig reported) 60 tablet 4   Diclofenac Sodium (PENNSAID) 2 % SOLN Apply twice daily to affected area (Patient not taking: No sig reported) 1 Bottle 2   Sodium Sulfate-Mag Sulfate-KCl (SUTAB) [phone] MG TABS Take 24 tablets by mouth as directed. (Patient not taking: Reported on 02/22/2021) 24 tablet 0   No facility-administered medications prior to visit.    ROS: Review  of Systems  Constitutional:  Negative for appetite change, fatigue and unexpected weight change.  HENT:  Negative for congestion, nosebleeds, sneezing, sore throat and trouble swallowing.   Eyes:  Negative for itching and visual disturbance.  Respiratory:  Negative for cough.   Cardiovascular:  Negative for chest pain, palpitations and leg swelling.  Gastrointestinal:  Negative for abdominal distention, blood in stool, diarrhea and nausea.  Genitourinary:  Negative for frequency and hematuria.  Musculoskeletal:  Negative for back pain, gait problem, joint swelling and neck pain.  Skin:  Negative for rash.  Neurological:  Negative for dizziness, tremors, speech difficulty and weakness.  Psychiatric/Behavioral:  Negative for agitation, dysphoric mood and sleep disturbance. The patient is not nervous/anxious.     Objective:  BP 132/68 (BP Location: Left Arm)   Pulse 66   Temp 98.4 F (36.9 C) (Oral)   Ht $R'6\' 1"'oB$  (1.854 m)   Wt 197 lb 6.4 oz (89.5 kg)   SpO2 97%   BMI 26.04 kg/m   BP Readings from Last 3 Encounters:  02/22/21 132/68  11/21/20 130/72  08/22/20 138/70    Wt Readings from Last 3 Encounters:  02/22/21 197 lb 6.4 oz (89.5 kg)  02/10/21 198 lb (89.8 kg)  11/21/20 202 lb (91.6 kg)    Physical Exam Constitutional:      General: He is not in acute distress.    Appearance: He is well-developed.     Comments: NAD  Eyes:     Conjunctiva/sclera: Conjunctivae normal.     Pupils: Pupils are equal, round, and reactive to light.  Neck:     Thyroid: No thyromegaly.     Vascular: No JVD.  Cardiovascular:     Rate and Rhythm: Normal rate and regular rhythm.     Heart sounds: Normal heart sounds. No murmur heard.   No friction rub. No gallop.  Pulmonary:     Effort: Pulmonary effort is normal. No respiratory distress.     Breath sounds: Normal breath sounds. No wheezing or rales.  Chest:     Chest wall: No tenderness.  Abdominal:     General: Bowel sounds are normal. There is no distension.     Palpations: Abdomen is soft. There is no mass.     Tenderness: There is no abdominal tenderness. There is no guarding or rebound.  Musculoskeletal:        General: No tenderness. Normal range of motion.     Cervical back: Normal range of motion.  Lymphadenopathy:     Cervical: No cervical adenopathy.  Skin:    General: Skin is warm and dry.     Findings: No rash.  Neurological:     Mental Status: He is alert and oriented to person, place, and time.     Cranial Nerves: No cranial nerve deficit.     Motor: No abnormal muscle tone.     Coordination: Coordination normal.     Gait: Gait normal.     Deep Tendon Reflexes: Reflexes are normal and symmetric.  Psychiatric:        Behavior: Behavior normal.        Thought Content: Thought content normal.        Judgment: Judgment  normal.    Lab Results  Component Value Date   WBC 6.8 04/13/2020   HGB 15.4 04/13/2020   HCT 44.9 04/13/2020   PLT 177.0 04/13/2020   GLUCOSE 150 (H) 02/15/2021   CHOL 109 04/13/2020   TRIG 103.0 04/13/2020   HDL  35.40 (L) 04/13/2020   LDLDIRECT 71.0 03/05/2019   LDLCALC 53 04/13/2020   ALT 14 04/13/2020   AST 15 04/13/2020   NA 139 02/15/2021   K 3.9 02/15/2021   CL 102 02/15/2021   CREATININE 1.38 02/15/2021   BUN 18 02/15/2021   CO2 28 02/15/2021   TSH 3.47 04/13/2020   PSA 0.68 04/13/2020   INR 1.3 04/08/2008   HGBA1C 6.4 02/15/2021   MICROALBUR 11.9 (H) 04/13/2020    US RENAL  Result Date: 12/09/2020 CLINICAL DATA:  Decreased GFR. EXAM: RENAL / URINARY TRACT ULTRASOUND COMPLETE COMPARISON:  None. FINDINGS: Right Kidney: Renal measurements: 12.0 x 5.3 x 5.1 cm = volume: 168 mL. Echogenicity within normal limits. No mass or hydronephrosis visualized. Left Kidney: Renal measurements: 12.3 x 5.5 x 4.8 cm = volume: 169 mL. Echogenicity within normal limits. No mass or hydronephrosis visualized. Bladder: Appears normal for degree of bladder distention. Bilateral ureteral jets are noted. Other: Mild prostatic enlargement is noted. IMPRESSION: Kidneys are unremarkable.  Mild prostatic enlargement. Electronically Signed   By: Marijo Conception M.D.   On: 12/09/2020 15:00    Assessment & Plan:    Walker Kehr, MD

## 2021-02-23 NOTE — Telephone Encounter (Signed)
Yes. Thx.

## 2021-02-24 ENCOUNTER — Encounter: Payer: Self-pay | Admitting: Internal Medicine

## 2021-02-24 ENCOUNTER — Other Ambulatory Visit: Payer: Self-pay

## 2021-02-24 ENCOUNTER — Ambulatory Visit (AMBULATORY_SURGERY_CENTER): Payer: Medicare Other | Admitting: Internal Medicine

## 2021-02-24 VITALS — BP 116/62 | HR 53 | Temp 98.0°F | Resp 10 | Ht 73.0 in | Wt 198.0 lb

## 2021-02-24 DIAGNOSIS — Z86010 Personal history of colonic polyps: Secondary | ICD-10-CM

## 2021-02-24 DIAGNOSIS — D122 Benign neoplasm of ascending colon: Secondary | ICD-10-CM

## 2021-02-24 MED ORDER — SODIUM CHLORIDE 0.9 % IV SOLN: 500.0000 mL | INTRAVENOUS | Status: DC

## 2021-02-24 MED ORDER — PROPRANOLOL HCL 10 MG PO TABS: 10.0000 mg | ORAL_TABLET | Freq: Every day | ORAL | 3 refills | Status: DC | PRN

## 2021-02-24 NOTE — Progress Notes (Signed)
Called to room to assist during endoscopic procedure.  Patient ID and intended procedure confirmed with present staff. Received instructions for my participation in the procedure from the performing physician.  

## 2021-02-24 NOTE — Progress Notes (Signed)
Vs CW I have reviewed the patient's medical history in detail and updated the computerized patient record.   

## 2021-02-24 NOTE — Telephone Encounter (Signed)
Tried calling pharmacy was put on hold. Resent rx w/ MD response. Ok to refill.[name] KitchenJohny Chess

## 2021-02-24 NOTE — Progress Notes (Signed)
PT taken to PACU. Monitors in place. VSS. Report given to RN. 

## 2021-02-24 NOTE — Op Note (Signed)
[facility] Patient Name: [name] [name] Procedure Date: 02/24/2021 3:30 PM MRN: [id number] Endoscopist: Docia Chuck. Henrene Pastor , MD Age: 70 Referring MD:  Date of Birth: December 26, 1950 Gender: Male Account #: [id number] Procedure:                Colonoscopy with cold snare polypectomy x 1 Indications:              High risk colon cancer surveillance: Personal                            history of non-advanced adenoma. Previous                            examinations 1997, 2002, 2007, 2017 Medicines:                Monitored Anesthesia Care Procedure:                Pre-Anesthesia Assessment:                           - Prior to the procedure, a History and Physical                            was performed, and patient medications and                            allergies were reviewed. The patient's tolerance of                            previous anesthesia was also reviewed. The risks                            and benefits of the procedure and the sedation                            options and risks were discussed with the patient.                            All questions were answered, and informed consent                            was obtained. Prior Anticoagulants: The patient has                            taken no previous anticoagulant or antiplatelet                            agents. ASA Grade Assessment: II - A patient with                            mild systemic disease. After reviewing the risks                            and benefits, the patient was deemed in  satisfactory condition to undergo the procedure.                           After obtaining informed consent, the colonoscope                            was passed under direct vision. Throughout the                            procedure, the patient's blood pressure, pulse, and                            oxygen saturations were monitored continuously. The                            Olympus  [id number]-HQ190L [id number] was introduced through                            the anus and advanced to the the cecum, identified                            by appendiceal orifice and ileocecal valve. The                            ileocecal valve, appendiceal orifice, and rectum                            were photographed. The quality of the bowel                            preparation was excellent. The colonoscopy was                            performed without difficulty. The patient tolerated                            the procedure well. The bowel preparation used was                            SUPREP via split dose instruction. Scope In: 3:37:32 PM Scope Out: 3:53:40 PM Scope Withdrawal Time: 0 hours 13 minutes 17 seconds  Total Procedure Duration: 0 hours 16 minutes 8 seconds  Findings:                 A 4 mm polyp was found in the ascending colon. The                            polyp was sessile. The polyp was removed with a                            cold snare. Resection and retrieval were complete.                           Diverticula were found in the  left colon. Internal                            hemorrhoids present.                           The exam was otherwise without abnormality on                            direct and retroflexion views. Complications:            No immediate complications. Estimated blood loss:                            None. Estimated Blood Loss:     Estimated blood loss: none. Impression:               - One 4 mm polyp in the ascending colon, removed                            with a cold snare. Resected and retrieved.                           - Diverticulosis in the left colon. Internal                            hemorrhoids.                           - The examination was otherwise normal on direct                            and retroflexion views. Recommendation:           - Repeat colonoscopy in 7 years for surveillance.                            - Patient has a contact number available for                            emergencies. The signs and symptoms of potential                            delayed complications were discussed with the                            patient. Return to normal activities tomorrow.                            Written discharge instructions were provided to the                            patient.                           - Resume previous diet.                           -  Continue present medications.                           - Await pathology results. Docia Chuck. Henrene Pastor, MD 02/24/2021 4:01:22 PM This report has been signed electronically.

## 2021-02-24 NOTE — Patient Instructions (Signed)
Handouts given for polyps and diverticulosis.  YOU HAD AN ENDOSCOPIC PROCEDURE TODAY AT [facility]:   Refer to the procedure report that was given to you for any specific questions about what was found during the examination.  If the procedure report does not answer your questions, please call your gastroenterologist to clarify.  If you requested that your care partner not be given the details of your procedure findings, then the procedure report has been included in a sealed envelope for you to review at your convenience later.  YOU SHOULD EXPECT: Some feelings of bloating in the abdomen. Passage of more gas than usual.  Walking can help get rid of the air that was put into your GI tract during the procedure and reduce the bloating. If you had a lower endoscopy (such as a colonoscopy or flexible sigmoidoscopy) you may notice spotting of blood in your stool or on the toilet paper. If you underwent a bowel prep for your procedure, you may not have a normal bowel movement for a few days.  Please Note:  You might notice some irritation and congestion in your nose or some drainage.  This is from the oxygen used during your procedure.  There is no need for concern and it should clear up in a day or so.  SYMPTOMS TO REPORT IMMEDIATELY:  Following lower endoscopy (colonoscopy or flexible sigmoidoscopy):  Excessive amounts of blood in the stool  Significant tenderness or worsening of abdominal pains  Swelling of the abdomen that is new, acute  Fever of 100F or higher  For urgent or emergent issues, a gastroenterologist can be reached at any hour by calling [phone]. Do not use MyChart messaging for urgent concerns.    DIET:  We do recommend a small meal at first, but then you may proceed to your regular diet.  Drink plenty of fluids but you should avoid alcoholic beverages for 24 hours.  ACTIVITY:  You should plan to take it easy for the rest of today and you should NOT DRIVE,  NO WORK or use heavy machinery until tomorrow (because of the sedation medicines used during the test).    FOLLOW UP: Our staff will call the number listed on your records Tuesday around 715-8am following your procedure to check on you and address any questions or concerns that you may have regarding the information given to you following your procedure. If we do not reach you, we will leave a message.  We will attempt to reach you two times.  During this call, we will ask if you have developed any symptoms of COVID 19. If you develop any symptoms (ie: fever, flu-like symptoms, shortness of breath, cough etc.) before then, please call [phone].  If you test positive for Covid 19 in the 2 weeks post procedure, please call and report this information to [place].    If any biopsies were taken you will be contacted by phone or by letter within the next 1-3 weeks.  Please call us at [phone] if you have not heard about the biopsies in 3 weeks.    SIGNATURES/CONFIDENTIALITY: You and/or your care partner have signed paperwork which will be entered into your electronic medical record.  These signatures attest to the fact that that the information above on your After Visit Summary has been reviewed and is understood.  Full responsibility of the confidentiality of this discharge information lies with you and/or your care-partner.

## 2021-02-28 ENCOUNTER — Telehealth: Payer: Self-pay | Admitting: *Deleted

## 2021-02-28 NOTE — Telephone Encounter (Signed)
No answer for post procedure follow up.

## 2021-03-03 ENCOUNTER — Encounter: Payer: Self-pay | Admitting: Internal Medicine

## 2021-04-01 ENCOUNTER — Other Ambulatory Visit: Payer: Self-pay | Admitting: Internal Medicine

## 2021-05-08 ENCOUNTER — Ambulatory Visit (INDEPENDENT_AMBULATORY_CARE_PROVIDER_SITE_OTHER): Payer: Medicare Other | Admitting: Cardiology

## 2021-05-08 ENCOUNTER — Encounter: Payer: Self-pay | Admitting: Cardiology

## 2021-05-08 ENCOUNTER — Other Ambulatory Visit: Payer: Self-pay

## 2021-05-08 VITALS — BP 120/64 | HR 65 | Ht 73.0 in | Wt 198.0 lb

## 2021-05-08 DIAGNOSIS — E119 Type 2 diabetes mellitus without complications: Secondary | ICD-10-CM

## 2021-05-08 DIAGNOSIS — R079 Chest pain, unspecified: Secondary | ICD-10-CM

## 2021-05-08 DIAGNOSIS — E1169 Type 2 diabetes mellitus with other specified complication: Secondary | ICD-10-CM

## 2021-05-08 DIAGNOSIS — I2510 Atherosclerotic heart disease of native coronary artery without angina pectoris: Secondary | ICD-10-CM | POA: Diagnosis not present

## 2021-05-08 DIAGNOSIS — E782 Mixed hyperlipidemia: Secondary | ICD-10-CM

## 2021-05-08 DIAGNOSIS — G250 Essential tremor: Secondary | ICD-10-CM | POA: Diagnosis not present

## 2021-05-08 DIAGNOSIS — E669 Obesity, unspecified: Secondary | ICD-10-CM

## 2021-05-08 MED ORDER — ASPIRIN EC 81 MG PO TBEC: 81.0000 mg | DELAYED_RELEASE_TABLET | Freq: Every day | ORAL | 3 refills | Status: AC

## 2021-05-08 NOTE — Assessment & Plan Note (Signed)
Happy that he is taking Jardiance in the setting of cardiovascular disease.  Hemoglobin A1c of 6.4.  Dr. Alain Marion.

## 2021-05-08 NOTE — Assessment & Plan Note (Signed)
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  This helps him out quite significantly.  He also has low-dose lorazepam as well.  Did not mention this.  He does have a mild first-degree AV block noted on ECG.  I am comfortable with him however taking his as needed propranolol.

## 2021-05-08 NOTE — Assessment & Plan Note (Signed)
Mild pain.  Sometimes in his system and gets up in the middle of the night for the bathroom.  He is not noticing this during exertional activity during the day.  Keep an eye on this.  Prior stress test unremarkable.  Continue with goal-directed medical therapy.

## 2021-05-08 NOTE — Progress Notes (Signed)
Cardiology Office Note:    Date:  05/08/2021   ID:  [name] [name]., DOB Nov 15, 1950, MRN [id number]  PCP:  Tresa Garter, MD  [facility] Cardiologist:  Donato Schultz, MD  [facility] Electrophysiologist:  None   Referring MD: Tresa Garter, MD    History of Present Illness:    [name]. is a 70 y.o. male here for follow-up of coronary artery disease, diabetes, hypertension, status post CABG in 2009.  Had normal EF, normal nuclear stress test in both 2011 and 2017.  No ischemia.  Back in 2017 had an episode of going to the bathroom at night getting back in bed and catching his breath, bending over with dyspnea.  Had chest pain that was rare off and on and had a stress test that was reassuring.  Prior to CABG had a nuclear stress test that was abnormal when he had colicky type pain from his gallbladder.  Had a silent MI.  He reports that occurred after post hole digging.  Felt very short winded.  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 At night when get back in bed mild left sided CP a few seconds,   Today, he has been feeling alright. Recently, he has needed to take the propanolol on Sunday mornings. He works as a Retail banker and the medication helps manage his tremors which allows him to play and conduct.   He also reports occasional chest pain which he describes an ache. He feels it most prominently in the middle of the night when he has to go to the bathroom.   He denies any palpitations, or shortness of breath. No lightheadedness, headaches, syncope, orthopnea, PND, lower extremity edema or exertional symptoms.   Past Medical History:  Diagnosis Date   Aortic root dilatation (HCC)    slight, echo 2009   Arthritis    WRISTS, HIP   Blood transfusion without reported diagnosis    CAD (coronary artery disease)    Nuclear, Jul 04, 2009 NO ischemia   Cataract    Diabetes mellitus     Diverticulosis    Heart murmur    Herpes simplex    Hx of CABG 04/01/2008   October, 2009   Hyperlipidemia    Hypertension    Kidney cysts    ~09   Low testosterone 07/02/2008   LOW DHEA   Microalbuminuria    Snoring    prior history of surgery for sleep apnea    Past Surgical History:  Procedure Laterality Date   APPENDECTOMY  07/02/1976   CHOLECYSTECTOMY  07/03/2007   COLONOSCOPY     CORONARY ARTERY BYPASS GRAFT  07/03/2007   POLYPECTOMY     Removal of UVULA     ENT   TONSILECTOMY, ADENOIDECTOMY, BILATERAL MYRINGOTOMY AND TUBES     NO PE TUBES PER PT - NOSE SHAVED AS WELL    Current Medications: Current Meds  Medication Sig   ACCU-CHEK GUIDE test strip USE TO TEST BLOOD SUGAR FIVE TIMES A DAY. DX: E11.69   aspirin EC 81 MG tablet Take 1 tablet (81 mg total) by mouth daily. Swallow whole.   atorvastatin (LIPITOR) 80 MG tablet TAKE 1 TABLET BY MOUTH EVERY DAY   Blood Glucose Monitoring Suppl (ACCU-CHEK GUIDE ME) w/Device KIT 1 Units by Does not apply route 5 (five) times daily.   buPROPion (WELLBUTRIN XL) 150 MG 24 hr tablet TAKE 1 TABLET BY MOUTH EVERY DAY  Cholecalciferol 1000 UNITS tablet Take 1 tablet (1,000 Units total) by mouth daily.   diphenhydrAMINE (BENADRYL) 25 MG tablet Take 1-2 tablets (25-50 mg total) by mouth every 8 (eight) hours as needed for allergies.   enalapril (VASOTEC) 5 MG tablet TAKE 2 TABLETS BY MOUTH EVERY DAY.   glipiZIDE (GLUCOTROL XL) 5 MG 24 hr tablet TAKE 2 TABLETS BY MOUT IN AM OR ONE TWICE A DAY   ibuprofen (ADVIL) 600 MG tablet TAKE 1 TABLET (600 MG TOTAL) BY MOUTH EVERY 4 (FOUR) HOURS AS NEEDED. FOR PAIN   indomethacin (INDOCIN SR) 75 MG CR capsule TAKE 1 CAPSULE BY MOUTH TWICE A DAY WITH MEAL   JARDIANCE 10 MG TABS tablet TAKE 1 TABLET BY MOUTH EVERY DAY   Lancets (ONETOUCH DELICA PLUS [id number]) MISC USE TO HELP CHECK BLOOD SUGARS 5 TIMES A DAY   LORazepam (ATIVAN) 0.5 MG tablet Take 1 tablet (0.5 mg total) by mouth 2 (two) times  daily as needed for anxiety (tremot).   meclizine (ANTIVERT) 12.5 MG tablet TAKE 1 TABLET (12.5 MG TOTAL) BY MOUTH 3 (THREE) TIMES DAILY AS NEEDED.   metFORMIN (GLUCOPHAGE) 1000 MG tablet TAKE 1 TABLET BY MOUTH TWICE A DAY WITH MEAL   niacin (NIASPAN) 500 MG CR tablet TAKE 1 TABLET BY MOUTH AT BEDTIME.   nitroGLYCERIN (NITROSTAT) 0.4 MG SL tablet PLACE 1 TABLET UNDER THE TOUNGE EVERY 5 MINUTES AS NEEDED FOR CHEST PAIN   OMEGA 3-6-9 FATTY ACIDS PO Take 1 tablet by mouth daily.   propranolol (INDERAL) 10 MG tablet Take 1-2 tablets (10-20 mg total) by mouth daily as needed.   silodosin (RAPAFLO) 8 MG CAPS capsule TAKE 1 CAPSULE BY MOUTH EVERY DAY   tadalafil (CIALIS) 5 MG tablet TAKE ONE TABLET DAILY FOR BPH   valACYclovir (VALTREX) 500 MG tablet TAKE 1 TABLET BY MOUTH THREE TIMES A DAY   [DISCONTINUED] aspirin 325 MG tablet Take 325 mg by mouth daily.     Allergies:   Iohexol and Sulfadiazine   Social History   Socioeconomic History   Marital status: Married    Spouse name: Not on file   Number of children: Not on file   Years of education: Not on file   Highest education level: Not on file  Occupational History   Not on file  Tobacco Use   Smoking status: Never   Smokeless tobacco: Never  Substance and Sexual Activity   Alcohol use: No    Alcohol/week: 0.0 standard drinks   Drug use: No   Sexual activity: Yes  Other Topics Concern   Not on file  Social History Narrative   Luz Lex a lot   Regular exercise - NO   Social Determinants of Health   Financial Resource Strain: Not on file  Food Insecurity: Not on file  Transportation Needs: Not on file  Physical Activity: Not on file  Stress: Not on file  Social Connections: Not on file     Family History: The patient's family history includes Diabetes in his father and mother; Heart disease in his father; Hypertension in an other family member; Mental illness (age of onset: 47) in his mother. There is no history of Colon  cancer, Colon polyps, Esophageal cancer, Rectal cancer, or Stomach cancer. Father 60 alive.    ROS:   Please see the history of present illness.    (+) Tremors (+) Chest pain  All other systems reviewed and are negative.  EKGs/Labs/Other Studies Reviewed:    The following studies  were reviewed today: Myocardial Lexiscan Stress Test 02/07/16 Nuclear stress EF: 58%. No T wave inversion was noted during stress. There was no ST segment deviation noted during stress. This is a low risk study. No reversible ischemia. PVC's noted. LVEF 58% with basal to mid septal dyskinesis. This is a low risk study.  EKG:  EKG was personally reviewed 05/08/21: Sinus rhythm, rate 65 bpm; first degree AV block 02/05/2020-sinus rhythm 79 borderline first-degree AV block with left axis deviation.  PR interval 214 ms 01/29/17-sinus rhythm first degree AV block PR interval 218 ms with poor R wave progression personally viewed 01/30/16-sinus rhythm, old inferior infarct pattern, heart rate 81 bpm, PR interval 208 ms. No ST segment changes.    Recent Labs: 02/15/2021: BUN 18; Creatinine, Ser 1.38; Potassium 3.9; Sodium 139  Recent Lipid Panel    Component Value Date/Time   CHOL 109 04/13/2020 0733   TRIG 103.0 04/13/2020 0733   TRIG 270 (HH) 07/09/2006 1702   HDL 35.40 (L) 04/13/2020 0733   CHOLHDL 3 04/13/2020 0733   VLDL 20.6 04/13/2020 0733   LDLCALC 53 04/13/2020 0733   LDLDIRECT 71.0 03/05/2019 0714    Physical Exam:    VS:  BP 120/64 (BP Location: Left Arm, Patient Position: Sitting, Cuff Size: Normal)   Pulse 65   Ht $R'6\' 1"'EA$  (1.854 m)   Wt 198 lb (89.8 kg)   SpO2 95%   BMI 26.12 kg/m     Wt Readings from Last 3 Encounters:  05/08/21 198 lb (89.8 kg)  02/24/21 198 lb (89.8 kg)  02/22/21 197 lb 6.4 oz (89.5 kg)     GEN:  Well nourished, well developed in no acute distress HEENT: Normal NECK: No JVD; No carotid bruits LYMPHATICS: No lymphadenopathy CARDIAC: RRR, no murmurs, rubs, gallops,   CABG scar RESPIRATORY:  Clear to auscultation without rales, wheezing or rhonchi  ABDOMEN: Soft, non-tender, non-distended MUSCULOSKELETAL:  No edema; No deformity  SKIN: Warm and dry NEUROLOGIC:  Alert and oriented x 3 PSYCHIATRIC:  Normal affect   ASSESSMENT:    1. Coronary artery disease involving native coronary artery of native heart without angina pectoris   2. Chest pain of uncertain etiology   3. Mixed hyperlipidemia   4. Essential tremor   5. Diabetes mellitus type 2 in obese [facility])     PLAN:   Chest pain of uncertain etiology Mild pain.  Sometimes in his system and gets up in the middle of the night for the bathroom.  He is not noticing this during exertional activity during the day.  Keep an eye on this.  Prior stress test unremarkable.  Continue with goal-directed medical therapy.  Mixed hyperlipidemia Doing well on high intensity statin atorvastatin 80 mg once a day.  Last LDL 53.  ALT 14.  Excellent.  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  This helps him out quite significantly.  He also has low-dose lorazepam as well.  Did not mention this.  He does have a mild first-degree AV block noted on ECG.  I am comfortable with him however taking his as needed propranolol.  Diabetes mellitus type 2 in obese [facility]) Happy that he is taking Jardiance in the setting of cardiovascular disease.  Hemoglobin A1c of 6.4.  Dr. Alain Marion.  CAD (coronary artery disease) Doing quite well.  Continue with goal-directed medical therapy.  No changes made.  He can reduce his aspirin to 81 mg.  High intensity  statin.  Wonderful.  In order of problems listed above:  Medication Adjustments/Labs and Tests Ordered: Current medicines are reviewed at length with the patient today.  Concerns regarding medicines are outlined above.  Orders Placed This Encounter  Procedures   EKG 12-Lead    Meds ordered this encounter  Medications    aspirin EC 81 MG tablet    Sig: Take 1 tablet (81 mg total) by mouth daily. Swallow whole.    Dispense:  90 tablet    Refill:  3     Patient Instructions  Medication Instructions:  Please decrease Aspirin to 81 mg a day. Continue all other medications as listed.  *If you need a refill on your cardiac medications before your next appointment, please call your pharmacy*  Follow-Up: At [facility], you and your health needs are our priority.  As part of our continuing mission to provide you with exceptional heart care, we have created designated Provider Care Teams.  These Care Teams include your primary Cardiologist (physician) and Advanced Practice Providers (APPs -  Physician Assistants and Nurse Practitioners) who all work together to provide you with the care you need, when you need it.  We recommend signing up for the patient portal called "MyChart".  Sign up information is provided on this After Visit Summary.  MyChart is used to connect with patients for Virtual Visits (Telemedicine).  Patients are able to view lab/test results, encounter notes, upcoming appointments, etc.  Non-urgent messages can be sent to your provider as well.   To learn more about what you can do with MyChart, go to [email or web address].    Your next appointment:   1 year(s)  The format for your next appointment:   In Person  Provider:   Candee Furbish, MD     Thank you for choosing [facility]!!     Wilhemina Bonito as a scribe for Candee Furbish, MD.,have documented all relevant documentation on the behalf of Candee Furbish, MD,as directed by  Candee Furbish, MD while in the presence of Candee Furbish, MD.  I, Candee Furbish, MD, have reviewed all documentation for this visit. The documentation on 05/08/21 for the exam, diagnosis, procedures, and orders are all accurate and complete.   Signed, Candee Furbish, MD  05/08/2021 9:14 AM     [facility]

## 2021-05-08 NOTE — Assessment & Plan Note (Signed)
Doing well on high intensity statin atorvastatin 80 mg once a day.  Last LDL 53.  ALT 14.  Excellent.

## 2021-05-08 NOTE — Patient Instructions (Signed)
Medication Instructions:  Please decrease Aspirin to 81 mg a day. Continue all other medications as listed.  *If you need a refill on your cardiac medications before your next appointment, please call your pharmacy*  Follow-Up: At [facility], you and your health needs are our priority.  As part of our continuing mission to provide you with exceptional heart care, we have created designated Provider Care Teams.  These Care Teams include your primary Cardiologist (physician) and Advanced Practice Providers (APPs -  Physician Assistants and Nurse Practitioners) who all work together to provide you with the care you need, when you need it.  We recommend signing up for the patient portal called "MyChart".  Sign up information is provided on this After Visit Summary.  MyChart is used to connect with patients for Virtual Visits (Telemedicine).  Patients are able to view lab/test results, encounter notes, upcoming appointments, etc.  Non-urgent messages can be sent to your provider as well.   To learn more about what you can do with MyChart, go to [email or web address].    Your next appointment:   1 year(s)  The format for your next appointment:   In Person  Provider:   Candee Furbish, MD     Thank you for choosing [facility]!!

## 2021-05-08 NOTE — Assessment & Plan Note (Signed)
Doing quite well.  Continue with goal-directed medical therapy.  No changes made.  He can reduce his aspirin to 81 mg.  High intensity statin.  Wonderful.

## 2021-05-31 ENCOUNTER — Other Ambulatory Visit (INDEPENDENT_AMBULATORY_CARE_PROVIDER_SITE_OTHER): Payer: Medicare Other

## 2021-05-31 DIAGNOSIS — R944 Abnormal results of kidney function studies: Secondary | ICD-10-CM | POA: Diagnosis not present

## 2021-05-31 DIAGNOSIS — E1169 Type 2 diabetes mellitus with other specified complication: Secondary | ICD-10-CM

## 2021-05-31 DIAGNOSIS — E669 Obesity, unspecified: Secondary | ICD-10-CM

## 2021-05-31 LAB — COMPREHENSIVE METABOLIC PANEL
ALT: 16 U/L (ref 0–53)
AST: 14 U/L (ref 0–37)
Albumin: 4.3 g/dL (ref 3.5–5.2)
Alkaline Phosphatase: 36 U/L — ABNORMAL LOW (ref 39–117)
BUN: 19 mg/dL (ref 6–23)
CO2: 26 mEq/L (ref 19–32)
Calcium: 9.3 mg/dL (ref 8.4–10.5)
Chloride: 105 mEq/L (ref 96–112)
Creatinine, Ser: 1.17 mg/dL (ref 0.40–1.50)
GFR: 63.12 mL/min (ref >60.00)
Glucose, Bld: 136 mg/dL — ABNORMAL HIGH (ref 70–99)
Potassium: 3.8 mEq/L (ref 3.5–5.1)
Sodium: 141 mEq/L (ref 135–145)
Total Bilirubin: 0.9 mg/dL (ref 0.2–1.2)
Total Protein: 6.6 g/dL (ref 6.0–8.3)

## 2021-05-31 LAB — HEMOGLOBIN A1C: Hgb A1c MFr Bld: 6.4 % (ref 4.6–6.5)

## 2021-06-05 ENCOUNTER — Other Ambulatory Visit: Payer: Self-pay

## 2021-06-05 ENCOUNTER — Ambulatory Visit (INDEPENDENT_AMBULATORY_CARE_PROVIDER_SITE_OTHER): Payer: Medicare Other | Admitting: Internal Medicine

## 2021-06-05 ENCOUNTER — Encounter: Payer: Self-pay | Admitting: Internal Medicine

## 2021-06-05 VITALS — BP 120/62 | HR 68 | Temp 98.1°F | Ht 73.0 in | Wt 198.4 lb

## 2021-06-05 DIAGNOSIS — I2510 Atherosclerotic heart disease of native coronary artery without angina pectoris: Secondary | ICD-10-CM | POA: Diagnosis not present

## 2021-06-05 DIAGNOSIS — G250 Essential tremor: Secondary | ICD-10-CM | POA: Diagnosis not present

## 2021-06-05 DIAGNOSIS — I152 Hypertension secondary to endocrine disorders: Secondary | ICD-10-CM

## 2021-06-05 DIAGNOSIS — R944 Abnormal results of kidney function studies: Secondary | ICD-10-CM | POA: Diagnosis not present

## 2021-06-05 DIAGNOSIS — E1159 Type 2 diabetes mellitus with other circulatory complications: Secondary | ICD-10-CM | POA: Diagnosis not present

## 2021-06-05 DIAGNOSIS — Z0000 Encounter for general adult medical examination without abnormal findings: Secondary | ICD-10-CM

## 2021-06-05 DIAGNOSIS — E669 Obesity, unspecified: Secondary | ICD-10-CM

## 2021-06-05 DIAGNOSIS — E1169 Type 2 diabetes mellitus with other specified complication: Secondary | ICD-10-CM

## 2021-06-05 DIAGNOSIS — Z23 Encounter for immunization: Secondary | ICD-10-CM | POA: Diagnosis not present

## 2021-06-05 NOTE — Addendum Note (Signed)
Addended by: Earnstine Regal on: 06/05/2021 08:51 AM   Modules accepted: Orders

## 2021-06-05 NOTE — Assessment & Plan Note (Signed)
Glipizide, Metformin Jardiance added

## 2021-06-05 NOTE — Progress Notes (Addendum)
Subjective:  Patient ID: [name] [name]., male    DOB: [birth year]  Age: 70 y.o. MRN: [id number]  CC: Follow-up (3 month f/u- Flu shot)   HPI [name] [name] [employer]. presents for HTN, DM, anxiety - better on Propranolol  F/u CAD Barb w/C diff colitis  Outpatient Medications Prior to Visit  Medication Sig Dispense Refill   ACCU-CHEK GUIDE test strip USE TO TEST BLOOD SUGAR FIVE TIMES A DAY. DX: E11.69 450 strip 2   aspirin EC 81 MG tablet Take 1 tablet (81 mg total) by mouth daily. Swallow whole. 90 tablet 3   atorvastatin (LIPITOR) 80 MG tablet TAKE 1 TABLET BY MOUTH EVERY DAY 90 tablet 2   Blood Glucose Monitoring Suppl (ACCU-CHEK GUIDE ME) w/Device KIT 1 Units by Does not apply route 5 (five) times daily. 1 kit 1   buPROPion (WELLBUTRIN XL) 150 MG 24 hr tablet TAKE 1 TABLET BY MOUTH EVERY DAY 90 tablet 2   Cholecalciferol 1000 UNITS tablet Take 1 tablet (1,000 Units total) by mouth daily. 200 tablet 3   diphenhydrAMINE (BENADRYL) 25 MG tablet Take 1-2 tablets (25-50 mg total) by mouth every 8 (eight) hours as needed for allergies. 60 tablet 5   enalapril (VASOTEC) 5 MG tablet TAKE 2 TABLETS BY MOUTH EVERY DAY. 180 tablet 2   glipiZIDE (GLUCOTROL XL) 5 MG 24 hr tablet TAKE 2 TABLETS BY MOUT IN AM OR ONE TWICE A DAY 180 tablet 2   ibuprofen (ADVIL) 600 MG tablet TAKE 1 TABLET (600 MG TOTAL) BY MOUTH EVERY 4 (FOUR) HOURS AS NEEDED. FOR PAIN 30 tablet 0   indomethacin (INDOCIN SR) 75 MG CR capsule TAKE 1 CAPSULE BY MOUTH TWICE A DAY WITH MEAL 60 capsule 1   JARDIANCE 10 MG TABS tablet TAKE 1 TABLET BY MOUTH EVERY DAY 90 tablet 2   Lancets (ONETOUCH DELICA PLUS [id number]) MISC USE TO HELP CHECK BLOOD SUGARS 5 TIMES A DAY 500 each 3   LORazepam (ATIVAN) 0.5 MG tablet Take 1 tablet (0.5 mg total) by mouth 2 (two) times daily as needed for anxiety (tremot). 60 tablet 1   meclizine (ANTIVERT) 12.5 MG tablet TAKE 1 TABLET (12.5 MG TOTAL) BY MOUTH 3 (THREE) TIMES DAILY AS NEEDED. 90 tablet  1   metFORMIN (GLUCOPHAGE) 1000 MG tablet TAKE 1 TABLET BY MOUTH TWICE A DAY WITH MEAL 180 tablet 2   niacin (NIASPAN) 500 MG CR tablet TAKE 1 TABLET BY MOUTH AT BEDTIME. 90 tablet 3   nitroGLYCERIN (NITROSTAT) 0.4 MG SL tablet PLACE 1 TABLET UNDER THE TOUNGE EVERY 5 MINUTES AS NEEDED FOR CHEST PAIN 25 tablet 1   OMEGA 3-6-9 FATTY ACIDS PO Take 1 tablet by mouth daily.     propranolol (INDERAL) 10 MG tablet Take 1-2 tablets (10-20 mg total) by mouth daily as needed. 60 tablet 3   silodosin (RAPAFLO) 8 MG CAPS capsule TAKE 1 CAPSULE BY MOUTH EVERY DAY 90 capsule 2   tadalafil (CIALIS) 5 MG tablet TAKE ONE TABLET DAILY FOR BPH 90 tablet 2   valACYclovir (VALTREX) 500 MG tablet TAKE 1 TABLET BY MOUTH THREE TIMES A DAY 21 tablet 2   No facility-administered medications prior to visit.    ROS: Review of Systems  Constitutional:  Negative for appetite change, fatigue and unexpected weight change.  HENT:  Negative for congestion, nosebleeds, sneezing, sore throat and trouble swallowing.   Eyes:  Negative for itching and visual disturbance.  Respiratory:  Negative for cough.  Cardiovascular:  Negative for chest pain, palpitations and leg swelling.  Gastrointestinal:  Negative for abdominal distention, blood in stool, diarrhea and nausea.  Genitourinary:  Negative for frequency and hematuria.  Musculoskeletal:  Negative for back pain, gait problem, joint swelling and neck pain.  Skin:  Negative for rash.  Neurological:  Negative for dizziness, tremors, speech difficulty and weakness.  Psychiatric/Behavioral:  Negative for agitation, dysphoric mood, sleep disturbance and suicidal ideas. The patient is not nervous/anxious.    Objective:  BP 120/62 (BP Location: Left Arm)   Pulse 68   Temp 98.1 F (36.7 C) (Oral)   Ht $R'6\' 1"'BO$  (1.854 m)   Wt 198 lb 6.4 oz (90 kg)   SpO2 96%   BMI 26.18 kg/m   BP Readings from Last 3 Encounters:  06/05/21 120/62  05/08/21 120/64  02/24/21 116/62    Wt  Readings from Last 3 Encounters:  06/05/21 198 lb 6.4 oz (90 kg)  05/08/21 198 lb (89.8 kg)  02/24/21 198 lb (89.8 kg)    Physical Exam Constitutional:      General: He is not in acute distress.    Appearance: He is well-developed.     Comments: NAD  Eyes:     Conjunctiva/sclera: Conjunctivae normal.     Pupils: Pupils are equal, round, and reactive to light.  Neck:     Thyroid: No thyromegaly.     Vascular: No JVD.  Cardiovascular:     Rate and Rhythm: Normal rate and regular rhythm.     Heart sounds: Normal heart sounds. No murmur heard.   No friction rub. No gallop.  Pulmonary:     Effort: Pulmonary effort is normal. No respiratory distress.     Breath sounds: Normal breath sounds. No wheezing or rales.  Chest:     Chest wall: No tenderness.  Abdominal:     General: Bowel sounds are normal. There is no distension.     Palpations: Abdomen is soft. There is no mass.     Tenderness: There is no abdominal tenderness. There is no guarding or rebound.  Musculoskeletal:        General: No tenderness. Normal range of motion.     Cervical back: Normal range of motion.  Lymphadenopathy:     Cervical: No cervical adenopathy.  Skin:    General: Skin is warm and dry.     Findings: No rash.  Neurological:     Mental Status: He is alert and oriented to person, place, and time.     Cranial Nerves: No cranial nerve deficit.     Motor: No abnormal muscle tone.     Coordination: Coordination normal.     Gait: Gait normal.     Deep Tendon Reflexes: Reflexes are normal and symmetric.  Psychiatric:        Behavior: Behavior normal.        Thought Content: Thought content normal.        Judgment: Judgment normal.    Lab Results  Component Value Date   WBC 6.8 04/13/2020   HGB 15.4 04/13/2020   HCT 44.9 04/13/2020   PLT 177.0 04/13/2020   GLUCOSE 136 (H) 05/31/2021   CHOL 109 04/13/2020   TRIG 103.0 04/13/2020   HDL 35.40 (L) 04/13/2020   LDLDIRECT 71.0 03/05/2019   LDLCALC  53 04/13/2020   ALT 16 05/31/2021   AST 14 05/31/2021   NA 141 05/31/2021   K 3.8 05/31/2021   CL 105 05/31/2021   CREATININE 1.17 05/31/2021  BUN 19 05/31/2021   CO2 26 05/31/2021   TSH 3.47 04/13/2020   PSA 0.68 04/13/2020   INR 1.3 04/08/2008   HGBA1C 6.4 05/31/2021   MICROALBUR 11.9 (H) 04/13/2020    US RENAL  Result Date: 12/09/2020 CLINICAL DATA:  Decreased GFR. EXAM: RENAL / URINARY TRACT ULTRASOUND COMPLETE COMPARISON:  None. FINDINGS: Right Kidney: Renal measurements: 12.0 x 5.3 x 5.1 cm = volume: 168 mL. Echogenicity within normal limits. No mass or hydronephrosis visualized. Left Kidney: Renal measurements: 12.3 x 5.5 x 4.8 cm = volume: 169 mL. Echogenicity within normal limits. No mass or hydronephrosis visualized. Bladder: Appears normal for degree of bladder distention. Bilateral ureteral jets are noted. Other: Mild prostatic enlargement is noted. IMPRESSION: Kidneys are unremarkable.  Mild prostatic enlargement. Electronically Signed   By: Marijo Conception M.D.   On: 12/09/2020 15:00    Assessment & Plan:   Problem List Items Addressed This Visit     CAD (coronary artery disease)    Cont on Lipitor, ASA      Relevant Orders   Hemoglobin A1c   Comprehensive metabolic panel   Lipid panel   TSH   Decreased GFR    Better Hydrate well      Diabetes mellitus type 2 in obese (HCC)    Glipizide, Metformin Jardiance added      Relevant Orders   Hemoglobin A1c   Comprehensive metabolic panel   Lipid panel   TSH   Urinalysis   Microalbumin / creatinine urine ratio   Essential tremor    Better on prn Propranolol      Hypertension associated with diabetes ([place])    Cont on Enalapril      Well adult exam - Primary   Relevant Orders   PSA   Urinalysis      No orders of the defined types were placed in this encounter.     Follow-up: Return in about 3 months (around 09/03/2021) for Wellness Exam.  Walker Kehr, MD

## 2021-06-05 NOTE — Assessment & Plan Note (Signed)
Cont on Enalapril 

## 2021-06-05 NOTE — Assessment & Plan Note (Signed)
Better on prn Propranolol

## 2021-06-05 NOTE — Addendum Note (Signed)
Addended by: Cassandria Anger on: 06/05/2021 08:25 AM   Modules accepted: Orders

## 2021-06-05 NOTE — Assessment & Plan Note (Signed)
Cont on Lipitor, ASA

## 2021-06-05 NOTE — Assessment & Plan Note (Addendum)
Better Hydrate well

## 2021-06-06 ENCOUNTER — Telehealth: Payer: Self-pay | Admitting: Internal Medicine

## 2021-06-06 NOTE — Telephone Encounter (Signed)
Left message for patient to call me back at [phone] to schedule Medicare Annual Wellness Visit   Last AWV  04/22/20  Please schedule at anytime with [facility] if patient calls the office back.    40 Minutes appointment   Any questions, please call me at [phone]

## 2021-06-13 ENCOUNTER — Ambulatory Visit (INDEPENDENT_AMBULATORY_CARE_PROVIDER_SITE_OTHER): Payer: Medicare Other

## 2021-06-13 ENCOUNTER — Other Ambulatory Visit: Payer: Self-pay

## 2021-06-13 VITALS — Ht 73.0 in | Wt 199.8 lb

## 2021-06-13 DIAGNOSIS — Z0000 Encounter for general adult medical examination without abnormal findings: Secondary | ICD-10-CM

## 2021-06-13 NOTE — Patient Instructions (Signed)
[name] [name] , Thank you for taking time to come for your Medicare Wellness Visit. I appreciate your ongoing commitment to your health goals. Please review the following plan we discussed and let me know if I can assist you in the future.   Screening recommendations/referrals: Colonoscopy: 02/24/2021; due every 7 years (due 02/25/2028) Recommended yearly ophthalmology/optometry visit for glaucoma screening and checkup Recommended yearly dental visit for hygiene and checkup  Vaccinations: Influenza vaccine: 06/05/2021 Pneumococcal vaccine: 06/16/2013, 08/03/2016 Tdap vaccine: 06/17/2015; due every 10 years (due 06/16/2025) Shingles vaccine: 11/05/2017, 01/05/2018   Covid-19: 08/28/2019, 09/22/2019, 04/26/2020  Advanced directives: Please bring a copy of your health care power of attorney and living will to the office at your convenience.  Conditions/risks identified: Yes; Client understands the importance of follow-up with providers by attending scheduled visits and discussed goals to eat healthier, increase physical activity, exercise the brain, socialize more, get enough sleep and make time for laughter.  Next appointment: Please schedule your next Medicare Wellness Visit with your Nurse Health Advisor in 1 year by calling [phone].  Preventive Care 31 Years and Older, Male Preventive care refers to lifestyle choices and visits with your health care provider that can promote health and wellness. What does preventive care include? A yearly physical exam. This is also called an annual well check. Dental exams once or twice a year. Routine eye exams. Ask your health care provider how often you should have your eyes checked. Personal lifestyle choices, including: Daily care of your teeth and gums. Regular physical activity. Eating a healthy diet. Avoiding tobacco and drug use. Limiting alcohol use. Practicing safe sex. Taking low doses of aspirin every day. Taking vitamin and mineral  supplements as recommended by your health care provider. What happens during an annual well check? The services and screenings done by your health care provider during your annual well check will depend on your age, overall health, lifestyle risk factors, and family history of disease. Counseling  Your health care provider may ask you questions about your: Alcohol use. Tobacco use. Drug use. Emotional well-being. Home and relationship well-being. Sexual activity. Eating habits. History of falls. Memory and ability to understand (cognition). Work and work Statistician. Screening  You may have the following tests or measurements: Height, weight, and BMI. Blood pressure. Lipid and cholesterol levels. These may be checked every 5 years, or more frequently if you are over 35 years old. Skin check. Lung cancer screening. You may have this screening every year starting at age 27 if you have a 30-pack-year history of smoking and currently smoke or have quit within the past 15 years. Fecal occult blood test (FOBT) of the stool. You may have this test every year starting at age 65. Flexible sigmoidoscopy or colonoscopy. You may have a sigmoidoscopy every 5 years or a colonoscopy every 10 years starting at age 13. Prostate cancer screening. Recommendations will vary depending on your family history and other risks. Hepatitis C blood test. Hepatitis B blood test. Sexually transmitted disease (STD) testing. Diabetes screening. This is done by checking your blood sugar (glucose) after you have not eaten for a while (fasting). You may have this done every 1-3 years. Abdominal aortic aneurysm (AAA) screening. You may need this if you are a current or former smoker. Osteoporosis. You may be screened starting at age 56 if you are at high risk. Talk with your health care provider about your test results, treatment options, and if necessary, the need for more tests. Vaccines  Your health  care provider  may recommend certain vaccines, such as: Influenza vaccine. This is recommended every year. Tetanus, diphtheria, and acellular pertussis (Tdap, Td) vaccine. You may need a Td booster every 10 years. Zoster vaccine. You may need this after age 69. Pneumococcal 13-valent conjugate (PCV13) vaccine. One dose is recommended after age 33. Pneumococcal polysaccharide (PPSV23) vaccine. One dose is recommended after age 81. Talk to your health care provider about which screenings and vaccines you need and how often you need them. This information is not intended to replace advice given to you by your health care provider. Make sure you discuss any questions you have with your health care provider. Document Released: 07/15/2015 Document Revised: 03/07/2016 Document Reviewed: 04/19/2015 Elsevier Interactive Patient Education  2017 [place] Prevention in the Home Falls can cause injuries. They can happen to people of all ages. There are many things you can do to make your home safe and to help prevent falls. What can I do on the outside of my home? Regularly fix the edges of walkways and driveways and fix any cracks. Remove anything that might make you trip as you walk through a door, such as a raised step or threshold. Trim any bushes or trees on the path to your home. Use bright outdoor lighting. Clear any walking paths of anything that might make someone trip, such as rocks or tools. Regularly check to see if handrails are loose or broken. Make sure that both sides of any steps have handrails. Any raised decks and porches should have guardrails on the edges. Have any leaves, snow, or ice cleared regularly. Use sand or salt on walking paths during winter. Clean up any spills in your garage right away. This includes oil or grease spills. What can I do in the bathroom? Use night lights. Install grab bars by the toilet and in the tub and shower. Do not use towel bars as grab bars. Use  non-skid mats or decals in the tub or shower. If you need to sit down in the shower, use a plastic, non-slip stool. Keep the floor dry. Clean up any water that spills on the floor as soon as it happens. Remove soap buildup in the tub or shower regularly. Attach bath mats securely with double-sided non-slip rug tape. Do not have throw rugs and other things on the floor that can make you trip. What can I do in the bedroom? Use night lights. Make sure that you have a light by your bed that is easy to reach. Do not use any sheets or blankets that are too big for your bed. They should not hang down onto the floor. Have a firm chair that has side arms. You can use this for support while you get dressed. Do not have throw rugs and other things on the floor that can make you trip. What can I do in the kitchen? Clean up any spills right away. Avoid walking on wet floors. Keep items that you use a lot in easy-to-reach places. If you need to reach something above you, use a strong step stool that has a grab bar. Keep electrical cords out of the way. Do not use floor polish or wax that makes floors slippery. If you must use wax, use non-skid floor wax. Do not have throw rugs and other things on the floor that can make you trip. What can I do with my stairs? Do not leave any items on the stairs. Make sure that there are handrails on  both sides of the stairs and use them. Fix handrails that are broken or loose. Make sure that handrails are as long as the stairways. Check any carpeting to make sure that it is firmly attached to the stairs. Fix any carpet that is loose or worn. Avoid having throw rugs at the top or bottom of the stairs. If you do have throw rugs, attach them to the floor with carpet tape. Make sure that you have a light switch at the top of the stairs and the bottom of the stairs. If you do not have them, ask someone to add them for you. What else can I do to help prevent falls? Wear  shoes that: Do not have high heels. Have rubber bottoms. Are comfortable and fit you well. Are closed at the toe. Do not wear sandals. If you use a stepladder: Make sure that it is fully opened. Do not climb a closed stepladder. Make sure that both sides of the stepladder are locked into place. Ask someone to hold it for you, if possible. Clearly mark and make sure that you can see: Any grab bars or handrails. First and last steps. Where the edge of each step is. Use tools that help you move around (mobility aids) if they are needed. These include: Canes. Walkers. Scooters. Crutches. Turn on the lights when you go into a dark area. Replace any light bulbs as soon as they burn out. Set up your furniture so you have a clear path. Avoid moving your furniture around. If any of your floors are uneven, fix them. If there are any pets around you, be aware of where they are. Review your medicines with your doctor. Some medicines can make you feel dizzy. This can increase your chance of falling. Ask your doctor what other things that you can do to help prevent falls. This information is not intended to replace advice given to you by your health care provider. Make sure you discuss any questions you have with your health care provider. Document Released: 04/14/2009 Document Revised: 11/24/2015 Document Reviewed: 07/23/2014 Elsevier Interactive Patient Education  2017 [employer].

## 2021-06-13 NOTE — Progress Notes (Addendum)
Subjective:   [name] [name]. is a 70 y.o. male who presents for Medicare Annual/Subsequent preventive examination.  Review of Systems     Cardiac Risk Factors include: advanced age (>77men, >33 women);diabetes mellitus;dyslipidemia;family history of premature cardiovascular disease;hypertension;male gender     Objective:    Today's Vitals   06/13/21 1440  Weight: 199 lb 12.8 oz (90.6 kg)  Height: $Remove'6\' 1"'qnzlmKy$  (1.854 m)   Body mass index is 26.36 kg/m.  Advanced Directives 06/13/2021 04/22/2020 09/27/2015  Does Patient Have a Medical Advance Directive? Yes Yes No  Type of Advance Directive Living will;Healthcare Power of Attorney - -  Does patient want to make changes to medical advance directive? No - Patient declined No - Patient declined -  Copy of [place] in Chart? No - copy requested - -    Current Medications (verified) Outpatient Encounter Medications as of 06/13/2021  Medication Sig   ACCU-CHEK GUIDE test strip USE TO TEST BLOOD SUGAR FIVE TIMES A DAY. DX: E11.69   aspirin EC 81 MG tablet Take 1 tablet (81 mg total) by mouth daily. Swallow whole.   atorvastatin (LIPITOR) 80 MG tablet TAKE 1 TABLET BY MOUTH EVERY DAY   Blood Glucose Monitoring Suppl (ACCU-CHEK GUIDE ME) w/Device KIT 1 Units by Does not apply route 5 (five) times daily.   buPROPion (WELLBUTRIN XL) 150 MG 24 hr tablet TAKE 1 TABLET BY MOUTH EVERY DAY   Cholecalciferol 1000 UNITS tablet Take 1 tablet (1,000 Units total) by mouth daily.   diphenhydrAMINE (BENADRYL) 25 MG tablet Take 1-2 tablets (25-50 mg total) by mouth every 8 (eight) hours as needed for allergies.   enalapril (VASOTEC) 5 MG tablet TAKE 2 TABLETS BY MOUTH EVERY DAY.   glipiZIDE (GLUCOTROL XL) 5 MG 24 hr tablet TAKE 2 TABLETS BY MOUT IN AM OR ONE TWICE A DAY   ibuprofen (ADVIL) 600 MG tablet TAKE 1 TABLET (600 MG TOTAL) BY MOUTH EVERY 4 (FOUR) HOURS AS NEEDED. FOR PAIN   indomethacin (INDOCIN SR) 75 MG CR capsule TAKE  1 CAPSULE BY MOUTH TWICE A DAY WITH MEAL   JARDIANCE 10 MG TABS tablet TAKE 1 TABLET BY MOUTH EVERY DAY   Lancets (ONETOUCH DELICA PLUS [id number]) MISC USE TO HELP CHECK BLOOD SUGARS 5 TIMES A DAY   LORazepam (ATIVAN) 0.5 MG tablet Take 1 tablet (0.5 mg total) by mouth 2 (two) times daily as needed for anxiety (tremot).   meclizine (ANTIVERT) 12.5 MG tablet TAKE 1 TABLET (12.5 MG TOTAL) BY MOUTH 3 (THREE) TIMES DAILY AS NEEDED.   metFORMIN (GLUCOPHAGE) 1000 MG tablet TAKE 1 TABLET BY MOUTH TWICE A DAY WITH MEAL   niacin (NIASPAN) 500 MG CR tablet TAKE 1 TABLET BY MOUTH AT BEDTIME.   nitroGLYCERIN (NITROSTAT) 0.4 MG SL tablet PLACE 1 TABLET UNDER THE TOUNGE EVERY 5 MINUTES AS NEEDED FOR CHEST PAIN   OMEGA 3-6-9 FATTY ACIDS PO Take 1 tablet by mouth daily.   propranolol (INDERAL) 10 MG tablet Take 1-2 tablets (10-20 mg total) by mouth daily as needed.   silodosin (RAPAFLO) 8 MG CAPS capsule TAKE 1 CAPSULE BY MOUTH EVERY DAY   tadalafil (CIALIS) 5 MG tablet TAKE ONE TABLET DAILY FOR BPH   valACYclovir (VALTREX) 500 MG tablet TAKE 1 TABLET BY MOUTH THREE TIMES A DAY   No facility-administered encounter medications on file as of 06/13/2021.    Allergies (verified) Iohexol and Sulfadiazine   History: Past Medical History:  Diagnosis Date  Aortic root dilatation (HCC)    slight, echo 2009   Arthritis    WRISTS, HIP   Blood transfusion without reported diagnosis    CAD (coronary artery disease)    Nuclear, Jul 04, 2009 NO ischemia   Cataract    Diabetes mellitus    Diverticulosis    Heart murmur    Herpes simplex    Hx of CABG 04/01/2008   October, 2009   Hyperlipidemia    Hypertension    Kidney cysts    ~09   Low testosterone 07/02/2008   LOW DHEA   Microalbuminuria    Snoring    prior history of surgery for sleep apnea   Past Surgical History:  Procedure Laterality Date   APPENDECTOMY  07/02/1976   CHOLECYSTECTOMY  07/03/2007   COLONOSCOPY     CORONARY ARTERY BYPASS  GRAFT  07/03/2007   POLYPECTOMY     Removal of UVULA     ENT   TONSILECTOMY, ADENOIDECTOMY, BILATERAL MYRINGOTOMY AND TUBES     NO PE TUBES PER PT - NOSE SHAVED AS WELL   Family History  Problem Relation Age of Onset   Diabetes Mother    Mental illness Mother 9       Alzheimer   Diabetes Father    Heart disease Father        CAD   Hypertension Other    Colon cancer Neg Hx    Colon polyps Neg Hx    Esophageal cancer Neg Hx    Rectal cancer Neg Hx    Stomach cancer Neg Hx    Social History   Socioeconomic History   Marital status: Married    Spouse name: Not on file   Number of children: 3   Years of education: Not on file   Highest education level: Master's degree (e.g., MA, MS, MEng, MEd, MSW, MBA)  Occupational History   Not on file  Tobacco Use   Smoking status: Never   Smokeless tobacco: Never  Substance and Sexual Activity   Alcohol use: No    Alcohol/week: 0.0 standard drinks   Drug use: No   Sexual activity: Yes  Other Topics Concern   Not on file  Social History Narrative   Travels a lotRegular exercise - Yes, 4,000-6,000 steps daily   Social Determinants of Health   Financial Resource Strain: Low Risk    Difficulty of Paying Living Expenses: Not hard at all  Food Insecurity: No Food Insecurity   Worried About Charity fundraiser in the Last Year: Never true   Arboriculturist in the Last Year: Never true  Transportation Needs: No Transportation Needs   Lack of Transportation (Medical): No   Lack of Transportation (Non-Medical): No  Physical Activity: Sufficiently Active   Days of Exercise per Week: 5 days   Minutes of Exercise per Session: 30 min  Stress: No Stress Concern Present   Feeling of Stress : Not at all  Social Connections: Socially Integrated   Frequency of Communication with Friends and Family: More than three times a week   Frequency of Social Gatherings with Friends and Family: Once a week   Attends Religious Services: More than 4  times per year   Active Member of [employer] or Organizations: Yes   Attends Music therapist: More than 4 times per year   Marital Status: Married    Tobacco Counseling Counseling given: Not Answered   Clinical Intake:  Pre-visit preparation completed: Yes  Pain :  No/denies pain     BMI - recorded: 26.36 Nutritional Status: BMI 25 -29 Overweight Nutritional Risks: None Diabetes: Yes CBG done?: No Did pt. bring in CBG monitor from home?: No  How often do you need to have someone help you when you read instructions, pamphlets, or other written materials from your doctor or pharmacy?: 1 - Never What is the last grade level you completed in school?: 2 years of post graduate  Diabetic? yes  Interpreter Needed?: No  Information entered by :: Lisette Abu, LPN   Activities of Daily Living In your present state of health, do you have any difficulty performing the following activities: 06/13/2021  Hearing? N  Vision? N  Difficulty concentrating or making decisions? N  Walking or climbing stairs? N  Dressing or bathing? N  Doing errands, shopping? N  Preparing Food and eating ? N  Using the Toilet? N  In the past six months, have you accidently leaked urine? N  Do you have problems with loss of bowel control? N  Managing your Medications? N  Managing your Finances? N  Housekeeping or managing your Housekeeping? N  Some recent data might be hidden    Patient Care Team: Plotnikov, Evie Lacks, MD as PCP - General Jerline Pain, MD as PCP - Cardiology (Cardiology)  Indicate any recent Medical Services you may have received from other than [facility] providers in the past year (date may be approximate).     Assessment:   This is a routine wellness examination for [place].  Hearing/Vision screen Hearing Screening - Comments:: Patient denied any hearing difficulty. No hearing aids. Vision Screening - Comments:: Patient wears eyeglasses. Eye exams done by Marica Otter, OD.  Dietary issues and exercise activities discussed: Current Exercise Habits: Home exercise routine, Time (Minutes): 30, Frequency (Times/Week): 5, Weekly Exercise (Minutes/Week): 150, Intensity: Moderate, Exercise limited by: None identified   Goals Addressed               This Visit's Progress     Patient Stated (pt-stated)        My goal is to keep breathing.      Depression Screen PHQ 2/9 Scores 06/13/2021 06/05/2021 04/22/2020 04/20/2020 03/16/2019 05/17/2017 01/09/2016  PHQ - 2 Score 0 0 0 0 0 0 0  PHQ- 9 Score - 0 - - - - -    Fall Risk Fall Risk  06/13/2021 06/05/2021 04/22/2020 04/20/2020 03/16/2019  Falls in the past year? 0 0 0 0 0  Number falls in past yr: 0 0 0 0 -  Injury with Fall? 0 0 0 0 -  Risk for fall due to : No Fall Risks No Fall Risks No Fall Risks - -  Follow up - - Falls evaluation completed - Falls evaluation completed    FALL RISK PREVENTION PERTAINING TO THE HOME:  Any stairs in or around the home? No  If so, are there any without handrails? No  Home free of loose throw rugs in walkways, pet beds, electrical cords, etc? Yes  Adequate lighting in your home to reduce risk of falls? Yes   ASSISTIVE DEVICES UTILIZED TO PREVENT FALLS:  Life alert? No  Use of a cane, walker or w/c? No  Grab bars in the bathroom? No  Shower chair or bench in shower? No  Elevated toilet seat or a handicapped toilet? No   TIMED UP AND GO:  Was the test performed? Yes .  Length of time to ambulate 10 feet: 6 sec.  Gait steady and fast without use of assistive device  Cognitive Function: Normal cognitive status assessed by direct observation by this Nurse Health Advisor. No abnormalities found.          Immunizations Immunization History  Administered Date(s) Administered   Fluad Quad(high Dose 65+) 03/16/2019, 04/20/2020, 06/05/2021   H1N1 06/07/2008   Hep A / Hep B 12/13/2017, 01/14/2018, 06/17/2018   Hepatitis A 05/17/1999   Influenza Split  05/11/2011   Influenza Whole 05/06/2008, 04/01/2009, 03/30/2010, 04/02/2012   Influenza, Quadrivalent, Recombinant, Inj, Pf 03/25/2018   Influenza,inj,Quad PF,6+ Mos 03/10/2013, 05/20/2015   Influenza-Unspecified 03/31/2014, 03/28/2016, 03/27/2017   Meningococcal Mcv4o 12/13/2017   Meningococcal Polysaccharide 05/24/2010   PFIZER(Purple Top)SARS-COV-2 Vaccination 08/28/2019, 09/22/2019, 04/26/2020   Pneumococcal Conjugate-13 06/16/2013   Pneumococcal Polysaccharide-23 08/03/2016   Td 05/24/2010   Tdap 06/17/2015   Zoster Recombinat (Shingrix) 11/05/2017, 01/05/2018   Zoster, Live 03/10/2013    TDAP status: Up to date  Flu Vaccine status: Up to date  Pneumococcal vaccine status: Up to date  Covid-19 vaccine status: Completed vaccines  Qualifies for Shingles Vaccine? Yes   Zostavax completed Yes   Shingrix Completed?: Yes  Screening Tests Health Maintenance  Topic Date Due   FOOT EXAM  Never done   OPHTHALMOLOGY EXAM  06/30/2014   COVID-19 Vaccine (4 - Booster for Pfizer series) 06/21/2020   HEMOGLOBIN A1C  11/28/2021   TETANUS/TDAP  06/16/2025   COLONOSCOPY (Pts 45-39yrs Insurance coverage will need to be confirmed)  02/25/2028   Pneumonia Vaccine 1+ Years old  Completed   INFLUENZA VACCINE  Completed   Hepatitis C Screening  Completed   Zoster Vaccines- Shingrix  Completed   HPV VACCINES  Aged Out    Health Maintenance  Health Maintenance Due  Topic Date Due   FOOT EXAM  Never done   OPHTHALMOLOGY EXAM  06/30/2014   COVID-19 Vaccine (4 - Booster for Pfizer series) 06/21/2020    Colorectal cancer screening: Type of screening: Colonoscopy. Completed 02/24/2021. Repeat every 7 years  Lung Cancer Screening: (Low Dose CT Chest recommended if Age 45-80 years, 30 pack-year currently smoking OR have quit w/in 15years.) does not qualify.   Lung Cancer Screening Referral: no  Additional Screening:  Hepatitis C Screening: does qualify; Completed yes  Vision  Screening: Recommended annual ophthalmology exams for early detection of glaucoma and other disorders of the eye. Is the patient up to date with their annual eye exam?  Yes  Who is the provider or what is the name of the office in which the patient attends annual eye exams? Marica Otter, OD. If pt is not established with a provider, would they like to be referred to a provider to establish care? No .   Dental Screening: Recommended annual dental exams for proper oral hygiene  Community Resource Referral / Chronic Care Management: CRR required this visit?  No   CCM required this visit?  No      Plan:     I have personally reviewed and noted the following in the patients chart:   Medical and social history Use of alcohol, tobacco or illicit drugs  Current medications and supplements including opioid prescriptions. Patient is not currently taking opioid prescriptions. Functional ability and status Nutritional status Physical activity Advanced directives List of other physicians Hospitalizations, surgeries, and ER visits in previous 12 months Vitals Screenings to include cognitive, depression, and falls Referrals and appointments  In addition, I have reviewed and discussed with patient certain preventive protocols, quality metrics, and  best practice recommendations. A written personalized care plan for preventive services as well as general preventive health recommendations were provided to patient.     Sheral Flow, LPN   00/06/3934   Nurse Notes:  Hearing Screening - Comments:: Patient denied any hearing difficulty. No hearing aids. Vision Screening - Comments:: Patient wears eyeglasses. Eye exams done by Marica Otter, OD.   Medical screening examination/treatment/procedure(s) were performed by non-physician practitioner and as supervising physician I was immediately available for consultation/collaboration.  I agree with above. Lew Dawes, MD

## 2021-08-31 ENCOUNTER — Other Ambulatory Visit (INDEPENDENT_AMBULATORY_CARE_PROVIDER_SITE_OTHER): Payer: Medicare Other

## 2021-08-31 DIAGNOSIS — E669 Obesity, unspecified: Secondary | ICD-10-CM

## 2021-08-31 DIAGNOSIS — Z0000 Encounter for general adult medical examination without abnormal findings: Secondary | ICD-10-CM | POA: Diagnosis not present

## 2021-08-31 DIAGNOSIS — I2510 Atherosclerotic heart disease of native coronary artery without angina pectoris: Secondary | ICD-10-CM | POA: Diagnosis not present

## 2021-08-31 DIAGNOSIS — E1169 Type 2 diabetes mellitus with other specified complication: Secondary | ICD-10-CM | POA: Diagnosis not present

## 2021-08-31 LAB — LIPID PANEL
Cholesterol: 116 mg/dL (ref 0–200)
HDL: 36.1 mg/dL — ABNORMAL LOW (ref >39.00)
LDL Cholesterol: 51 mg/dL (ref 0–99)
NonHDL: 80.36
Total CHOL/HDL Ratio: 3
Triglycerides: 148 mg/dL (ref 0.0–149.0)
VLDL: 29.6 mg/dL (ref 0.0–40.0)

## 2021-08-31 LAB — COMPREHENSIVE METABOLIC PANEL
ALT: 21 U/L (ref 0–53)
AST: 19 U/L (ref 0–37)
Albumin: 4.3 g/dL (ref 3.5–5.2)
Alkaline Phosphatase: 50 U/L (ref 39–117)
BUN: 19 mg/dL (ref 6–23)
CO2: 26 mEq/L (ref 19–32)
Calcium: 9.6 mg/dL (ref 8.4–10.5)
Chloride: 105 mEq/L (ref 96–112)
Creatinine, Ser: 1.14 mg/dL (ref 0.40–1.50)
GFR: 65.01 mL/min (ref >60.00)
Glucose, Bld: 118 mg/dL — ABNORMAL HIGH (ref 70–99)
Potassium: 4 mEq/L (ref 3.5–5.1)
Sodium: 140 mEq/L (ref 135–145)
Total Bilirubin: 1.1 mg/dL (ref 0.2–1.2)
Total Protein: 6.3 g/dL (ref 6.0–8.3)

## 2021-08-31 LAB — URINALYSIS
Bilirubin Urine: NEGATIVE
Hgb urine dipstick: NEGATIVE
Ketones, ur: NEGATIVE
Leukocytes,Ua: NEGATIVE
Nitrite: NEGATIVE
Specific Gravity, Urine: 1.015 (ref 1.000–1.030)
Total Protein, Urine: NEGATIVE
Urine Glucose: >=1000 — AB
Urobilinogen, UA: 0.2 (ref 0.0–1.0)
pH: 5.5 (ref 5.0–8.0)

## 2021-08-31 LAB — HEMOGLOBIN A1C: Hgb A1c MFr Bld: 6.5 % (ref 4.6–6.5)

## 2021-08-31 LAB — PSA: PSA: 0.8 ng/mL (ref 0.10–4.00)

## 2021-08-31 LAB — MICROALBUMIN / CREATININE URINE RATIO
Creatinine,U: 72.9 mg/dL
Microalb Creat Ratio: 13 mg/g (ref 0.0–30.0)
Microalb, Ur: 9.5 mg/dL — ABNORMAL HIGH (ref 0.0–1.9)

## 2021-08-31 LAB — TSH: TSH: 1.97 u[IU]/mL (ref 0.35–5.50)

## 2021-09-07 ENCOUNTER — Ambulatory Visit (INDEPENDENT_AMBULATORY_CARE_PROVIDER_SITE_OTHER): Payer: Medicare Other | Admitting: Internal Medicine

## 2021-09-07 ENCOUNTER — Encounter: Payer: Self-pay | Admitting: Internal Medicine

## 2021-09-07 ENCOUNTER — Other Ambulatory Visit: Payer: Self-pay

## 2021-09-07 DIAGNOSIS — I2510 Atherosclerotic heart disease of native coronary artery without angina pectoris: Secondary | ICD-10-CM | POA: Diagnosis not present

## 2021-09-07 DIAGNOSIS — E1169 Type 2 diabetes mellitus with other specified complication: Secondary | ICD-10-CM | POA: Diagnosis not present

## 2021-09-07 DIAGNOSIS — E669 Obesity, unspecified: Secondary | ICD-10-CM

## 2021-09-07 DIAGNOSIS — R42 Dizziness and giddiness: Secondary | ICD-10-CM

## 2021-09-07 DIAGNOSIS — G250 Essential tremor: Secondary | ICD-10-CM

## 2021-09-07 DIAGNOSIS — R944 Abnormal results of kidney function studies: Secondary | ICD-10-CM

## 2021-09-07 MED ORDER — ENALAPRIL MALEATE 5 MG PO TABS: 2.5000 mg | ORAL_TABLET | Freq: Two times a day (BID) | ORAL | 3 refills | Status: DC

## 2021-09-07 NOTE — Assessment & Plan Note (Signed)
Cont on Lipitor, ASA ?

## 2021-09-07 NOTE — Assessment & Plan Note (Signed)
Monitor GFR 

## 2021-09-07 NOTE — Assessment & Plan Note (Signed)
Glipizide, Metformin ?

## 2021-09-07 NOTE — Assessment & Plan Note (Signed)
Reduce or d/c Enalapril ?

## 2021-09-07 NOTE — Assessment & Plan Note (Signed)
Better on prn Propranolol ?

## 2021-09-07 NOTE — Progress Notes (Signed)
? ?Subjective:  ?Patient ID: [name] [name]., male    DOB: 02-23-1951  Age: 71 y.o. MRN: [id number] ? ?CC: No chief complaint on file. ? ? ?HPI ?[name] Sachs Mcelhannon Jr. presents for DM, HTN, CAD, tremors ? ?Outpatient Medications Prior to Visit  ?Medication Sig Dispense Refill  ? ACCU-CHEK GUIDE test strip USE TO TEST BLOOD SUGAR FIVE TIMES A DAY. DX: E11.69 450 strip 2  ? aspirin EC 81 MG tablet Take 1 tablet (81 mg total) by mouth daily. Swallow whole. 90 tablet 3  ? atorvastatin (LIPITOR) 80 MG tablet TAKE 1 TABLET BY MOUTH EVERY DAY 90 tablet 2  ? Blood Glucose Monitoring Suppl (ACCU-CHEK GUIDE ME) w/Device KIT 1 Units by Does not apply route 5 (five) times daily. 1 kit 1  ? buPROPion (WELLBUTRIN XL) 150 MG 24 hr tablet TAKE 1 TABLET BY MOUTH EVERY DAY 90 tablet 2  ? Cholecalciferol 1000 UNITS tablet Take 1 tablet (1,000 Units total) by mouth daily. 200 tablet 3  ? diphenhydrAMINE (BENADRYL) 25 MG tablet Take 1-2 tablets (25-50 mg total) by mouth every 8 (eight) hours as needed for allergies. 60 tablet 5  ? glipiZIDE (GLUCOTROL XL) 5 MG 24 hr tablet TAKE 2 TABLETS BY MOUT IN AM OR ONE TWICE A DAY 180 tablet 2  ? ibuprofen (ADVIL) 600 MG tablet TAKE 1 TABLET (600 MG TOTAL) BY MOUTH EVERY 4 (FOUR) HOURS AS NEEDED. FOR PAIN 30 tablet 0  ? indomethacin (INDOCIN SR) 75 MG CR capsule TAKE 1 CAPSULE BY MOUTH TWICE A DAY WITH MEAL 60 capsule 1  ? JARDIANCE 10 MG TABS tablet TAKE 1 TABLET BY MOUTH EVERY DAY 90 tablet 2  ? Lancets (ONETOUCH DELICA PLUS [id number]) MISC USE TO HELP CHECK BLOOD SUGARS 5 TIMES A DAY 500 each 3  ? meclizine (ANTIVERT) 12.5 MG tablet TAKE 1 TABLET (12.5 MG TOTAL) BY MOUTH 3 (THREE) TIMES DAILY AS NEEDED. 90 tablet 1  ? metFORMIN (GLUCOPHAGE) 1000 MG tablet TAKE 1 TABLET BY MOUTH TWICE A DAY WITH MEAL 180 tablet 2  ? niacin (NIASPAN) 500 MG CR tablet TAKE 1 TABLET BY MOUTH AT BEDTIME. 90 tablet 3  ? OMEGA 3-6-9 FATTY ACIDS PO Take 1 tablet by mouth daily.    ? propranolol (INDERAL) 10 MG  tablet Take 1-2 tablets (10-20 mg total) by mouth daily as needed. 60 tablet 3  ? silodosin (RAPAFLO) 8 MG CAPS capsule TAKE 1 CAPSULE BY MOUTH EVERY DAY 90 capsule 2  ? valACYclovir (VALTREX) 500 MG tablet TAKE 1 TABLET BY MOUTH THREE TIMES A DAY 21 tablet 2  ? enalapril (VASOTEC) 5 MG tablet TAKE 2 TABLETS BY MOUTH EVERY DAY. 180 tablet 2  ? LORazepam (ATIVAN) 0.5 MG tablet Take 1 tablet (0.5 mg total) by mouth 2 (two) times daily as needed for anxiety (tremot). 60 tablet 1  ? nitroGLYCERIN (NITROSTAT) 0.4 MG SL tablet PLACE 1 TABLET UNDER THE TOUNGE EVERY 5 MINUTES AS NEEDED FOR CHEST PAIN 25 tablet 1  ? tadalafil (CIALIS) 5 MG tablet TAKE ONE TABLET DAILY FOR BPH 90 tablet 2  ? ?No facility-administered medications prior to visit.  ? ? ?ROS: ?Review of Systems  ?Constitutional:  Negative for appetite change, fatigue and unexpected weight change.  ?HENT:  Negative for congestion, nosebleeds, sneezing, sore throat and trouble swallowing.   ?Eyes:  Negative for itching and visual disturbance.  ?Respiratory:  Negative for cough.   ?Cardiovascular:  Negative for chest pain, palpitations and leg swelling.  ?  Gastrointestinal:  Negative for abdominal distention, blood in stool, diarrhea and nausea.  ?Genitourinary:  Negative for frequency and hematuria.  ?Musculoskeletal:  Negative for back pain, gait problem, joint swelling and neck pain.  ?Skin:  Negative for rash.  ?Neurological:  Negative for dizziness, tremors, speech difficulty and weakness.  ?Psychiatric/Behavioral:  Negative for agitation, dysphoric mood and sleep disturbance. The patient is not nervous/anxious.   ? ?Objective:  ?BP 124/62 (BP Location: Left Arm, Patient Position: Sitting, Cuff Size: Large)   Pulse 70   Temp 98.2 ?F (36.8 ?C) (Oral)   Ht _0  (1.854 m)   Wt 198 lb (89.8 kg)   SpO2 96%   BMI 26.12 kg/m?  ? ?BP Readings from Last 3 Encounters:  ?09/07/21 124/62  ?06/05/21 120/62  ?05/08/21 120/64  ? ? ?Wt Readings from Last 3 Encounters:   ?09/07/21 198 lb (89.8 kg)  ?06/13/21 199 lb 12.8 oz (90.6 kg)  ?06/05/21 198 lb 6.4 oz (90 kg)  ? ? ?Physical Exam ?Constitutional:   ?   General: He is not in acute distress. ?   Appearance: He is well-developed.  ?   Comments: NAD  ?Eyes:  ?   Conjunctiva/sclera: Conjunctivae normal.  ?   Pupils: Pupils are equal, round, and reactive to light.  ?Neck:  ?   Thyroid: No thyromegaly.  ?   Vascular: No JVD.  ?Cardiovascular:  ?   Rate and Rhythm: Normal rate and regular rhythm.  ?   Heart sounds: Normal heart sounds. No murmur heard. ?  No friction rub. No gallop.  ?Pulmonary:  ?   Effort: Pulmonary effort is normal. No respiratory distress.  ?   Breath sounds: Normal breath sounds. No wheezing or rales.  ?Chest:  ?   Chest wall: No tenderness.  ?Abdominal:  ?   General: Bowel sounds are normal. There is no distension.  ?   Palpations: Abdomen is soft. There is no mass.  ?   Tenderness: There is no abdominal tenderness. There is no guarding or rebound.  ?Musculoskeletal:     ?   General: No tenderness. Normal range of motion.  ?   Cervical back: Normal range of motion.  ?Lymphadenopathy:  ?   Cervical: No cervical adenopathy.  ?Skin: ?   General: Skin is warm and dry.  ?   Findings: No rash.  ?Neurological:  ?   Mental Status: He is alert and oriented to person, place, and time.  ?   Cranial Nerves: No cranial nerve deficit.  ?   Motor: No abnormal muscle tone.  ?   Coordination: Coordination normal.  ?   Gait: Gait normal.  ?   Deep Tendon Reflexes: Reflexes are normal and symmetric.  ?Psychiatric:     ?   Behavior: Behavior normal.     ?   Thought Content: Thought content normal.     ?   Judgment: Judgment normal.  ? ?Lightheaded when stood up ? ? ?Lab Results  ?Component Value Date  ? WBC 6.8 04/13/2020  ? HGB 15.4 04/13/2020  ? HCT 44.9 04/13/2020  ? PLT 177.0 04/13/2020  ? GLUCOSE 118 (H) 08/31/2021  ? CHOL 116 08/31/2021  ? TRIG 148.0 08/31/2021  ? HDL 36.10 (L) 08/31/2021  ? LDLDIRECT 71.0 03/05/2019  ?  LDLCALC 51 08/31/2021  ? ALT 21 08/31/2021  ? AST 19 08/31/2021  ? NA 140 08/31/2021  ? K 4.0 08/31/2021  ? CL 105 08/31/2021  ? CREATININE 1.14 08/31/2021  ?  BUN 19 08/31/2021  ? CO2 26 08/31/2021  ? TSH 1.97 08/31/2021  ? PSA 0.80 08/31/2021  ? INR 1.3 04/08/2008  ? HGBA1C 6.5 08/31/2021  ? MICROALBUR 9.5 (H) 08/31/2021  ? ? ?US RENAL ? ?Result Date: 12/09/2020 ?CLINICAL DATA:  Decreased GFR. EXAM: RENAL / URINARY TRACT ULTRASOUND COMPLETE COMPARISON:  None. FINDINGS: Right Kidney: Renal measurements: 12.0 x 5.3 x 5.1 cm = volume: 168 mL. Echogenicity within normal limits. No mass or hydronephrosis visualized. Left Kidney: Renal measurements: 12.3 x 5.5 x 4.8 cm = volume: 169 mL. Echogenicity within normal limits. No mass or hydronephrosis visualized. Bladder: Appears normal for degree of bladder distention. Bilateral ureteral jets are noted. Other: Mild prostatic enlargement is noted. IMPRESSION: Kidneys are unremarkable.  Mild prostatic enlargement. Electronically Signed   By: Marijo Conception M.D.   On: 12/09/2020 15:00  ? ? ?Assessment & Plan:  ? ?Problem List Items Addressed This Visit   ? ? CAD (coronary artery disease)  ?  Cont on Lipitor, ASA ?  ?  ? Relevant Medications  ? enalapril (VASOTEC) 5 MG tablet  ? Decreased GFR  ?  Monitor GFR ?  ?  ? Diabetes mellitus type 2 in obese [facility])  ?  Glipizide, Metformin ?  ?  ? Relevant Medications  ? enalapril (VASOTEC) 5 MG tablet  ? Other Relevant Orders  ? Hemoglobin A1c  ? Comprehensive metabolic panel  ? Essential tremor  ?  Better on prn Propranolol ?  ?  ? Orthostatic dizziness  ?  Reduce or d/c Enalapril ?  ?  ?  ? ? ?Meds ordered this encounter  ?Medications  ? enalapril (VASOTEC) 5 MG tablet  ?  Sig: Take 0.5 tablets (2.5 mg total) by mouth 2 (two) times daily.  ?  Dispense:  90 tablet  ?  Refill:  3  ?  ? ? ?Follow-up: Return in about 3 months (around 12/08/2021) for a follow-up visit. ? ?Walker Kehr, MD ?

## 2021-09-19 ENCOUNTER — Other Ambulatory Visit: Payer: Self-pay | Admitting: Internal Medicine

## 2021-09-26 ENCOUNTER — Other Ambulatory Visit: Payer: Self-pay | Admitting: Internal Medicine

## 2021-09-28 ENCOUNTER — Other Ambulatory Visit: Payer: Self-pay | Admitting: Internal Medicine

## 2021-12-04 ENCOUNTER — Other Ambulatory Visit (INDEPENDENT_AMBULATORY_CARE_PROVIDER_SITE_OTHER): Payer: Medicare Other

## 2021-12-04 DIAGNOSIS — E119 Type 2 diabetes mellitus without complications: Secondary | ICD-10-CM

## 2021-12-04 LAB — BASIC METABOLIC PANEL
BUN: 17 mg/dL (ref 6–23)
CO2: 28 mEq/L (ref 19–32)
Calcium: 9.3 mg/dL (ref 8.4–10.5)
Chloride: 105 mEq/L (ref 96–112)
Creatinine, Ser: 1.27 mg/dL (ref 0.40–1.50)
GFR: 57 mL/min — ABNORMAL LOW (ref >60.00)
Glucose, Bld: 111 mg/dL — ABNORMAL HIGH (ref 70–99)
Potassium: 4 mEq/L (ref 3.5–5.1)
Sodium: 142 mEq/L (ref 135–145)

## 2021-12-04 LAB — HEMOGLOBIN A1C: Hgb A1c MFr Bld: 6.4 % (ref 4.6–6.5)

## 2021-12-11 ENCOUNTER — Ambulatory Visit (INDEPENDENT_AMBULATORY_CARE_PROVIDER_SITE_OTHER): Payer: Medicare Other | Admitting: Internal Medicine

## 2021-12-11 ENCOUNTER — Encounter: Payer: Self-pay | Admitting: Internal Medicine

## 2021-12-11 DIAGNOSIS — E1169 Type 2 diabetes mellitus with other specified complication: Secondary | ICD-10-CM | POA: Diagnosis not present

## 2021-12-11 DIAGNOSIS — I152 Hypertension secondary to endocrine disorders: Secondary | ICD-10-CM

## 2021-12-11 DIAGNOSIS — I2510 Atherosclerotic heart disease of native coronary artery without angina pectoris: Secondary | ICD-10-CM | POA: Diagnosis not present

## 2021-12-11 DIAGNOSIS — M545 Low back pain, unspecified: Secondary | ICD-10-CM | POA: Diagnosis not present

## 2021-12-11 DIAGNOSIS — E669 Obesity, unspecified: Secondary | ICD-10-CM

## 2021-12-11 DIAGNOSIS — R944 Abnormal results of kidney function studies: Secondary | ICD-10-CM | POA: Diagnosis not present

## 2021-12-11 DIAGNOSIS — E1159 Type 2 diabetes mellitus with other circulatory complications: Secondary | ICD-10-CM | POA: Diagnosis not present

## 2021-12-11 NOTE — Assessment & Plan Note (Signed)
Cont on Glipizide, Metformin, Jardiance  

## 2021-12-11 NOTE — Assessment & Plan Note (Signed)
Blue-Emu cream was recommended to use 2-3 times a day ? ?

## 2021-12-11 NOTE — Patient Instructions (Signed)
Blue-Emu cream -- use 2-3 times a day ? ?

## 2021-12-11 NOTE — Assessment & Plan Note (Signed)
Cont on Enalapril 

## 2021-12-11 NOTE — Assessment & Plan Note (Signed)
Cont on Lipitor, ASA

## 2021-12-11 NOTE — Progress Notes (Signed)
Subjective:  Patient ID: [name] [name]., male    DOB: 02-27-1951  Age: 71 y.o. MRN: [id number]  CC: No chief complaint on file.   HPI [name] [name] [employer]. presents for DM, HTN, dyslipidemia  Outpatient Medications Prior to Visit  Medication Sig Dispense Refill   ACCU-CHEK GUIDE test strip USE TO TEST BLOOD SUGAR FIVE TIMES A DAY. DX: E11.69 450 strip 2   aspirin EC 81 MG tablet Take 1 tablet (81 mg total) by mouth daily. Swallow whole. 90 tablet 3   atorvastatin (LIPITOR) 80 MG tablet TAKE 1 TABLET BY MOUTH EVERY DAY 90 tablet 2   Blood Glucose Monitoring Suppl (ACCU-CHEK GUIDE ME) w/Device KIT 1 Units by Does not apply route 5 (five) times daily. 1 kit 1   buPROPion (WELLBUTRIN XL) 150 MG 24 hr tablet TAKE 1 TABLET BY MOUTH EVERY DAY 90 tablet 2   Cholecalciferol 1000 UNITS tablet Take 1 tablet (1,000 Units total) by mouth daily. 200 tablet 3   diphenhydrAMINE (BENADRYL) 25 MG tablet Take 1-2 tablets (25-50 mg total) by mouth every 8 (eight) hours as needed for allergies. 60 tablet 5   enalapril (VASOTEC) 5 MG tablet TAKE 2 TABLETS BY MOUTH EVERY DAY 180 tablet 2   glipiZIDE (GLUCOTROL XL) 5 MG 24 hr tablet TAKE 2 TABLETS BY MOUT IN AM OR ONE TWICE A DAY 180 tablet 2   ibuprofen (ADVIL) 600 MG tablet TAKE 1 TABLET (600 MG TOTAL) BY MOUTH EVERY 4 (FOUR) HOURS AS NEEDED. FOR PAIN 30 tablet 0   indomethacin (INDOCIN SR) 75 MG CR capsule TAKE 1 CAPSULE BY MOUTH TWICE A DAY WITH MEAL 60 capsule 1   JARDIANCE 10 MG TABS tablet TAKE 1 TABLET BY MOUTH EVERY DAY 90 tablet 2   Lancets (ONETOUCH DELICA PLUS [id number]) MISC USE TO HELP CHECK BLOOD SUGARS 5 TIMES A DAY 500 each 3   meclizine (ANTIVERT) 12.5 MG tablet TAKE 1 TABLET (12.5 MG TOTAL) BY MOUTH 3 (THREE) TIMES DAILY AS NEEDED. 90 tablet 1   metFORMIN (GLUCOPHAGE) 1000 MG tablet TAKE 1 TABLET BY MOUTH TWICE A DAY WITH MEAL 180 tablet 2   niacin (NIASPAN) 500 MG CR tablet TAKE 1 TABLET BY MOUTH AT BEDTIME. 90 tablet 3   OMEGA  3-6-9 FATTY ACIDS PO Take 1 tablet by mouth daily.     propranolol (INDERAL) 10 MG tablet TAKE 1-2 TABLETS (10-20 MG TOTAL) BY MOUTH DAILY AS NEEDED. 180 tablet 1   silodosin (RAPAFLO) 8 MG CAPS capsule TAKE 1 CAPSULE BY MOUTH EVERY DAY 90 capsule 2   tadalafil (CIALIS) 5 MG tablet TAKE ONE TABLET DAILY FOR BPH *$25 AT [facility] OR [facility] WITH SAME GOOD RX* 90 tablet 2   valACYclovir (VALTREX) 500 MG tablet TAKE 1 TABLET BY MOUTH THREE TIMES A DAY 21 tablet 2   No facility-administered medications prior to visit.    ROS: Review of Systems  Constitutional:  Negative for appetite change, fatigue and unexpected weight change.  HENT:  Negative for congestion, nosebleeds, sneezing, sore throat and trouble swallowing.   Eyes:  Negative for itching and visual disturbance.  Respiratory:  Negative for cough.   Cardiovascular:  Negative for chest pain, palpitations and leg swelling.  Gastrointestinal:  Negative for abdominal distention, blood in stool, diarrhea and nausea.  Genitourinary:  Negative for frequency and hematuria.  Musculoskeletal:  Negative for back pain, gait problem, joint swelling and neck pain.  Skin:  Negative for rash.  Neurological:  Negative  for dizziness, tremors, speech difficulty and weakness.  Psychiatric/Behavioral:  Negative for agitation, dysphoric mood and sleep disturbance. The patient is not nervous/anxious.     Objective:  BP 124/70 (BP Location: Left Arm, Patient Position: Sitting, Cuff Size: Large)   Pulse 62   Temp 98.2 F (36.8 C) (Oral)   Ht _0  (1.854 m)   Wt 194 lb (88 kg)   SpO2 96%   BMI 25.60 kg/m   BP Readings from Last 3 Encounters:  12/11/21 124/70  09/07/21 124/62  06/05/21 120/62    Wt Readings from Last 3 Encounters:  12/11/21 194 lb (88 kg)  09/07/21 198 lb (89.8 kg)  06/13/21 199 lb 12.8 oz (90.6 kg)    Physical Exam Constitutional:      General: He is not in acute distress.    Appearance: He is well-developed.     Comments:  NAD  Eyes:     Conjunctiva/sclera: Conjunctivae normal.     Pupils: Pupils are equal, round, and reactive to light.  Neck:     Thyroid: No thyromegaly.     Vascular: No JVD.  Cardiovascular:     Rate and Rhythm: Normal rate and regular rhythm.     Heart sounds: Normal heart sounds. No murmur heard.    No friction rub. No gallop.  Pulmonary:     Effort: Pulmonary effort is normal. No respiratory distress.     Breath sounds: Normal breath sounds. No wheezing or rales.  Chest:     Chest wall: No tenderness.  Abdominal:     General: Bowel sounds are normal. There is no distension.     Palpations: Abdomen is soft. There is no mass.     Tenderness: There is no abdominal tenderness. There is no guarding or rebound.  Musculoskeletal:        General: No tenderness. Normal range of motion.     Cervical back: Normal range of motion.  Lymphadenopathy:     Cervical: No cervical adenopathy.  Skin:    General: Skin is warm and dry.     Findings: No rash.  Neurological:     Mental Status: He is alert and oriented to person, place, and time.     Cranial Nerves: No cranial nerve deficit.     Motor: No abnormal muscle tone.     Coordination: Coordination normal.     Gait: Gait normal.     Deep Tendon Reflexes: Reflexes are normal and symmetric.  Psychiatric:        Behavior: Behavior normal.        Thought Content: Thought content normal.        Judgment: Judgment normal.     Lab Results  Component Value Date   WBC 6.8 04/13/2020   HGB 15.4 04/13/2020   HCT 44.9 04/13/2020   PLT 177.0 04/13/2020   GLUCOSE 111 (H) 12/04/2021   CHOL 116 08/31/2021   TRIG 148.0 08/31/2021   HDL 36.10 (L) 08/31/2021   LDLDIRECT 71.0 03/05/2019   LDLCALC 51 08/31/2021   ALT 21 08/31/2021   AST 19 08/31/2021   NA 142 12/04/2021   K 4.0 12/04/2021   CL 105 12/04/2021   CREATININE 1.27 12/04/2021   BUN 17 12/04/2021   CO2 28 12/04/2021   TSH 1.97 08/31/2021   PSA 0.80 08/31/2021   INR 1.3  04/08/2008   HGBA1C 6.4 12/04/2021   MICROALBUR 9.5 (H) 08/31/2021    US RENAL  Result Date: 12/09/2020 CLINICAL DATA:  Decreased GFR.  EXAM: RENAL / URINARY TRACT ULTRASOUND COMPLETE COMPARISON:  None. FINDINGS: Right Kidney: Renal measurements: 12.0 x 5.3 x 5.1 cm = volume: 168 mL. Echogenicity within normal limits. No mass or hydronephrosis visualized. Left Kidney: Renal measurements: 12.3 x 5.5 x 4.8 cm = volume: 169 mL. Echogenicity within normal limits. No mass or hydronephrosis visualized. Bladder: Appears normal for degree of bladder distention. Bilateral ureteral jets are noted. Other: Mild prostatic enlargement is noted. IMPRESSION: Kidneys are unremarkable.  Mild prostatic enlargement. Electronically Signed   By: Marijo Conception M.D.   On: 12/09/2020 15:00    Assessment & Plan:   Problem List Items Addressed This Visit     CAD (coronary artery disease)    Cont on Lipitor, ASA      Relevant Orders   Comprehensive metabolic panel   Hemoglobin A1c   Decreased GFR    Renal US Hydrate well No NSAIDs      Relevant Orders   Comprehensive metabolic panel   Hemoglobin A1c   Diabetes mellitus type 2 in obese (HCC)    Cont on Glipizide, Metformin, Jardiance       Relevant Orders   Comprehensive metabolic panel   Hemoglobin A1c   Hypertension associated with diabetes ([facility])    Cont on Enalapril      Relevant Orders   Comprehensive metabolic panel   Hemoglobin A1c   Lumbar back pain    Blue-Emu cream was recommended to use 2-3 times a day         No orders of the defined types were placed in this encounter.     Follow-up: Return in about 3 months (around 03/13/2022) for a follow-up visit.  Walker Kehr, MD

## 2021-12-11 NOTE — Assessment & Plan Note (Signed)
Renal US Hydrate well No NSAIDs

## 2022-03-07 ENCOUNTER — Other Ambulatory Visit (INDEPENDENT_AMBULATORY_CARE_PROVIDER_SITE_OTHER): Payer: Medicare Other

## 2022-03-07 DIAGNOSIS — I2510 Atherosclerotic heart disease of native coronary artery without angina pectoris: Secondary | ICD-10-CM | POA: Diagnosis not present

## 2022-03-07 DIAGNOSIS — E1159 Type 2 diabetes mellitus with other circulatory complications: Secondary | ICD-10-CM

## 2022-03-07 DIAGNOSIS — I152 Hypertension secondary to endocrine disorders: Secondary | ICD-10-CM

## 2022-03-07 DIAGNOSIS — R944 Abnormal results of kidney function studies: Secondary | ICD-10-CM | POA: Diagnosis not present

## 2022-03-07 DIAGNOSIS — E1169 Type 2 diabetes mellitus with other specified complication: Secondary | ICD-10-CM

## 2022-03-07 DIAGNOSIS — E669 Obesity, unspecified: Secondary | ICD-10-CM

## 2022-03-07 LAB — COMPREHENSIVE METABOLIC PANEL
ALT: 19 U/L (ref 0–53)
AST: 18 U/L (ref 0–37)
Albumin: 4.3 g/dL (ref 3.5–5.2)
Alkaline Phosphatase: 48 U/L (ref 39–117)
BUN: 18 mg/dL (ref 6–23)
CO2: 30 mEq/L (ref 19–32)
Calcium: 9.8 mg/dL (ref 8.4–10.5)
Chloride: 102 mEq/L (ref 96–112)
Creatinine, Ser: 1.18 mg/dL (ref 0.40–1.50)
GFR: 62.15 mL/min (ref >60.00)
Glucose, Bld: 112 mg/dL — ABNORMAL HIGH (ref 70–99)
Potassium: 4 mEq/L (ref 3.5–5.1)
Sodium: 141 mEq/L (ref 135–145)
Total Bilirubin: 1 mg/dL (ref 0.2–1.2)
Total Protein: 6.6 g/dL (ref 6.0–8.3)

## 2022-03-07 LAB — HEMOGLOBIN A1C: Hgb A1c MFr Bld: 6.4 % (ref 4.6–6.5)

## 2022-03-12 ENCOUNTER — Other Ambulatory Visit: Payer: Self-pay | Admitting: Internal Medicine

## 2022-03-14 ENCOUNTER — Ambulatory Visit (INDEPENDENT_AMBULATORY_CARE_PROVIDER_SITE_OTHER): Payer: Medicare Other | Admitting: Internal Medicine

## 2022-03-14 ENCOUNTER — Encounter: Payer: Self-pay | Admitting: Internal Medicine

## 2022-03-14 ENCOUNTER — Other Ambulatory Visit: Payer: Self-pay | Admitting: Internal Medicine

## 2022-03-14 VITALS — BP 122/58 | HR 61 | Temp 98.1°F | Ht 73.0 in | Wt 195.4 lb

## 2022-03-14 DIAGNOSIS — E1169 Type 2 diabetes mellitus with other specified complication: Secondary | ICD-10-CM

## 2022-03-14 DIAGNOSIS — N401 Enlarged prostate with lower urinary tract symptoms: Secondary | ICD-10-CM

## 2022-03-14 DIAGNOSIS — R350 Frequency of micturition: Secondary | ICD-10-CM | POA: Diagnosis not present

## 2022-03-14 DIAGNOSIS — I10 Essential (primary) hypertension: Secondary | ICD-10-CM | POA: Diagnosis not present

## 2022-03-14 DIAGNOSIS — I152 Hypertension secondary to endocrine disorders: Secondary | ICD-10-CM

## 2022-03-14 DIAGNOSIS — Z23 Encounter for immunization: Secondary | ICD-10-CM

## 2022-03-14 DIAGNOSIS — E669 Obesity, unspecified: Secondary | ICD-10-CM

## 2022-03-14 DIAGNOSIS — E1159 Type 2 diabetes mellitus with other circulatory complications: Secondary | ICD-10-CM | POA: Diagnosis not present

## 2022-03-14 MED ORDER — TADALAFIL 5 MG PO TABS: ORAL_TABLET | ORAL | 3 refills | Status: DC

## 2022-03-14 MED ORDER — INDOMETHACIN ER 75 MG PO CPCR: ORAL_CAPSULE | ORAL | 1 refills | Status: AC

## 2022-03-14 MED ORDER — MECLIZINE HCL 12.5 MG PO TABS: 12.5000 mg | ORAL_TABLET | Freq: Three times a day (TID) | ORAL | 1 refills | Status: DC | PRN

## 2022-03-14 NOTE — Assessment & Plan Note (Signed)
On Cialis 5 mg/d

## 2022-03-14 NOTE — Assessment & Plan Note (Signed)
Cont on Glipizide, Metformin, Jardiance  

## 2022-03-14 NOTE — Progress Notes (Signed)
Subjective:  Patient ID: [name] [name]., male    DOB: 1951-01-24  Age: 71 y.o. MRN: [id number]  CC: Follow-up (3 month f/u)   HPI [name] Sachs [employer]. presents for DM, HTN, CAD  Outpatient Medications Prior to Visit  Medication Sig Dispense Refill   ACCU-CHEK GUIDE test strip USE TO TEST BLOOD SUGAR FIVE TIMES A DAY. DX: E11.69 450 strip 2   aspirin EC 81 MG tablet Take 1 tablet (81 mg total) by mouth daily. Swallow whole. 90 tablet 3   atorvastatin (LIPITOR) 80 MG tablet TAKE 1 TABLET BY MOUTH EVERY DAY 90 tablet 2   atovaquone-proguanil (MALARONE) 250-100 MG TABS tablet TAKE AS DIRECTED 60 tablet 4   Blood Glucose Monitoring Suppl (ACCU-CHEK GUIDE ME) w/Device KIT 1 Units by Does not apply route 5 (five) times daily. 1 kit 1   buPROPion (WELLBUTRIN XL) 150 MG 24 hr tablet TAKE 1 TABLET BY MOUTH EVERY DAY 90 tablet 2   Cholecalciferol 1000 UNITS tablet Take 1 tablet (1,000 Units total) by mouth daily. 200 tablet 3   diphenhydrAMINE (BENADRYL) 25 MG tablet Take 1-2 tablets (25-50 mg total) by mouth every 8 (eight) hours as needed for allergies. 60 tablet 5   enalapril (VASOTEC) 5 MG tablet TAKE 2 TABLETS BY MOUTH EVERY DAY 180 tablet 2   glipiZIDE (GLUCOTROL XL) 5 MG 24 hr tablet TAKE 2 TABLETS BY MOUT IN AM OR ONE TWICE A DAY 180 tablet 2   ibuprofen (ADVIL) 600 MG tablet TAKE 1 TABLET (600 MG TOTAL) BY MOUTH EVERY 4 (FOUR) HOURS AS NEEDED. FOR PAIN 30 tablet 0   JARDIANCE 10 MG TABS tablet TAKE 1 TABLET BY MOUTH EVERY DAY 90 tablet 2   Lancets (ONETOUCH DELICA PLUS [id number]) MISC USE TO HELP CHECK BLOOD SUGARS 5 TIMES A DAY 500 each 3   metFORMIN (GLUCOPHAGE) 1000 MG tablet TAKE 1 TABLET BY MOUTH TWICE A DAY WITH MEAL 180 tablet 2   niacin (VITAMIN B3) 500 MG ER tablet TAKE 1 TABLET BY MOUTH EVERYDAY AT BEDTIME 90 tablet 3   OMEGA 3-6-9 FATTY ACIDS PO Take 1 tablet by mouth daily.     propranolol (INDERAL) 10 MG tablet TAKE 1 TO 2 TABLETS BY MOUTH EVERY DAY AS NEEDED 180  tablet 1   silodosin (RAPAFLO) 8 MG CAPS capsule TAKE 1 CAPSULE BY MOUTH EVERY DAY 90 capsule 2   valACYclovir (VALTREX) 500 MG tablet TAKE 1 TABLET BY MOUTH THREE TIMES A DAY 21 tablet 2   indomethacin (INDOCIN SR) 75 MG CR capsule TAKE 1 CAPSULE BY MOUTH TWICE A DAY WITH MEAL 60 capsule 1   meclizine (ANTIVERT) 12.5 MG tablet TAKE 1 TABLET (12.5 MG TOTAL) BY MOUTH 3 (THREE) TIMES DAILY AS NEEDED. 90 tablet 1   tadalafil (CIALIS) 5 MG tablet TAKE ONE TABLET DAILY FOR BPH *$25 AT [facility] OR [facility] WITH SAME GOOD RX* 90 tablet 2   No facility-administered medications prior to visit.    ROS: Review of Systems  Constitutional:  Negative for appetite change, fatigue and unexpected weight change.  HENT:  Negative for congestion, nosebleeds, sneezing, sore throat and trouble swallowing.   Eyes:  Negative for itching and visual disturbance.  Respiratory:  Negative for cough.   Cardiovascular:  Negative for chest pain, palpitations and leg swelling.  Gastrointestinal:  Negative for abdominal distention, blood in stool, diarrhea and nausea.  Genitourinary:  Negative for frequency and hematuria.  Musculoskeletal:  Negative for back pain, gait problem,  joint swelling and neck pain.  Skin:  Negative for rash.  Neurological:  Negative for dizziness, tremors, speech difficulty and weakness.  Psychiatric/Behavioral:  Negative for agitation, dysphoric mood and sleep disturbance. The patient is not nervous/anxious.     Objective:  BP (!) 122/58 (BP Location: Left Arm)   Pulse 61   Temp 98.1 F (36.7 C) (Oral)   Ht _0  (1.854 m)   Wt 195 lb 6.4 oz (88.6 kg)   SpO2 96%   BMI 25.78 kg/m   BP Readings from Last 3 Encounters:  03/14/22 (!) 122/58  12/11/21 124/70  09/07/21 124/62    Wt Readings from Last 3 Encounters:  03/14/22 195 lb 6.4 oz (88.6 kg)  12/11/21 194 lb (88 kg)  09/07/21 198 lb (89.8 kg)    Physical Exam Constitutional:      General: He is not in acute distress.     Appearance: He is well-developed. He is obese.     Comments: NAD  Eyes:     Conjunctiva/sclera: Conjunctivae normal.     Pupils: Pupils are equal, round, and reactive to light.  Neck:     Thyroid: No thyromegaly.     Vascular: No JVD.  Cardiovascular:     Rate and Rhythm: Normal rate and regular rhythm.     Heart sounds: Normal heart sounds. No murmur heard.    No friction rub. No gallop.  Pulmonary:     Effort: Pulmonary effort is normal. No respiratory distress.     Breath sounds: Normal breath sounds. No wheezing or rales.  Chest:     Chest wall: No tenderness.  Abdominal:     General: Bowel sounds are normal. There is no distension.     Palpations: Abdomen is soft. There is no mass.     Tenderness: There is no abdominal tenderness. There is no guarding or rebound.  Musculoskeletal:        General: No tenderness. Normal range of motion.     Cervical back: Normal range of motion.  Lymphadenopathy:     Cervical: No cervical adenopathy.  Skin:    General: Skin is warm and dry.     Findings: No rash.  Neurological:     Mental Status: He is alert and oriented to person, place, and time.     Cranial Nerves: No cranial nerve deficit.     Motor: No abnormal muscle tone.     Coordination: Coordination normal.     Gait: Gait normal.     Deep Tendon Reflexes: Reflexes are normal and symmetric.  Psychiatric:        Behavior: Behavior normal.        Thought Content: Thought content normal.        Judgment: Judgment normal.     Lab Results  Component Value Date   WBC 6.8 04/13/2020   HGB 15.4 04/13/2020   HCT 44.9 04/13/2020   PLT 177.0 04/13/2020   GLUCOSE 112 (H) 03/07/2022   CHOL 116 08/31/2021   TRIG 148.0 08/31/2021   HDL 36.10 (L) 08/31/2021   LDLDIRECT 71.0 03/05/2019   LDLCALC 51 08/31/2021   ALT 19 03/07/2022   AST 18 03/07/2022   NA 141 03/07/2022   K 4.0 03/07/2022   CL 102 03/07/2022   CREATININE 1.18 03/07/2022   BUN 18 03/07/2022   CO2 30 03/07/2022    TSH 1.97 08/31/2021   PSA 0.80 08/31/2021   INR 1.3 04/08/2008   HGBA1C 6.4 03/07/2022   MICROALBUR 9.5 (  H) 08/31/2021    US RENAL  Result Date: 12/09/2020 CLINICAL DATA:  Decreased GFR. EXAM: RENAL / URINARY TRACT ULTRASOUND COMPLETE COMPARISON:  None. FINDINGS: Right Kidney: Renal measurements: 12.0 x 5.3 x 5.1 cm = volume: 168 mL. Echogenicity within normal limits. No mass or hydronephrosis visualized. Left Kidney: Renal measurements: 12.3 x 5.5 x 4.8 cm = volume: 169 mL. Echogenicity within normal limits. No mass or hydronephrosis visualized. Bladder: Appears normal for degree of bladder distention. Bilateral ureteral jets are noted. Other: Mild prostatic enlargement is noted. IMPRESSION: Kidneys are unremarkable.  Mild prostatic enlargement. Electronically Signed   By: Marijo Conception M.D.   On: 12/09/2020 15:00    Assessment & Plan:   Problem List Items Addressed This Visit   None Visit Diagnoses     Needs flu shot    -  Primary   Relevant Orders   Flu Vaccine QUAD High Dose(Fluad) (Completed)         Meds ordered this encounter  Medications   indomethacin (INDOCIN SR) 75 MG CR capsule    Sig: TAKE 1 CAPSULE BY MOUTH TWICE A DAY WITH MEAL    Dispense:  60 capsule    Refill:  1   meclizine (ANTIVERT) 12.5 MG tablet    Sig: Take 1 tablet (12.5 mg total) by mouth 3 (three) times daily as needed.    Dispense:  90 tablet    Refill:  1   tadalafil (CIALIS) 5 MG tablet    Sig: TAKE ONE TABLET DAILY FOR BPH    Dispense:  90 tablet    Refill:  3      Follow-up: Return in about 3 months (around 06/13/2022) for a follow-up visit.  Walker Kehr, MD

## 2022-03-14 NOTE — Assessment & Plan Note (Signed)
Cont on Enalapril

## 2022-03-29 DIAGNOSIS — H40003 Preglaucoma, unspecified, bilateral: Secondary | ICD-10-CM | POA: Diagnosis not present

## 2022-03-29 DIAGNOSIS — H5203 Hypermetropia, bilateral: Secondary | ICD-10-CM | POA: Diagnosis not present

## 2022-03-29 DIAGNOSIS — H524 Presbyopia: Secondary | ICD-10-CM | POA: Diagnosis not present

## 2022-03-29 DIAGNOSIS — E119 Type 2 diabetes mellitus without complications: Secondary | ICD-10-CM | POA: Diagnosis not present

## 2022-03-29 DIAGNOSIS — H52223 Regular astigmatism, bilateral: Secondary | ICD-10-CM | POA: Diagnosis not present

## 2022-03-29 DIAGNOSIS — H40013 Open angle with borderline findings, low risk, bilateral: Secondary | ICD-10-CM | POA: Diagnosis not present

## 2022-05-09 ENCOUNTER — Telehealth: Payer: Self-pay | Admitting: *Deleted

## 2022-05-09 NOTE — Telephone Encounter (Signed)
Pt wife was in the office this am asking for written rx's on medications. Per chart husband has refills 90 days on all scripts. Called wife to find out what medications he is needing. No answer and can't leave vm due to its being full.Marland KitchenJohny [name]

## 2022-05-09 NOTE — Telephone Encounter (Signed)
Called wife pt states she is wanting written scripts on meds so she can take to different pharmacy to get cheaper.Marland KitchenJohny Chess

## 2022-05-14 NOTE — Telephone Encounter (Signed)
Pt has appt 12/19 will give at that time if needing any strips.Marland KitchenJohny Chess

## 2022-05-15 NOTE — Telephone Encounter (Signed)
Noted! Thank you

## 2022-06-01 ENCOUNTER — Other Ambulatory Visit: Payer: Self-pay | Admitting: Internal Medicine

## 2022-06-06 ENCOUNTER — Other Ambulatory Visit (INDEPENDENT_AMBULATORY_CARE_PROVIDER_SITE_OTHER): Payer: Medicare Other

## 2022-06-06 DIAGNOSIS — E1169 Type 2 diabetes mellitus with other specified complication: Secondary | ICD-10-CM

## 2022-06-06 DIAGNOSIS — E669 Obesity, unspecified: Secondary | ICD-10-CM

## 2022-06-06 LAB — COMPREHENSIVE METABOLIC PANEL
ALT: 20 U/L (ref 0–53)
AST: 18 U/L (ref 0–37)
Albumin: 4.2 g/dL (ref 3.5–5.2)
Alkaline Phosphatase: 47 U/L (ref 39–117)
BUN: 19 mg/dL (ref 6–23)
CO2: 29 mEq/L (ref 19–32)
Calcium: 9.2 mg/dL (ref 8.4–10.5)
Chloride: 103 mEq/L (ref 96–112)
Creatinine, Ser: 1.22 mg/dL (ref 0.40–1.50)
GFR: 59.6 mL/min — ABNORMAL LOW (ref >60.00)
Glucose, Bld: 136 mg/dL — ABNORMAL HIGH (ref 70–99)
Potassium: 3.9 mEq/L (ref 3.5–5.1)
Sodium: 141 mEq/L (ref 135–145)
Total Bilirubin: 1 mg/dL (ref 0.2–1.2)
Total Protein: 6.3 g/dL (ref 6.0–8.3)

## 2022-06-06 LAB — HEMOGLOBIN A1C: Hgb A1c MFr Bld: 6.6 % — ABNORMAL HIGH (ref 4.6–6.5)

## 2022-06-11 ENCOUNTER — Ambulatory Visit: Payer: Medicare Other | Attending: Cardiology | Admitting: Cardiology

## 2022-06-11 ENCOUNTER — Encounter: Payer: Self-pay | Admitting: *Deleted

## 2022-06-11 ENCOUNTER — Encounter: Payer: Self-pay | Admitting: Cardiology

## 2022-06-11 ENCOUNTER — Telehealth (HOSPITAL_COMMUNITY): Payer: Self-pay | Admitting: *Deleted

## 2022-06-11 VITALS — BP 142/40 | HR 63 | Ht 73.0 in | Wt 195.4 lb

## 2022-06-11 DIAGNOSIS — R011 Cardiac murmur, unspecified: Secondary | ICD-10-CM | POA: Diagnosis not present

## 2022-06-11 DIAGNOSIS — E782 Mixed hyperlipidemia: Secondary | ICD-10-CM

## 2022-06-11 DIAGNOSIS — R079 Chest pain, unspecified: Secondary | ICD-10-CM

## 2022-06-11 DIAGNOSIS — G250 Essential tremor: Secondary | ICD-10-CM | POA: Diagnosis not present

## 2022-06-11 DIAGNOSIS — I2510 Atherosclerotic heart disease of native coronary artery without angina pectoris: Secondary | ICD-10-CM | POA: Diagnosis not present

## 2022-06-11 NOTE — Progress Notes (Signed)
Cardiology Office Note:    Date:  06/11/2022   ID:  [name] Sachs Whalin Jr., DOB 1950-07-09, MRN [id number]  PCP:  Cassandria Anger, MD  [facility] Cardiologist:  Candee Furbish, MD  [facility] Electrophysiologist:  None   Referring MD: Cassandria Anger, MD    History of Present Illness:    [name] Varnell Kichline [name] is a 71 y.o. male here for follow-up of coronary artery disease, diabetes, hypertension, status post CABG in 2009.  Had normal EF, normal nuclear stress test in both 2011 and 2017.  No ischemia.  Back in 2017 had an episode of going to the bathroom at night getting back in bed and catching his breath, bending over with dyspnea.  Had chest pain that was rare off and on and had a stress test that was reassuring.  He is feeling this type of chest discomfort once again.  Prior to CABG had a nuclear stress test that was abnormal when he had colicky type pain from his gallbladder.  Had a silent MI.  He reports that occurred after post hole digging.  Felt very short winded.  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 At night when get back in bed mild left sided CP a few minutes once again.  He has needed to take the propanolol on Sunday mornings. He works as a Investment banker, corporate and the medication helps manage his tremors which allows him to play and conduct.   No fevers chills nausea vomiting.  No significant shortness of breath.    Past Medical History:  Diagnosis Date   Aortic root dilatation ([place])    slight, echo 2009   Arthritis    WRISTS, HIP   Blood transfusion without reported diagnosis    CAD (coronary artery disease)    Nuclear, Jul 04, 2009 NO ischemia   Cataract    Diabetes mellitus    Diverticulosis    Heart murmur    Herpes simplex    Hx of CABG 04/01/2008   October, 2009   Hyperlipidemia    Hypertension    Kidney cysts    ~09   Low testosterone 07/02/2008   LOW DHEA   Microalbuminuria     Snoring    prior history of surgery for sleep apnea    Past Surgical History:  Procedure Laterality Date   APPENDECTOMY  07/02/1976   CHOLECYSTECTOMY  07/03/2007   COLONOSCOPY     CORONARY ARTERY BYPASS GRAFT  07/03/2007   POLYPECTOMY     Removal of UVULA     ENT   TONSILECTOMY, ADENOIDECTOMY, BILATERAL MYRINGOTOMY AND TUBES     NO PE TUBES PER PT - NOSE SHAVED AS WELL    Current Medications: Current Meds  Medication Sig   ACCU-CHEK GUIDE test strip USE TO TEST BLOOD SUGAR FIVE TIMES A DAY. DX: E11.69   aspirin EC 81 MG tablet Take 1 tablet (81 mg total) by mouth daily. Swallow whole.   atorvastatin (LIPITOR) 80 MG tablet TAKE 1 TABLET BY MOUTH EVERY DAY   atovaquone-proguanil (MALARONE) 250-100 MG TABS tablet TAKE AS DIRECTED   Blood Glucose Monitoring Suppl (ACCU-CHEK GUIDE ME) w/Device KIT 1 Units by Does not apply route 5 (five) times daily.   buPROPion (WELLBUTRIN XL) 150 MG 24 hr tablet TAKE 1 TABLET BY MOUTH EVERY DAY   Cholecalciferol 1000 UNITS tablet Take 1 tablet (1,000 Units total) by mouth daily.   diphenhydrAMINE (BENADRYL) 25 MG tablet Take 1-2 tablets (25-50  mg total) by mouth every 8 (eight) hours as needed for allergies.   enalapril (VASOTEC) 5 MG tablet TAKE 2 TABLETS BY MOUTH EVERY DAY   glipiZIDE (GLUCOTROL XL) 5 MG 24 hr tablet TAKE 2 TABLETS BY MOUT IN AM OR ONE TWICE A DAY   ibuprofen (ADVIL) 600 MG tablet TAKE 1 TABLET (600 MG TOTAL) BY MOUTH EVERY 4 (FOUR) HOURS AS NEEDED. FOR PAIN   indomethacin (INDOCIN SR) 75 MG CR capsule TAKE 1 CAPSULE BY MOUTH TWICE A DAY WITH MEAL   JARDIANCE 10 MG TABS tablet TAKE 1 TABLET BY MOUTH EVERY DAY   Lancets (ONETOUCH DELICA PLUS [id number]) MISC USE TO HELP CHECK BLOOD SUGARS 5 TIMES A DAY   meclizine (ANTIVERT) 12.5 MG tablet Take 1 tablet (12.5 mg total) by mouth 3 (three) times daily as needed.   metFORMIN (GLUCOPHAGE) 1000 MG tablet TAKE 1 TABLET BY MOUTH TWICE A DAY WITH MEAL   niacin (VITAMIN B3) 500 MG ER tablet  TAKE 1 TABLET BY MOUTH EVERYDAY AT BEDTIME   OMEGA 3-6-9 FATTY ACIDS PO Take 1 tablet by mouth daily.   propranolol (INDERAL) 10 MG tablet TAKE 1 TO 2 TABLETS BY MOUTH EVERY DAY AS NEEDED   silodosin (RAPAFLO) 8 MG CAPS capsule TAKE 1 CAPSULE BY MOUTH EVERY DAY   tadalafil (CIALIS) 5 MG tablet TAKE ONE TABLET DAILY FOR BPH   valACYclovir (VALTREX) 500 MG tablet TAKE 1 TABLET BY MOUTH THREE TIMES A DAY     Allergies:   Iohexol and Sulfadiazine   Social History   Socioeconomic History   Marital status: Married    Spouse name: Not on file   Number of children: 3   Years of education: Not on file   Highest education level: Master's degree (e.g., MA, MS, MEng, MEd, MSW, MBA)  Occupational History   Not on file  Tobacco Use   Smoking status: Never   Smokeless tobacco: Never  Substance and Sexual Activity   Alcohol use: No    Alcohol/week: 0.0 standard drinks of alcohol   Drug use: No   Sexual activity: Yes  Other Topics Concern   Not on file  Social History Narrative   Travels a lotRegular exercise - Yes, 4,000-6,000 steps daily   Social Determinants of Health   Financial Resource Strain: Low Risk  (06/13/2021)   Overall Financial Resource Strain (CARDIA)    Difficulty of Paying Living Expenses: Not hard at all  Food Insecurity: No Food Insecurity (06/13/2021)   Hunger Vital Sign    Worried About Running Out of Food in the Last Year: Never true    Ran Out of Food in the Last Year: Never true  Transportation Needs: No Transportation Needs (06/13/2021)   PRAPARE - Hydrologist (Medical): No    Lack of Transportation (Non-Medical): No  Physical Activity: Sufficiently Active (06/13/2021)   Exercise Vital Sign    Days of Exercise per Week: 5 days    Minutes of Exercise per Session: 30 min  Stress: No Stress Concern Present (06/13/2021)   [place]    Feeling of Stress : Not at all   Social Connections: [place] (06/13/2021)   Social Connection and Isolation Panel [NHANES]    Frequency of Communication with Friends and Family: More than three times a week    Frequency of Social Gatherings with Friends and Family: Once a week    Attends Religious Services: More than  4 times per year    Active Member of Clubs or Organizations: Yes    Attends Music therapist: More than 4 times per year    Marital Status: Married     Family History: The patient's family history includes Diabetes in his father and mother; Heart disease in his father; Hypertension in an other family member; Mental illness (age of onset: 69) in his mother. There is no history of Colon cancer, Colon polyps, Esophageal cancer, Rectal cancer, or Stomach cancer. Father 38 alive.    ROS:   Please see the history of present illness.    (+) Tremors (+) Chest pain  All other systems reviewed and are negative.  EKGs/Labs/Other Studies Reviewed:    The following studies were reviewed today:  Myocardial Lexiscan Stress Test 02/07/16 Nuclear stress EF: 58%. No T wave inversion was noted during stress. There was no ST segment deviation noted during stress. This is a low risk study. No reversible ischemia. PVC's noted. LVEF 58% with basal to mid septal dyskinesis. This is a low risk study.  EKG:  EKG was personally reviewed 06/11/2022-sinus rhythm first-degree AV block 63 bpm with 212 ms PR interval.  Old anterior infarct pattern. 05/08/21: Sinus rhythm, rate 65 bpm; first degree AV block 02/05/2020-sinus rhythm 79 borderline first-degree AV block with left axis deviation.  PR interval 214 ms 01/29/17-sinus rhythm first degree AV block PR interval 218 ms with poor R wave progression personally viewed 01/30/16-sinus rhythm, old inferior infarct pattern, heart rate 81 bpm, PR interval 208 ms. No ST segment changes.    Recent Labs: 08/31/2021: TSH 1.97 06/06/2022: ALT 20; BUN 19; Creatinine, Ser  1.22; Potassium 3.9; Sodium 141  Recent Lipid Panel    Component Value Date/Time   CHOL 116 08/31/2021 0726   TRIG 148.0 08/31/2021 0726   TRIG 270 (HH) 07/09/2006 1702   HDL 36.10 (L) 08/31/2021 0726   CHOLHDL 3 08/31/2021 0726   VLDL 29.6 08/31/2021 0726   LDLCALC 51 08/31/2021 0726   LDLDIRECT 71.0 03/05/2019 0714    Physical Exam:    VS:  BP (!) 142/40   Pulse 63   Ht _0  (1.854 m)   Wt 195 lb 6.4 oz (88.6 kg)   SpO2 96%   BMI 25.78 kg/m     Wt Readings from Last 3 Encounters:  06/11/22 195 lb 6.4 oz (88.6 kg)  03/14/22 195 lb 6.4 oz (88.6 kg)  12/11/21 194 lb (88 kg)     GEN:  Well nourished, well developed in no acute distress HEENT: Normal NECK: No JVD; No carotid bruits LYMPHATICS: No lymphadenopathy CARDIAC: RRR, 1/6 diastolic murmur,no rubs, gallops,  CABG scar RESPIRATORY:  Clear to auscultation without rales, wheezing or rhonchi  ABDOMEN: Soft, non-tender, non-distended MUSCULOSKELETAL:  No edema; No deformity  SKIN: Warm and dry NEUROLOGIC:  Alert and oriented x 3 PSYCHIATRIC:  Normal affect   ASSESSMENT:    1. Coronary artery disease involving native coronary artery of native heart without angina pectoris   2. Mixed hyperlipidemia   3. Essential tremor   4. Chest pain of uncertain etiology   5. Murmur, cardiac      PLAN:    Chest pain of uncertain etiology Occasional discomfort central.  Sometimes in his system and gets up in the middle of the night for the bathroom.  He is not noticing this during exertional activity during the day.  He mentioned this to me at prior appointment.  It has not gone away.  We will go ahead and check a nuclear stress test, Lexiscan since it has been since 2017.  We want to ensure that he does not have any evidence of high risk ischemia.   Mixed hyperlipidemia Doing well on high intensity statin atorvastatin 80 mg once a day.  Last LDL 53.  ALT 14.  Excellent.  No changes made.  At goal.   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  This helps him out quite significantly.  He also has low-dose lorazepam as well.  Did not mention this.  He does have a mild first-degree AV block noted on ECG.  I am comfortable with him however taking his as needed propranolol.   Diabetes mellitus type 2 in obese [facility]) Happy that he is taking Jardiance in the setting of cardiovascular disease.  Hemoglobin A1c of 6.6.  Dr. Alain Marion.  No changes.   CAD (coronary artery disease) Checking Lexiscan.  On high intensity statin as well as aspirin 81 mg.  Prior CABG.  Heart murmur/wide pulse pressure I do believe I hear a soft diastolic murmur.  I want to make sure he does not have significant aortic regurgitation.  His wide pulse pressure may just be secondary to arterial stiffness.  In order of problems listed above:  Medication Adjustments/Labs and Tests Ordered: Current medicines are reviewed at length with the patient today.  Concerns regarding medicines are outlined above.  Orders Placed This Encounter  Procedures   MYOCARDIAL PERFUSION IMAGING   EKG 12-Lead   ECHOCARDIOGRAM COMPLETE    No orders of the defined types were placed in this encounter.    Patient Instructions  Medication Instructions:  The current medical regimen is effective;  continue present plan and medications.  *If you need a refill on your cardiac medications before your next appointment, please call your pharmacy*  Testing/Procedures: Your physician has requested that you have an echocardiogram. Echocardiography is a painless test that uses sound waves to create images of your heart. It provides your doctor with information about the size and shape of your heart and how well your heart's chambers and valves are working. This procedure takes approximately one hour. There are no restrictions for this procedure. Please do NOT wear cologne, perfume, aftershave, or  lotions (deodorant is allowed). Please arrive 15 minutes prior to your appointment time.  Your physician has requested that you have a lexiscan myoview. For further information please visit [email or web address]. Please follow instruction sheet, as given.   Follow-Up: At [facility], you and your health needs are our priority.  As part of our continuing mission to provide you with exceptional heart care, we have created designated Provider Care Teams.  These Care Teams include your primary Cardiologist (physician) and Advanced Practice Providers (APPs -  Physician Assistants and Nurse Practitioners) who all work together to provide you with the care you need, when you need it.  We recommend signing up for the patient portal called "MyChart".  Sign up information is provided on this After Visit Summary.  MyChart is used to connect with patients for Virtual Visits (Telemedicine).  Patients are able to view lab/test results, encounter notes, upcoming appointments, etc.  Non-urgent messages can be sent to your provider as well.   To learn more about what you can do with MyChart, go to [email or web address].    Your next appointment:   1 year(s)  The format for your next appointment:   In Person  Provider:   Candee Furbish, MD      Important Information About Sugar           Signed, Candee Furbish, MD  06/11/2022 12:19 PM    [place]

## 2022-06-11 NOTE — Telephone Encounter (Signed)
Patient given detailed instructions per Myocardial Perfusion Study Information Sheet for the test on 06/13/22 at 0715. Patient notified to arrive 15 minutes early and that it is imperative to arrive on time for appointment to keep from having the test rescheduled.  If you need to cancel or reschedule your appointment, please call the office within 24 hours of your appointment. . Patient verbalized understanding.Traniece Boffa, Ranae Palms

## 2022-06-11 NOTE — Patient Instructions (Signed)
Medication Instructions:  The current medical regimen is effective;  continue present plan and medications.  *If you need a refill on your cardiac medications before your next appointment, please call your pharmacy*  Testing/Procedures: Your physician has requested that you have an echocardiogram. Echocardiography is a painless test that uses sound waves to create images of your heart. It provides your doctor with information about the size and shape of your heart and how well your heart's chambers and valves are working. This procedure takes approximately one hour. There are no restrictions for this procedure. Please do NOT wear cologne, perfume, aftershave, or lotions (deodorant is allowed). Please arrive 15 minutes prior to your appointment time.  Your physician has requested that you have a lexiscan myoview. For further information please visit [email or web address]. Please follow instruction sheet, as given.   Follow-Up: At [facility], you and your health needs are our priority.  As part of our continuing mission to provide you with exceptional heart care, we have created designated Provider Care Teams.  These Care Teams include your primary Cardiologist (physician) and Advanced Practice Providers (APPs -  Physician Assistants and Nurse Practitioners) who all work together to provide you with the care you need, when you need it.  We recommend signing up for the patient portal called "MyChart".  Sign up information is provided on this After Visit Summary.  MyChart is used to connect with patients for Virtual Visits (Telemedicine).  Patients are able to view lab/test results, encounter notes, upcoming appointments, etc.  Non-urgent messages can be sent to your provider as well.   To learn more about what you can do with MyChart, go to [email or web address].    Your next appointment:   1 year(s)  The format for your next appointment:   In Person  Provider:   Candee Furbish, MD       Important Information About Sugar

## 2022-06-13 ENCOUNTER — Ambulatory Visit (HOSPITAL_COMMUNITY): Payer: Medicare Other | Attending: Cardiology

## 2022-06-13 DIAGNOSIS — R079 Chest pain, unspecified: Secondary | ICD-10-CM | POA: Diagnosis not present

## 2022-06-13 LAB — MYOCARDIAL PERFUSION IMAGING
LV dias vol: 98 mL (ref 62–150)
LV sys vol: 40 mL
Nuc Stress EF: 60 %
Peak HR: 81 {beats}/min
Rest HR: 52 {beats}/min
Rest Nuclear Isotope Dose: 10.9 mCi
SDS: 0
SRS: 0
SSS: 0
ST Depression (mm): 0 mm
Stress Nuclear Isotope Dose: 32.5 mCi
TID: 1.14

## 2022-06-13 MED ORDER — TECHNETIUM TC 99M TETROFOSMIN IV KIT
10.9000 | PACK | Freq: Once | INTRAVENOUS | Status: AC | PRN
  Administered 2022-06-13: 10.9 via INTRAVENOUS

## 2022-06-13 MED ORDER — TECHNETIUM TC 99M TETROFOSMIN IV KIT
32.5000 | PACK | Freq: Once | INTRAVENOUS | Status: AC | PRN
  Administered 2022-06-13: 32.5 via INTRAVENOUS

## 2022-06-13 MED ORDER — REGADENOSON 0.4 MG/5ML IV SOLN
0.4000 mg | Freq: Once | INTRAVENOUS | Status: AC
  Administered 2022-06-13: 0.4 mg via INTRAVENOUS

## 2022-06-14 ENCOUNTER — Ambulatory Visit (HOSPITAL_COMMUNITY): Payer: Medicare Other | Attending: Cardiology

## 2022-06-14 DIAGNOSIS — E785 Hyperlipidemia, unspecified: Secondary | ICD-10-CM | POA: Insufficient documentation

## 2022-06-14 DIAGNOSIS — Z951 Presence of aortocoronary bypass graft: Secondary | ICD-10-CM | POA: Insufficient documentation

## 2022-06-14 DIAGNOSIS — R079 Chest pain, unspecified: Secondary | ICD-10-CM | POA: Diagnosis not present

## 2022-06-14 DIAGNOSIS — I351 Nonrheumatic aortic (valve) insufficiency: Secondary | ICD-10-CM | POA: Diagnosis not present

## 2022-06-14 DIAGNOSIS — E119 Type 2 diabetes mellitus without complications: Secondary | ICD-10-CM | POA: Insufficient documentation

## 2022-06-14 DIAGNOSIS — I10 Essential (primary) hypertension: Secondary | ICD-10-CM | POA: Diagnosis not present

## 2022-06-14 DIAGNOSIS — R011 Cardiac murmur, unspecified: Secondary | ICD-10-CM

## 2022-06-14 LAB — ECHOCARDIOGRAM COMPLETE
Area-P 1/2: 4.21 cm2
P 1/2 time: 418 msec
S' Lateral: 3.1 cm

## 2022-06-15 ENCOUNTER — Other Ambulatory Visit: Payer: Self-pay | Admitting: *Deleted

## 2022-06-15 DIAGNOSIS — R011 Cardiac murmur, unspecified: Secondary | ICD-10-CM

## 2022-06-15 DIAGNOSIS — I77810 Thoracic aortic ectasia: Secondary | ICD-10-CM

## 2022-06-19 ENCOUNTER — Ambulatory Visit (INDEPENDENT_AMBULATORY_CARE_PROVIDER_SITE_OTHER): Payer: Medicare Other | Admitting: Internal Medicine

## 2022-06-19 ENCOUNTER — Encounter: Payer: Self-pay | Admitting: Internal Medicine

## 2022-06-19 VITALS — BP 138/62 | HR 61 | Temp 98.8°F | Ht 73.0 in | Wt 194.6 lb

## 2022-06-19 DIAGNOSIS — I2510 Atherosclerotic heart disease of native coronary artery without angina pectoris: Secondary | ICD-10-CM

## 2022-06-19 DIAGNOSIS — Z23 Encounter for immunization: Secondary | ICD-10-CM

## 2022-06-19 DIAGNOSIS — Z0000 Encounter for general adult medical examination without abnormal findings: Secondary | ICD-10-CM

## 2022-06-19 DIAGNOSIS — E1169 Type 2 diabetes mellitus with other specified complication: Secondary | ICD-10-CM | POA: Diagnosis not present

## 2022-06-19 DIAGNOSIS — I152 Hypertension secondary to endocrine disorders: Secondary | ICD-10-CM | POA: Diagnosis not present

## 2022-06-19 DIAGNOSIS — E669 Obesity, unspecified: Secondary | ICD-10-CM

## 2022-06-19 DIAGNOSIS — E1159 Type 2 diabetes mellitus with other circulatory complications: Secondary | ICD-10-CM

## 2022-06-19 NOTE — Assessment & Plan Note (Signed)
Chronic  Cont on Enalapril

## 2022-06-19 NOTE — Progress Notes (Signed)
Subjective:  Patient ID: [name] Sachs Galbreath [name], male    DOB: [birth year]  Age: 71 y.o. MRN: [id number]  CC: Follow-up (3 month f/u)   HPI [name] [name]. presents for DM, HTN, CAD   Outpatient Medications Prior to Visit  Medication Sig Dispense Refill   ACCU-CHEK GUIDE test strip USE TO TEST BLOOD SUGAR FIVE TIMES A DAY. DX: E11.69 450 strip 2   aspirin EC 81 MG tablet Take 1 tablet (81 mg total) by mouth daily. Swallow whole. 90 tablet 3   atorvastatin (LIPITOR) 80 MG tablet TAKE 1 TABLET BY MOUTH EVERY DAY 90 tablet 2   Blood Glucose Monitoring Suppl (ACCU-CHEK GUIDE ME) w/Device KIT 1 Units by Does not apply route 5 (five) times daily. 1 kit 1   buPROPion (WELLBUTRIN XL) 150 MG 24 hr tablet TAKE 1 TABLET BY MOUTH EVERY DAY 90 tablet 2   Cholecalciferol 1000 UNITS tablet Take 1 tablet (1,000 Units total) by mouth daily. 200 tablet 3   enalapril (VASOTEC) 5 MG tablet TAKE 2 TABLETS BY MOUTH EVERY DAY 180 tablet 2   glipiZIDE (GLUCOTROL XL) 5 MG 24 hr tablet TAKE 2 TABLETS BY MOUT IN AM OR ONE TWICE A DAY 180 tablet 2   ibuprofen (ADVIL) 600 MG tablet TAKE 1 TABLET (600 MG TOTAL) BY MOUTH EVERY 4 (FOUR) HOURS AS NEEDED. FOR PAIN 30 tablet 0   JARDIANCE 10 MG TABS tablet TAKE 1 TABLET BY MOUTH EVERY DAY 90 tablet 2   Lancets (ONETOUCH DELICA PLUS [id number]) MISC USE TO HELP CHECK BLOOD SUGARS 5 TIMES A DAY 500 each 3   meclizine (ANTIVERT) 12.5 MG tablet Take 1 tablet (12.5 mg total) by mouth 3 (three) times daily as needed. 90 tablet 1   metFORMIN (GLUCOPHAGE) 1000 MG tablet TAKE 1 TABLET BY MOUTH TWICE A DAY WITH MEAL 180 tablet 2   niacin (VITAMIN B3) 500 MG ER tablet TAKE 1 TABLET BY MOUTH EVERYDAY AT BEDTIME 90 tablet 3   OMEGA 3-6-9 FATTY ACIDS PO Take 1 tablet by mouth daily.     silodosin (RAPAFLO) 8 MG CAPS capsule TAKE 1 CAPSULE BY MOUTH EVERY DAY 90 capsule 2   tadalafil (CIALIS) 5 MG tablet TAKE ONE TABLET DAILY FOR BPH 90 tablet 2   atovaquone-proguanil (MALARONE)  250-100 MG TABS tablet TAKE AS DIRECTED (Patient not taking: Reported on 06/19/2022) 60 tablet 4   diphenhydrAMINE (BENADRYL) 25 MG tablet Take 1-2 tablets (25-50 mg total) by mouth every 8 (eight) hours as needed for allergies. (Patient not taking: Reported on 06/19/2022) 60 tablet 5   indomethacin (INDOCIN SR) 75 MG CR capsule TAKE 1 CAPSULE BY MOUTH TWICE A DAY WITH MEAL (Patient not taking: Reported on 06/19/2022) 60 capsule 1   propranolol (INDERAL) 10 MG tablet TAKE 1 TO 2 TABLETS BY MOUTH EVERY DAY AS NEEDED (Patient not taking: Reported on 06/19/2022) 180 tablet 1   valACYclovir (VALTREX) 500 MG tablet TAKE 1 TABLET BY MOUTH THREE TIMES A DAY (Patient not taking: Reported on 06/19/2022) 21 tablet 2   No facility-administered medications prior to visit.    ROS: Review of Systems  Constitutional:  Negative for appetite change, fatigue and unexpected weight change.  HENT:  Negative for congestion, nosebleeds, sneezing, sore throat and trouble swallowing.   Eyes:  Negative for itching and visual disturbance.  Respiratory:  Negative for cough.   Cardiovascular:  Negative for chest pain, palpitations and leg swelling.  Gastrointestinal:  Negative for abdominal distention, blood  in stool, diarrhea and nausea.  Genitourinary:  Negative for frequency and hematuria.  Musculoskeletal:  Negative for back pain, gait problem, joint swelling and neck pain.  Skin:  Negative for rash.  Neurological:  Negative for dizziness, tremors, speech difficulty and weakness.  Psychiatric/Behavioral:  Negative for agitation, dysphoric mood and sleep disturbance. The patient is not nervous/anxious.     Objective:  BP 138/62 (BP Location: Left Arm)   Pulse 61   Temp 98.8 F (37.1 C) (Oral)   Ht _0  (1.854 m)   Wt 194 lb 9.6 oz (88.3 kg)   SpO2 97%   BMI 25.67 kg/m   BP Readings from Last 3 Encounters:  06/19/22 138/62  06/11/22 (!) 142/40  03/14/22 (!) 122/58    Wt Readings from Last 3  Encounters:  06/19/22 194 lb 9.6 oz (88.3 kg)  06/13/22 195 lb (88.5 kg)  06/11/22 195 lb 6.4 oz (88.6 kg)    Physical Exam Constitutional:      General: He is not in acute distress.    Appearance: He is well-developed.     Comments: NAD  Eyes:     Conjunctiva/sclera: Conjunctivae normal.     Pupils: Pupils are equal, round, and reactive to light.  Neck:     Thyroid: No thyromegaly.     Vascular: No JVD.  Cardiovascular:     Rate and Rhythm: Normal rate and regular rhythm.     Heart sounds: Normal heart sounds. No murmur heard.    No friction rub. No gallop.  Pulmonary:     Effort: Pulmonary effort is normal. No respiratory distress.     Breath sounds: Normal breath sounds. No wheezing or rales.  Chest:     Chest wall: No tenderness.  Abdominal:     General: Bowel sounds are normal. There is no distension.     Palpations: Abdomen is soft. There is no mass.     Tenderness: There is no abdominal tenderness. There is no guarding or rebound.  Musculoskeletal:        General: No tenderness. Normal range of motion.     Cervical back: Normal range of motion.  Lymphadenopathy:     Cervical: No cervical adenopathy.  Skin:    General: Skin is warm and dry.     Findings: No rash.  Neurological:     Mental Status: He is alert and oriented to person, place, and time.     Cranial Nerves: No cranial nerve deficit.     Motor: No abnormal muscle tone.     Coordination: Coordination normal.     Gait: Gait normal.     Deep Tendon Reflexes: Reflexes are normal and symmetric.  Psychiatric:        Behavior: Behavior normal.        Thought Content: Thought content normal.        Judgment: Judgment normal.     Lab Results  Component Value Date   WBC 6.8 04/13/2020   HGB 15.4 04/13/2020   HCT 44.9 04/13/2020   PLT 177.0 04/13/2020   GLUCOSE 136 (H) 06/06/2022   CHOL 116 08/31/2021   TRIG 148.0 08/31/2021   HDL 36.10 (L) 08/31/2021   LDLDIRECT 71.0 03/05/2019   LDLCALC 51  08/31/2021   ALT 20 06/06/2022   AST 18 06/06/2022   NA 141 06/06/2022   K 3.9 06/06/2022   CL 103 06/06/2022   CREATININE 1.22 06/06/2022   BUN 19 06/06/2022   CO2 29 06/06/2022   TSH  1.97 08/31/2021   PSA 0.80 08/31/2021   INR 1.3 04/08/2008   HGBA1C 6.6 (H) 06/06/2022   MICROALBUR 9.5 (H) 08/31/2021    US RENAL  Result Date: 12/09/2020 CLINICAL DATA:  Decreased GFR. EXAM: RENAL / URINARY TRACT ULTRASOUND COMPLETE COMPARISON:  None. FINDINGS: Right Kidney: Renal measurements: 12.0 x 5.3 x 5.1 cm = volume: 168 mL. Echogenicity within normal limits. No mass or hydronephrosis visualized. Left Kidney: Renal measurements: 12.3 x 5.5 x 4.8 cm = volume: 169 mL. Echogenicity within normal limits. No mass or hydronephrosis visualized. Bladder: Appears normal for degree of bladder distention. Bilateral ureteral jets are noted. Other: Mild prostatic enlargement is noted. IMPRESSION: Kidneys are unremarkable.  Mild prostatic enlargement. Electronically Signed   By: Marijo Conception M.D.   On: 12/09/2020 15:00    Assessment & Plan:   Problem List Items Addressed This Visit     Hypertension associated with diabetes ([place])    Chronic  Cont on Enalapril      Diabetes mellitus type 2 in obese (HCC) - Primary    Cont on Glipizide, Metformin, Jardiance       Relevant Orders   Comprehensive metabolic panel   Hemoglobin A1c   CAD (coronary artery disease)    F/u w/Dr Marlou Porch Cont on Lipitor, ASA Just had an ECHO Stress test was normal         No orders of the defined types were placed in this encounter.     Follow-up: Return in about 3 months (around 09/18/2022) for a follow-up visit.  Walker Kehr, MD

## 2022-06-19 NOTE — Addendum Note (Signed)
Addended by: Basil Dess on: 06/19/2022 08:49 AM   Modules accepted: Orders

## 2022-06-19 NOTE — Assessment & Plan Note (Addendum)
F/u w/Dr Marlou Porch Cont on Lipitor, ASA Just had an ECHO Stress test was normal

## 2022-06-19 NOTE — Assessment & Plan Note (Signed)
Cont on Glipizide, Metformin, Jardiance

## 2022-07-06 ENCOUNTER — Other Ambulatory Visit (HOSPITAL_COMMUNITY): Payer: Medicare Other

## 2022-08-08 ENCOUNTER — Ambulatory Visit (INDEPENDENT_AMBULATORY_CARE_PROVIDER_SITE_OTHER): Payer: Medicare Other

## 2022-08-08 VITALS — Ht 73.0 in | Wt 192.0 lb

## 2022-08-08 DIAGNOSIS — Z0000 Encounter for general adult medical examination without abnormal findings: Secondary | ICD-10-CM

## 2022-08-08 NOTE — Progress Notes (Cosign Needed Addendum)
Virtual Visit via Telephone Note  I connected with  [name] Osantowski Viviano Jr. on 08/08/22 at  2:00 PM EST by telephone and verified that I am speaking with the correct person using two identifiers.  Location: Patient: Home Provider: [place] Persons participating in the virtual visit: [place]   I discussed the limitations, risks, security and privacy concerns of performing an evaluation and management service by telephone and the availability of in person appointments. The patient expressed understanding and agreed to proceed.  Interactive audio and video telecommunications were attempted between this nurse and patient, however failed, due to patient having technical difficulties OR patient did not have access to video capability.  We continued and completed visit with audio only.  Some vital signs may be absent or patient reported.   Sheral Flow, LPN  Subjective:   [name] Talburt Medaglia [name] is a 72 y.o. male who presents for Medicare Annual/Subsequent preventive examination.  Review of Systems     Cardiac Risk Factors include: advanced age (>52mn, >>11women);diabetes mellitus;dyslipidemia;family history of premature cardiovascular disease;hypertension;male gender     Objective:    Today's Vitals   08/08/22 1403  Weight: 192 lb (87.1 kg)  Height: 6' 1"$  (1.854 m)  PainSc: 0-No pain   Body mass index is 25.33 kg/m.     08/08/2022    2:06 PM 06/13/2021    3:15 PM 04/22/2020   10:09 AM 09/27/2015    1:35 PM  Advanced Directives  Does Patient Have a Medical Advance Directive? No Yes Yes No  Type of Advance Directive  Living will;Healthcare Power of Attorney    Does patient want to make changes to medical advance directive?  No - Patient declined No - Patient declined   Copy of [place] Chart?  No - copy requested    Would patient like information on creating a medical advance directive? No - Patient declined        Current Medications (verified) Outpatient Encounter Medications as of 08/08/2022  Medication Sig   ACCU-CHEK GUIDE test strip USE TO TEST BLOOD SUGAR FIVE TIMES A DAY. DX: E11.69   aspirin EC 81 MG tablet Take 1 tablet (81 mg total) by mouth daily. Swallow whole.   atorvastatin (LIPITOR) 80 MG tablet TAKE 1 TABLET BY MOUTH EVERY DAY   atovaquone-proguanil (MALARONE) 250-100 MG TABS tablet TAKE AS DIRECTED (Patient not taking: Reported on 06/19/2022)   Blood Glucose Monitoring Suppl (ACCU-CHEK GUIDE ME) w/Device KIT 1 Units by Does not apply route 5 (five) times daily.   buPROPion (WELLBUTRIN XL) 150 MG 24 hr tablet TAKE 1 TABLET BY MOUTH EVERY DAY   Cholecalciferol 1000 UNITS tablet Take 1 tablet (1,000 Units total) by mouth daily.   diphenhydrAMINE (BENADRYL) 25 MG tablet Take 1-2 tablets (25-50 mg total) by mouth every 8 (eight) hours as needed for allergies. (Patient not taking: Reported on 06/19/2022)   enalapril (VASOTEC) 5 MG tablet TAKE 2 TABLETS BY MOUTH EVERY DAY   glipiZIDE (GLUCOTROL XL) 5 MG 24 hr tablet TAKE 2 TABLETS BY MOUT IN AM OR ONE TWICE A DAY   ibuprofen (ADVIL) 600 MG tablet TAKE 1 TABLET (600 MG TOTAL) BY MOUTH EVERY 4 (FOUR) HOURS AS NEEDED. FOR PAIN   indomethacin (INDOCIN SR) 75 MG CR capsule TAKE 1 CAPSULE BY MOUTH TWICE A DAY WITH MEAL (Patient not taking: Reported on 06/19/2022)   JARDIANCE 10 MG TABS tablet TAKE 1 TABLET BY MOUTH EVERY DAY   Lancets (  ONETOUCH DELICA PLUS [id number]) MISC USE TO HELP CHECK BLOOD SUGARS 5 TIMES A DAY   meclizine (ANTIVERT) 12.5 MG tablet Take 1 tablet (12.5 mg total) by mouth 3 (three) times daily as needed.   metFORMIN (GLUCOPHAGE) 1000 MG tablet TAKE 1 TABLET BY MOUTH TWICE A DAY WITH MEAL   niacin (VITAMIN B3) 500 MG ER tablet TAKE 1 TABLET BY MOUTH EVERYDAY AT BEDTIME   OMEGA 3-6-9 FATTY ACIDS PO Take 1 tablet by mouth daily.   propranolol (INDERAL) 10 MG tablet TAKE 1 TO 2 TABLETS BY MOUTH EVERY DAY AS NEEDED (Patient not  taking: Reported on 06/19/2022)   silodosin (RAPAFLO) 8 MG CAPS capsule TAKE 1 CAPSULE BY MOUTH EVERY DAY   tadalafil (CIALIS) 5 MG tablet TAKE ONE TABLET DAILY FOR BPH   valACYclovir (VALTREX) 500 MG tablet TAKE 1 TABLET BY MOUTH THREE TIMES A DAY (Patient not taking: Reported on 06/19/2022)   No facility-administered encounter medications on file as of 08/08/2022.    Allergies (verified) Iohexol and Sulfadiazine   History: Past Medical History:  Diagnosis Date   Aortic root dilatation (HCC)    slight, echo 2009   Arthritis    WRISTS, HIP   Blood transfusion without reported diagnosis    CAD (coronary artery disease)    Nuclear, Jul 04, 2009 NO ischemia   Cataract    Diabetes mellitus    Diverticulosis    Heart murmur    Herpes simplex    Hx of CABG 04/01/2008   October, 2009   Hyperlipidemia    Hypertension    Kidney cysts    ~09   Low testosterone 07/02/2008   LOW DHEA   Microalbuminuria    Snoring    prior history of surgery for sleep apnea   Past Surgical History:  Procedure Laterality Date   APPENDECTOMY  07/02/1976   CHOLECYSTECTOMY  07/03/2007   COLONOSCOPY     CORONARY ARTERY BYPASS GRAFT  07/03/2007   POLYPECTOMY     Removal of UVULA     ENT   TONSILECTOMY, ADENOIDECTOMY, BILATERAL MYRINGOTOMY AND TUBES     NO PE TUBES PER PT - NOSE SHAVED AS WELL   Family History  Problem Relation Age of Onset   Diabetes Mother    Mental illness Mother 42       Alzheimer   Diabetes Father    Heart disease Father        CAD   Hypertension Other    Colon cancer Neg Hx    Colon polyps Neg Hx    Esophageal cancer Neg Hx    Rectal cancer Neg Hx    Stomach cancer Neg Hx    Social History   Socioeconomic History   Marital status: Married    Spouse name: Not on file   Number of children: 3   Years of education: Not on file   Highest education level: Master's degree (e.g., MA, MS, MEng, MEd, MSW, MBA)  Occupational History   Not on file  Tobacco Use   Smoking  status: Never   Smokeless tobacco: Never  Substance and Sexual Activity   Alcohol use: No    Alcohol/week: 0.0 standard drinks of alcohol   Drug use: No   Sexual activity: Yes  Other Topics Concern   Not on file  Social History Narrative   Travels a lotRegular exercise - Yes, 4,000-6,000 steps daily   Social Determinants of Health   Financial Resource Strain: Low Risk  (08/08/2022)  Overall Financial Resource Strain (CARDIA)    Difficulty of Paying Living Expenses: Not hard at all  Food Insecurity: No Food Insecurity (08/08/2022)   Hunger Vital Sign    Worried About Running Out of Food in the Last Year: Never true    Ran Out of Food in the Last Year: Never true  Transportation Needs: No Transportation Needs (08/08/2022)   PRAPARE - Hydrologist (Medical): No    Lack of Transportation (Non-Medical): No  Physical Activity: Sufficiently Active (08/08/2022)   Exercise Vital Sign    Days of Exercise per Week: 5 days    Minutes of Exercise per Session: 60 min  Stress: No Stress Concern Present (08/08/2022)   [place]    Feeling of Stress : Not at all  Social Connections: [place] (08/08/2022)   Social Connection and Isolation Panel [NHANES]    Frequency of Communication with Friends and Family: More than three times a week    Frequency of Social Gatherings with Friends and Family: Once a week    Attends Religious Services: More than 4 times per year    Active Member of [employer] or Organizations: Yes    Attends Music therapist: More than 4 times per year    Marital Status: Married    Tobacco Counseling Counseling given: Not Answered   Clinical Intake:  Pre-visit preparation completed: Yes  Pain : No/denies pain Pain Score: 0-No pain     BMI - recorded: 25.33 Nutritional Status: BMI 25 -29 Overweight Nutritional Risks: None Diabetes: No  How often do you need  to have someone help you when you read instructions, pamphlets, or other written materials from your doctor or pharmacy?: 1 - Never What is the last grade level you completed in school?: Post Graduate (D[employer] School)  Nutrition Risk Assessment:  Has the patient had any N/V/D within the last 2 months?  No  Does the patient have any non-healing wounds?  No  Has the patient had any unintentional weight loss or weight gain?  No   Diabetes:  Is the patient diabetic?  Yes  If diabetic, was a CBG obtained today?  No  Did the patient bring in their glucometer from home?  No  How often do you monitor your CBG's? Up to 5 times daily.   Financial Strains and Diabetes Management:  Are you having any financial strains with the device, your supplies or your medication? No .  Does the patient want to be seen by Chronic Care Management for management of their diabetes?  No  Would the patient like to be referred to a Nutritionist or for Diabetic Management?  No   Diabetic Exams:  Diabetic Eye Exam: Completed 03/29/2022 Diabetic Foot Exam: Completed 03/14/2022   Interpreter Needed?: No  Information entered by :: Herlinda Heady N. Tephanie Escorcia, LPN.   Activities of Daily Living    08/08/2022    2:13 PM  In your present state of health, do you have any difficulty performing the following activities:  Hearing? 0  Vision? 0  Difficulty concentrating or making decisions? 0  Walking or climbing stairs? 0  Dressing or bathing? 0  Doing errands, shopping? 0  Preparing Food and eating ? N  Using the Toilet? N  In the past six months, have you accidently leaked urine? N  Do you have problems with loss of bowel control? N  Managing your Medications? N  Managing your Finances? N  Housekeeping or managing your Housekeeping? N    Patient Care Team: Plotnikov, Evie Lacks, MD as PCP - General Marlou Porch Thana Farr, MD as PCP - Cardiology (Cardiology) Marica Otter, OD as Consulting Physician (Optometry)  Indicate  any recent Medical Services you may have received from other than [facility] providers in the past year (date may be approximate).     Assessment:   This is a routine wellness examination for [name] [name].  Hearing/Vision screen Hearing Screening - Comments:: Denies hearing difficulties.   Vision Screening - Comments:: Wears rx glasses - up to date with routine eye exams with Marica Otter, OD.   Dietary issues and exercise activities discussed: Current Exercise Habits: Home exercise routine, Type of exercise: walking (4k-10k steps daily), Time (Minutes): 60, Frequency (Times/Week): 5, Weekly Exercise (Minutes/Week): 300, Intensity: Moderate, Exercise limited by: cardiac condition(s)   Goals Addressed   None   Depression Screen    08/08/2022    2:13 PM 06/19/2022    8:06 AM 06/13/2021    3:12 PM 06/05/2021    8:03 AM 04/22/2020   10:08 AM 04/20/2020    8:42 AM 03/16/2019   11:08 AM  PHQ 2/9 Scores  PHQ - 2 Score 0 0 0 0 0 0 0  PHQ- 9 Score 2 1  0       Fall Risk    08/08/2022    2:06 PM 06/19/2022    8:06 AM 06/13/2021    3:17 PM 06/05/2021    8:03 AM 04/22/2020   10:10 AM  [place] in the past year? 0 0 0 0 0  Number falls in past yr: 0 0 0 0 0  Injury with Fall? 0 0 0 0 0  Risk for fall due to : No Fall Risks No Fall Risks No Fall Risks No Fall Risks No Fall Risks  Follow up Falls prevention discussed    Falls evaluation completed    FALL RISK PREVENTION PERTAINING TO THE HOME:  Any stairs in or around the home? No  If so, are there any without handrails? No  Home free of loose throw rugs in walkways, pet beds, electrical cords, etc? Yes  Adequate lighting in your home to reduce risk of falls? Yes   ASSISTIVE DEVICES UTILIZED TO PREVENT FALLS:  Life alert? No  Use of a cane, walker or w/c? No  Grab bars in the bathroom? No  Shower chair or bench in shower? No  Elevated toilet seat or a handicapped toilet? No   TIMED UP AND GO:  Was the test performed? No .  Phone Visit  Cognitive Function:        08/08/2022    2:12 PM  6CIT Screen  What Year? 0 points  What month? 0 points  What time? 0 points  Count back from 20 0 points  Months in reverse 0 points  Repeat phrase 0 points  Total Score 0 points    Immunizations Immunization History  Administered Date(s) Administered   Fluad Quad(high Dose 65+) 03/16/2019, 04/20/2020, 06/05/2021, 03/14/2022   H1N1 06/07/2008   Hep A / Hep B 12/13/2017, 01/14/2018, 06/17/2018   Hepatitis A 05/17/1999   Influenza Split 05/11/2011   Influenza Whole 05/06/2008, 04/01/2009, 03/30/2010, 04/02/2012   Influenza, Quadrivalent, Recombinant, Inj, Pf 03/25/2018   Influenza,inj,Quad PF,6+ Mos 03/10/2013, 05/20/2015   Influenza-Unspecified 03/31/2014, 03/28/2016, 03/27/2017   Meningococcal Mcv4o 12/13/2017   Meningococcal polysaccharide vaccine (MPSV4) 05/24/2010   PFIZER(Purple Top)SARS-COV-2 Vaccination 08/28/2019, 09/22/2019, 04/26/2020   PNEUMOCOCCAL  CONJUGATE-20 06/19/2022   Pneumococcal Conjugate-13 06/16/2013   Pneumococcal Polysaccharide-23 08/03/2016   Td 05/24/2010   Tdap 06/17/2015   Zoster Recombinat (Shingrix) 11/05/2017, 01/05/2018   Zoster, Live 03/10/2013    TDAP status: Up to date  Flu Vaccine status: Up to date  Pneumococcal vaccine status: Up to date  Covid-19 vaccine status: Completed vaccines  Qualifies for Shingles Vaccine? Yes   Zostavax completed Yes   Shingrix Completed?: Yes  Screening Tests Health Maintenance  Topic Date Due   COVID-19 Vaccine (4 - 2023-24 season) 03/02/2022   Diabetic kidney evaluation - Urine ACR  09/01/2022   HEMOGLOBIN A1C  12/06/2022   FOOT EXAM  03/15/2023   OPHTHALMOLOGY EXAM  03/30/2023   Diabetic kidney evaluation - eGFR measurement  06/07/2023   Medicare Annual Wellness (AWV)  08/09/2023   DTaP/Tdap/Td (3 - Td or Tdap) 06/16/2025   COLONOSCOPY (Pts 45-35yr Insurance coverage will need to be confirmed)  02/25/2028   Pneumonia Vaccine  72 Years old  Completed   INFLUENZA VACCINE  Completed   Hepatitis C Screening  Completed   Zoster Vaccines- Shingrix  Completed   HPV VACCINES  Aged Out    Health Maintenance  Health Maintenance Due  Topic Date Due   COVID-19 Vaccine (4 - 2023-24 season) 03/02/2022   Diabetic kidney evaluation - Urine ACR  09/01/2022    Colorectal cancer screening: Type of screening: Colonoscopy. Completed 02/24/2021. Repeat every 7 years  Lung Cancer Screening: (Low Dose CT Chest recommended if Age 72-80years, 30 pack-year currently smoking OR have quit w/in 15years.) does not qualify.   Lung Cancer Screening Referral: no  Additional Screening:  Hepatitis C Screening: does qualify; Completed 12/06/2017  Vision Screening: Recommended annual ophthalmology exams for early detection of glaucoma and other disorders of the eye. Is the patient up to date with their annual eye exam?  Yes  Who is the provider or what is the name of the office in which the patient attends annual eye exams? SMarica Otter OD. If pt is not established with a provider, would they like to be referred to a provider to establish care? No .   Dental Screening: Recommended annual dental exams for proper oral hygiene  Community Resource Referral / Chronic Care Management: CRR required this visit?  No   CCM required this visit?  No      Plan:     I have personally reviewed and noted the following in the patient's chart:   Medical and social history Use of alcohol, tobacco or illicit drugs  Current medications and supplements including opioid prescriptions. Patient is not currently taking opioid prescriptions. Functional ability and status Nutritional status Physical activity Advanced directives List of other physicians Hospitalizations, surgeries, and ER visits in previous 12 months Vitals Screenings to include cognitive, depression, and falls Referrals and appointments  In addition, I have reviewed and discussed  with patient certain preventive protocols, quality metrics, and best practice recommendations. A written personalized care plan for preventive services as well as general preventive health recommendations were provided to patient.     SSheral Flow LPN   [id number]  Nurse Notes: N/A    Medical screening examination/treatment/procedure(s) were performed by non-physician practitioner and as supervising physician I was immediately available for consultation/collaboration.  I agree with above. ALew Dawes MD

## 2022-08-08 NOTE — Patient Instructions (Signed)
[name] [name] , Thank you for taking time to come for your Medicare Wellness Visit. I appreciate your ongoing commitment to your health goals. Please review the following plan we discussed and let me know if I can assist you in the future.   These are the goals we discussed:  Goals       Patient Stated (pt-stated)      My goal is to keep breathing.        This is a list of the screening recommended for you and due dates:  Health Maintenance  Topic Date Due   COVID-19 Vaccine (4 - 2023-24 season) 03/02/2022   Yearly kidney health urinalysis for diabetes  09/01/2022   Hemoglobin A1C  12/06/2022   Complete foot exam   03/15/2023   Eye exam for diabetics  03/30/2023   Yearly kidney function blood test for diabetes  06/07/2023   Medicare Annual Wellness Visit  08/09/2023   DTaP/Tdap/Td vaccine (3 - Td or Tdap) 06/16/2025   Colon Cancer Screening  02/25/2028   Pneumonia Vaccine  Completed   Flu Shot  Completed   Hepatitis C Screening: USPSTF Recommendation to screen - Ages 55-79 yo.  Completed   Zoster (Shingles) Vaccine  Completed   HPV Vaccine  Aged Out    Advanced directives: No  Conditions/risks identified: Yes; Type II Diabetes  Next appointment: Follow up in one year for your annual wellness visit.   Preventive Care 72 Years and Older, Male  Preventive care refers to lifestyle choices and visits with your health care provider that can promote health and wellness. What does preventive care include? A yearly physical exam. This is also called an annual well check. Dental exams once or twice a year. Routine eye exams. Ask your health care provider how often you should have your eyes checked. Personal lifestyle choices, including: Daily care of your teeth and gums. Regular physical activity. Eating a healthy diet. Avoiding tobacco and drug use. Limiting alcohol use. Practicing safe sex. Taking low doses of aspirin every day. Taking vitamin and mineral supplements as  recommended by your health care provider. What happens during an annual well check? The services and screenings done by your health care provider during your annual well check will depend on your age, overall health, lifestyle risk factors, and family history of disease. Counseling  Your health care provider may ask you questions about your: Alcohol use. Tobacco use. Drug use. Emotional well-being. Home and relationship well-being. Sexual activity. Eating habits. History of falls. Memory and ability to understand (cognition). Work and work Statistician. Screening  You may have the following tests or measurements: Height, weight, and BMI. Blood pressure. Lipid and cholesterol levels. These may be checked every 5 years, or more frequently if you are over 55 years old. Skin check. Lung cancer screening. You may have this screening every year starting at age 72 if you have a 30-pack-year history of smoking and currently smoke or have quit within the past 15 years. Fecal occult blood test (FOBT) of the stool. You may have this test every year starting at age 72. Flexible sigmoidoscopy or colonoscopy. You may have a sigmoidoscopy every 5 years or a colonoscopy every 10 years starting at age 72. Prostate cancer screening. Recommendations will vary depending on your family history and other risks. Hepatitis C blood test. Hepatitis B blood test. Sexually transmitted disease (STD) testing. Diabetes screening. This is done by checking your blood sugar (glucose) after you have not eaten for a while (fasting).  You may have this done every 1-3 years. Abdominal aortic aneurysm (AAA) screening. You may need this if you are a current or former smoker. Osteoporosis. You may be screened starting at age 72 if you are at high risk. Talk with your health care provider about your test results, treatment options, and if necessary, the need for more tests. Vaccines  Your health care provider may recommend  certain vaccines, such as: Influenza vaccine. This is recommended every year. Tetanus, diphtheria, and acellular pertussis (Tdap, Td) vaccine. You may need a Td booster every 10 years. Zoster vaccine. You may need this after age 67. Pneumococcal 13-valent conjugate (PCV13) vaccine. One dose is recommended after age 34. Pneumococcal polysaccharide (PPSV23) vaccine. One dose is recommended after age 59. Talk to your health care provider about which screenings and vaccines you need and how often you need them. This information is not intended to replace advice given to you by your health care provider. Make sure you discuss any questions you have with your health care provider. Document Released: 07/15/2015 Document Revised: 03/07/2016 Document Reviewed: 04/19/2015 Elsevier Interactive Patient Education  2017 [place] Prevention in the Home Falls can cause injuries. They can happen to people of all ages. There are many things you can do to make your home safe and to help prevent falls. What can I do on the outside of my home? Regularly fix the edges of walkways and driveways and fix any cracks. Remove anything that might make you trip as you walk through a door, such as a raised step or threshold. Trim any bushes or trees on the path to your home. Use bright outdoor lighting. Clear any walking paths of anything that might make someone trip, such as rocks or tools. Regularly check to see if handrails are loose or broken. Make sure that both sides of any steps have handrails. Any raised decks and porches should have guardrails on the edges. Have any leaves, snow, or ice cleared regularly. Use sand or salt on walking paths during winter. Clean up any spills in your garage right away. This includes oil or grease spills. What can I do in the bathroom? Use night lights. Install grab bars by the toilet and in the tub and shower. Do not use towel bars as grab bars. Use non-skid mats or  decals in the tub or shower. If you need to sit down in the shower, use a plastic, non-slip stool. Keep the floor dry. Clean up any water that spills on the floor as soon as it happens. Remove soap buildup in the tub or shower regularly. Attach bath mats securely with double-sided non-slip rug tape. Do not have throw rugs and other things on the floor that can make you trip. What can I do in the bedroom? Use night lights. Make sure that you have a light by your bed that is easy to reach. Do not use any sheets or blankets that are too big for your bed. They should not hang down onto the floor. Have a firm chair that has side arms. You can use this for support while you get dressed. Do not have throw rugs and other things on the floor that can make you trip. What can I do in the kitchen? Clean up any spills right away. Avoid walking on wet floors. Keep items that you use a lot in easy-to-reach places. If you need to reach something above you, use a strong step stool that has a grab bar. Keep  electrical cords out of the way. Do not use floor polish or wax that makes floors slippery. If you must use wax, use non-skid floor wax. Do not have throw rugs and other things on the floor that can make you trip. What can I do with my stairs? Do not leave any items on the stairs. Make sure that there are handrails on both sides of the stairs and use them. Fix handrails that are broken or loose. Make sure that handrails are as long as the stairways. Check any carpeting to make sure that it is firmly attached to the stairs. Fix any carpet that is loose or worn. Avoid having throw rugs at the top or bottom of the stairs. If you do have throw rugs, attach them to the floor with carpet tape. Make sure that you have a light switch at the top of the stairs and the bottom of the stairs. If you do not have them, ask someone to add them for you. What else can I do to help prevent falls? Wear shoes that: Do not  have high heels. Have rubber bottoms. Are comfortable and fit you well. Are closed at the toe. Do not wear sandals. If you use a stepladder: Make sure that it is fully opened. Do not climb a closed stepladder. Make sure that both sides of the stepladder are locked into place. Ask someone to hold it for you, if possible. Clearly mark and make sure that you can see: Any grab bars or handrails. First and last steps. Where the edge of each step is. Use tools that help you move around (mobility aids) if they are needed. These include: Canes. Walkers. Scooters. Crutches. Turn on the lights when you go into a dark area. Replace any light bulbs as soon as they burn out. Set up your furniture so you have a clear path. Avoid moving your furniture around. If any of your floors are uneven, fix them. If there are any pets around you, be aware of where they are. Review your medicines with your doctor. Some medicines can make you feel dizzy. This can increase your chance of falling. Ask your doctor what other things that you can do to help prevent falls. This information is not intended to replace advice given to you by your health care provider. Make sure you discuss any questions you have with your health care provider. Document Released: 04/14/2009 Document Revised: 11/24/2015 Document Reviewed: 07/23/2014 Elsevier Interactive Patient Education  2017 [employer].

## 2022-08-22 ENCOUNTER — Other Ambulatory Visit: Payer: Self-pay | Admitting: Internal Medicine

## 2022-09-11 ENCOUNTER — Other Ambulatory Visit (INDEPENDENT_AMBULATORY_CARE_PROVIDER_SITE_OTHER): Payer: Medicare Other

## 2022-09-11 DIAGNOSIS — E669 Obesity, unspecified: Secondary | ICD-10-CM

## 2022-09-11 DIAGNOSIS — Z0000 Encounter for general adult medical examination without abnormal findings: Secondary | ICD-10-CM

## 2022-09-11 DIAGNOSIS — E1169 Type 2 diabetes mellitus with other specified complication: Secondary | ICD-10-CM | POA: Diagnosis not present

## 2022-09-11 LAB — LIPID PANEL
Cholesterol: 112 mg/dL (ref 0–200)
HDL: 34.4 mg/dL — ABNORMAL LOW (ref >39.00)
LDL Cholesterol: 48 mg/dL (ref 0–99)
NonHDL: 77.33
Total CHOL/HDL Ratio: 3
Triglycerides: 145 mg/dL (ref 0.0–149.0)
VLDL: 29 mg/dL (ref 0.0–40.0)

## 2022-09-11 LAB — CBC WITH DIFFERENTIAL/PLATELET
Basophils Absolute: 0 10*3/uL (ref 0.0–0.1)
Basophils Relative: 0.8 % (ref 0.0–3.0)
Eosinophils Absolute: 0.2 10*3/uL (ref 0.0–0.7)
Eosinophils Relative: 3.5 % (ref 0.0–5.0)
HCT: 45.8 % (ref 39.0–52.0)
Hemoglobin: 15.7 g/dL (ref 13.0–17.0)
Lymphocytes Relative: 29.2 % (ref 12.0–46.0)
Lymphs Abs: 1.7 10*3/uL (ref 0.7–4.0)
MCHC: 34.3 g/dL (ref 30.0–36.0)
MCV: 97.2 fl (ref 78.0–100.0)
Monocytes Absolute: 0.6 10*3/uL (ref 0.1–1.0)
Monocytes Relative: 10.1 % (ref 3.0–12.0)
Neutro Abs: 3.3 10*3/uL (ref 1.4–7.7)
Neutrophils Relative %: 56.4 % (ref 43.0–77.0)
Platelets: 170 10*3/uL (ref 150.0–400.0)
RBC: 4.71 Mil/uL (ref 4.22–5.81)
RDW: 13.4 % (ref 11.5–15.5)
WBC: 5.9 10*3/uL (ref 4.0–10.5)

## 2022-09-11 LAB — COMPREHENSIVE METABOLIC PANEL
ALT: 21 U/L (ref 0–53)
AST: 18 U/L (ref 0–37)
Albumin: 3.9 g/dL (ref 3.5–5.2)
Alkaline Phosphatase: 55 U/L (ref 39–117)
BUN: 21 mg/dL (ref 6–23)
CO2: 29 mEq/L (ref 19–32)
Calcium: 9.9 mg/dL (ref 8.4–10.5)
Chloride: 102 mEq/L (ref 96–112)
Creatinine, Ser: 1.16 mg/dL (ref 0.40–1.50)
GFR: 63.2 mL/min (ref >60.00)
Glucose, Bld: 145 mg/dL — ABNORMAL HIGH (ref 70–99)
Potassium: 4 mEq/L (ref 3.5–5.1)
Sodium: 141 mEq/L (ref 135–145)
Total Bilirubin: 1.2 mg/dL (ref 0.2–1.2)
Total Protein: 6.1 g/dL (ref 6.0–8.3)

## 2022-09-11 LAB — URINALYSIS
Bilirubin Urine: NEGATIVE
Hgb urine dipstick: NEGATIVE
Ketones, ur: NEGATIVE
Leukocytes,Ua: NEGATIVE
Nitrite: NEGATIVE
Specific Gravity, Urine: 1.025 (ref 1.000–1.030)
Urine Glucose: >=1000 — AB
Urobilinogen, UA: 0.2 (ref 0.0–1.0)
pH: 6 (ref 5.0–8.0)

## 2022-09-11 LAB — HEMOGLOBIN A1C: Hgb A1c MFr Bld: 6.5 % (ref 4.6–6.5)

## 2022-09-11 LAB — PSA: PSA: 0.95 ng/mL (ref 0.10–4.00)

## 2022-09-11 LAB — TSH: TSH: 1.81 u[IU]/mL (ref 0.35–5.50)

## 2022-09-18 ENCOUNTER — Ambulatory Visit (INDEPENDENT_AMBULATORY_CARE_PROVIDER_SITE_OTHER): Payer: Medicare Other | Admitting: Internal Medicine

## 2022-09-18 ENCOUNTER — Ambulatory Visit (INDEPENDENT_AMBULATORY_CARE_PROVIDER_SITE_OTHER): Payer: Medicare Other

## 2022-09-18 ENCOUNTER — Encounter: Payer: Self-pay | Admitting: Internal Medicine

## 2022-09-18 VITALS — BP 132/78 | HR 86 | Temp 98.1°F | Ht 73.0 in | Wt 196.0 lb

## 2022-09-18 DIAGNOSIS — F419 Anxiety disorder, unspecified: Secondary | ICD-10-CM

## 2022-09-18 DIAGNOSIS — R059 Cough, unspecified: Secondary | ICD-10-CM

## 2022-09-18 DIAGNOSIS — M25552 Pain in left hip: Secondary | ICD-10-CM | POA: Diagnosis not present

## 2022-09-18 DIAGNOSIS — M545 Low back pain, unspecified: Secondary | ICD-10-CM

## 2022-09-18 DIAGNOSIS — I2510 Atherosclerotic heart disease of native coronary artery without angina pectoris: Secondary | ICD-10-CM | POA: Diagnosis not present

## 2022-09-18 DIAGNOSIS — E1169 Type 2 diabetes mellitus with other specified complication: Secondary | ICD-10-CM

## 2022-09-18 DIAGNOSIS — E669 Obesity, unspecified: Secondary | ICD-10-CM

## 2022-09-18 NOTE — Assessment & Plan Note (Signed)
Recurrent Blue-Emu cream was recommended to use 2-3 times a day

## 2022-09-18 NOTE — Assessment & Plan Note (Signed)
F/u w/Dr Marlou Porch Cont on Lipitor, ASA Stress test was normal 06/2022

## 2022-09-18 NOTE — Progress Notes (Signed)
Subjective:  Patient ID: [name] [name]., male    DOB: 07-03-1950  Age: 72 y.o. MRN: [id number]  CC: Annual Exam (physical)   HPI Anirvin Slowe [employer]. presents for DM, HTN, CAD  Outpatient Medications Prior to Visit  Medication Sig Dispense Refill   ACCU-CHEK GUIDE test strip USE TO TEST BLOOD SUGAR FIVE TIMES A DAY. DX: E11.69 450 strip 2   aspirin EC 81 MG tablet Take 1 tablet (81 mg total) by mouth daily. Swallow whole. 90 tablet 3   atorvastatin (LIPITOR) 80 MG tablet TAKE 1 TABLET BY MOUTH EVERY DAY 90 tablet 2   Blood Glucose Monitoring Suppl (ACCU-CHEK GUIDE ME) w/Device KIT 1 Units by Does not apply route 5 (five) times daily. 1 kit 1   buPROPion (WELLBUTRIN XL) 150 MG 24 hr tablet TAKE 1 TABLET BY MOUTH EVERY DAY 90 tablet 2   Cholecalciferol 1000 UNITS tablet Take 1 tablet (1,000 Units total) by mouth daily. 200 tablet 3   enalapril (VASOTEC) 5 MG tablet TAKE 2 TABLETS BY MOUTH EVERY DAY 180 tablet 2   glipiZIDE (GLUCOTROL XL) 5 MG 24 hr tablet TAKE 2 TABLETS BY MOUT IN AM OR ONE TWICE A DAY 180 tablet 2   ibuprofen (ADVIL) 600 MG tablet TAKE 1 TABLET (600 MG TOTAL) BY MOUTH EVERY 4 (FOUR) HOURS AS NEEDED. FOR PAIN 30 tablet 0   JARDIANCE 10 MG TABS tablet TAKE 1 TABLET BY MOUTH EVERY DAY 90 tablet 2   Lancets (ONETOUCH DELICA PLUS 123XX123) MISC USE TO HELP CHECK BLOOD SUGARS 5 TIMES A DAY 500 each 3   meclizine (ANTIVERT) 12.5 MG tablet TAKE 1 TABLET BY MOUTH 3 TIMES DAILY AS NEEDED. 90 tablet 1   metFORMIN (GLUCOPHAGE) 1000 MG tablet TAKE 1 TABLET BY MOUTH TWICE A DAY WITH MEAL 180 tablet 2   niacin (VITAMIN B3) 500 MG ER tablet TAKE 1 TABLET BY MOUTH EVERYDAY AT BEDTIME 90 tablet 3   OMEGA 3-6-9 FATTY ACIDS PO Take 1 tablet by mouth daily.     propranolol (INDERAL) 10 MG tablet TAKE 1 TO 2 TABLETS BY MOUTH EVERY DAY AS NEEDED 180 tablet 1   silodosin (RAPAFLO) 8 MG CAPS capsule TAKE 1 CAPSULE BY MOUTH EVERY DAY 90 capsule 2   tadalafil (CIALIS) 5 MG tablet TAKE  ONE TABLET DAILY FOR BPH 90 tablet 2   atovaquone-proguanil (MALARONE) 250-100 MG TABS tablet TAKE AS DIRECTED (Patient not taking: Reported on 06/19/2022) 60 tablet 4   diphenhydrAMINE (BENADRYL) 25 MG tablet Take 1-2 tablets (25-50 mg total) by mouth every 8 (eight) hours as needed for allergies. (Patient not taking: Reported on 06/19/2022) 60 tablet 5   indomethacin (INDOCIN SR) 75 MG CR capsule TAKE 1 CAPSULE BY MOUTH TWICE A DAY WITH MEAL (Patient not taking: Reported on 06/19/2022) 60 capsule 1   valACYclovir (VALTREX) 500 MG tablet TAKE 1 TABLET BY MOUTH THREE TIMES A DAY (Patient not taking: Reported on 06/19/2022) 21 tablet 2   No facility-administered medications prior to visit.    ROS: Review of Systems  Constitutional:  Negative for appetite change, fatigue and unexpected weight change.  HENT:  Negative for congestion, nosebleeds, sneezing, sore throat and trouble swallowing.   Eyes:  Negative for itching and visual disturbance.  Respiratory:  Negative for cough.   Cardiovascular:  Negative for chest pain, palpitations and leg swelling.  Gastrointestinal:  Negative for abdominal distention, blood in stool, diarrhea and nausea.  Genitourinary:  Negative for frequency and  hematuria.  Musculoskeletal:  Negative for back pain, gait problem, joint swelling and neck pain.  Skin:  Negative for rash.  Neurological:  Negative for dizziness, tremors, speech difficulty and weakness.  Psychiatric/Behavioral:  Negative for agitation, dysphoric mood and sleep disturbance. The patient is not nervous/anxious.     Objective:  BP 132/78 (BP Location: Left Arm, Patient Position: Sitting, Cuff Size: Large)   Pulse 86   Temp 98.1 F (36.7 C) (Oral)   Ht 6\' 1"  (1.854 m)   Wt 196 lb (88.9 kg)   SpO2 97%   BMI 25.86 kg/m   BP Readings from Last 3 Encounters:  09/18/22 132/78  06/19/22 138/62  06/11/22 (!) 142/40    Wt Readings from Last 3 Encounters:  09/18/22 196 lb (88.9 kg)   08/08/22 192 lb (87.1 kg)  06/19/22 194 lb 9.6 oz (88.3 kg)    Physical Exam Constitutional:      General: He is not in acute distress.    Appearance: He is well-developed. He is obese.     Comments: NAD  Eyes:     Conjunctiva/sclera: Conjunctivae normal.     Pupils: Pupils are equal, round, and reactive to light.  Neck:     Thyroid: No thyromegaly.     Vascular: No JVD.  Cardiovascular:     Rate and Rhythm: Normal rate and regular rhythm.     Heart sounds: Normal heart sounds. No murmur heard.    No friction rub. No gallop.  Pulmonary:     Effort: Pulmonary effort is normal. No respiratory distress.     Breath sounds: Normal breath sounds. No wheezing or rales.  Chest:     Chest wall: No tenderness.  Abdominal:     General: Bowel sounds are normal. There is no distension.     Palpations: Abdomen is soft. There is no mass.     Tenderness: There is no abdominal tenderness. There is no guarding or rebound.  Musculoskeletal:        General: No tenderness. Normal range of motion.     Cervical back: Normal range of motion.  Lymphadenopathy:     Cervical: No cervical adenopathy.  Skin:    General: Skin is warm and dry.     Findings: No rash.  Neurological:     Mental Status: He is alert and oriented to person, place, and time.     Cranial Nerves: No cranial nerve deficit.     Motor: No abnormal muscle tone.     Coordination: Coordination normal.     Gait: Gait normal.     Deep Tendon Reflexes: Reflexes are normal and symmetric.  Psychiatric:        Behavior: Behavior normal.        Thought Content: Thought content normal.        Judgment: Judgment normal.     Lab Results  Component Value Date   WBC 5.9 09/11/2022   HGB 15.7 09/11/2022   HCT 45.8 09/11/2022   PLT 170.0 09/11/2022   GLUCOSE 145 (H) 09/11/2022   CHOL 112 09/11/2022   TRIG 145.0 09/11/2022   HDL 34.40 (L) 09/11/2022   LDLDIRECT 71.0 03/05/2019   LDLCALC 48 09/11/2022   ALT 21 09/11/2022   AST 18  09/11/2022   NA 141 09/11/2022   K 4.0 09/11/2022   CL 102 09/11/2022   CREATININE 1.16 09/11/2022   BUN 21 09/11/2022   CO2 29 09/11/2022   TSH 1.81 09/11/2022   PSA 0.95 09/11/2022  INR 1.3 04/08/2008   HGBA1C 6.5 09/11/2022   MICROALBUR 9.5 (H) 08/31/2021    US RENAL  Result Date: 12/09/2020 CLINICAL DATA:  Decreased GFR. EXAM: RENAL / URINARY TRACT ULTRASOUND COMPLETE COMPARISON:  None. FINDINGS: Right Kidney: Renal measurements: 12.0 x 5.3 x 5.1 cm = volume: 168 mL. Echogenicity within normal limits. No mass or hydronephrosis visualized. Left Kidney: Renal measurements: 12.3 x 5.5 x 4.8 cm = volume: 169 mL. Echogenicity within normal limits. No mass or hydronephrosis visualized. Bladder: Appears normal for degree of bladder distention. Bilateral ureteral jets are noted. Other: Mild prostatic enlargement is noted. IMPRESSION: Kidneys are unremarkable.  Mild prostatic enlargement. Electronically Signed   By: Marijo Conception M.D.   On: 12/09/2020 15:00    Assessment & Plan:   Problem List Items Addressed This Visit       Cardiovascular and Mediastinum   CAD (coronary artery disease) - Primary    F/u w/Dr Marlou Porch Cont on Lipitor, ASA Stress test was normal 06/2022        Endocrine   Diabetes mellitus type 2 in obese (HCC)    Cont on Glipizide, Metformin, Jardiance         Other   Lumbar back pain    Recurrent Blue-Emu cream was recommended to use 2-3 times a day      Left hip pain    Get a mattress memory foam pad Blue-Emu cream was recommended to use 2-3 times a day       Anxiety    Try Propranolol 10-20 mg prn         No orders of the defined types were placed in this encounter.     Follow-up: No follow-ups on file.  Walker Kehr, MD

## 2022-09-18 NOTE — Assessment & Plan Note (Signed)
Try Propranolol 10-20 mg prn

## 2022-09-18 NOTE — Assessment & Plan Note (Addendum)
Get a mattress memory foam pad Blue-Emu cream was recommended to use 2-3 times a day

## 2022-09-18 NOTE — Assessment & Plan Note (Signed)
Cont on Glipizide, Metformin, Jardiance  

## 2022-11-19 ENCOUNTER — Other Ambulatory Visit: Payer: Self-pay | Admitting: Internal Medicine

## 2022-12-12 ENCOUNTER — Other Ambulatory Visit (INDEPENDENT_AMBULATORY_CARE_PROVIDER_SITE_OTHER): Payer: Medicare Other

## 2022-12-12 DIAGNOSIS — E119 Type 2 diabetes mellitus without complications: Secondary | ICD-10-CM

## 2022-12-12 LAB — BASIC METABOLIC PANEL
BUN: 21 mg/dL (ref 6–23)
CO2: 28 mEq/L (ref 19–32)
Calcium: 9.5 mg/dL (ref 8.4–10.5)
Chloride: 103 mEq/L (ref 96–112)
Creatinine, Ser: 1.15 mg/dL (ref 0.40–1.50)
GFR: 63.75 mL/min (ref >60.00)
Glucose, Bld: 142 mg/dL — ABNORMAL HIGH (ref 70–99)
Potassium: 3.9 mEq/L (ref 3.5–5.1)
Sodium: 141 mEq/L (ref 135–145)

## 2022-12-12 LAB — HEMOGLOBIN A1C: Hgb A1c MFr Bld: 6.5 % (ref 4.6–6.5)

## 2022-12-19 ENCOUNTER — Encounter: Payer: Self-pay | Admitting: Internal Medicine

## 2022-12-19 ENCOUNTER — Ambulatory Visit (INDEPENDENT_AMBULATORY_CARE_PROVIDER_SITE_OTHER): Payer: Medicare Other | Admitting: Internal Medicine

## 2022-12-19 VITALS — BP 118/72 | HR 55 | Temp 98.4°F | Ht 73.0 in | Wt 197.0 lb

## 2022-12-19 DIAGNOSIS — F419 Anxiety disorder, unspecified: Secondary | ICD-10-CM

## 2022-12-19 DIAGNOSIS — I152 Hypertension secondary to endocrine disorders: Secondary | ICD-10-CM | POA: Diagnosis not present

## 2022-12-19 DIAGNOSIS — E669 Obesity, unspecified: Secondary | ICD-10-CM | POA: Diagnosis not present

## 2022-12-19 DIAGNOSIS — I2510 Atherosclerotic heart disease of native coronary artery without angina pectoris: Secondary | ICD-10-CM | POA: Diagnosis not present

## 2022-12-19 DIAGNOSIS — E1159 Type 2 diabetes mellitus with other circulatory complications: Secondary | ICD-10-CM

## 2022-12-19 DIAGNOSIS — M545 Low back pain, unspecified: Secondary | ICD-10-CM | POA: Diagnosis not present

## 2022-12-19 DIAGNOSIS — Z7984 Long term (current) use of oral hypoglycemic drugs: Secondary | ICD-10-CM | POA: Diagnosis not present

## 2022-12-19 DIAGNOSIS — E119 Type 2 diabetes mellitus without complications: Secondary | ICD-10-CM

## 2022-12-19 DIAGNOSIS — G250 Essential tremor: Secondary | ICD-10-CM

## 2022-12-19 DIAGNOSIS — E291 Testicular hypofunction: Secondary | ICD-10-CM | POA: Diagnosis not present

## 2022-12-19 DIAGNOSIS — R944 Abnormal results of kidney function studies: Secondary | ICD-10-CM | POA: Diagnosis not present

## 2022-12-19 DIAGNOSIS — E1169 Type 2 diabetes mellitus with other specified complication: Secondary | ICD-10-CM | POA: Diagnosis not present

## 2022-12-19 NOTE — Assessment & Plan Note (Signed)
Dr Katrinka Blazing Recurrent Blue-Emu cream was recommended to use 2-3 times a day

## 2022-12-19 NOTE — Assessment & Plan Note (Signed)
Better on prn Propranolol 

## 2022-12-19 NOTE — Assessment & Plan Note (Signed)
No ischemia F/u w/Dr Anne Fu Cont on Lipitor, ASA

## 2022-12-19 NOTE — Assessment & Plan Note (Signed)
Cont on Glipizide, Metformin, Jardiance  Replacing diagnoses that were inactivated after the 10/01/22 regulatory import

## 2022-12-19 NOTE — Assessment & Plan Note (Signed)
Stage anxiety Lorazepam prn Will increase Lorazepam to 1 mg prn 2 h prior Try Propranolol 10-20 mg prn

## 2022-12-19 NOTE — Progress Notes (Signed)
Subjective:  Patient ID: [name] [name]., male    DOB: 1951-02-18  Age: 72 y.o. MRN: [id number]  CC: Follow-up (3 MNTH F/U)   HPI [name] Morrow [employer]. presents for DM, HTN, CAD  Outpatient Medications Prior to Visit  Medication Sig Dispense Refill   aspirin EC 81 MG tablet Take 1 tablet (81 mg total) by mouth daily. Swallow whole. 90 tablet 3   atorvastatin (LIPITOR) 80 MG tablet TAKE 1 TABLET BY MOUTH EVERY DAY 90 tablet 2   Blood Glucose Monitoring Suppl (ACCU-CHEK GUIDE ME) w/Device KIT 1 Units by Does not apply route 5 (five) times daily. 1 kit 1   buPROPion (WELLBUTRIN XL) 150 MG 24 hr tablet TAKE 1 TABLET BY MOUTH EVERY DAY 90 tablet 2   Cholecalciferol 1000 UNITS tablet Take 1 tablet (1,000 Units total) by mouth daily. 200 tablet 3   enalapril (VASOTEC) 5 MG tablet TAKE 2 TABLETS BY MOUTH EVERY DAY 180 tablet 2   glipiZIDE (GLUCOTROL XL) 5 MG 24 hr tablet TAKE 2 TABLETS BY MOUT IN AM OR ONE TWICE A DAY 180 tablet 2   glucose blood (ACCU-CHEK GUIDE) test strip USE TO TEST BLOOD SUGAR FIVE TIMES A DAY 450 strip 1   ibuprofen (ADVIL) 600 MG tablet TAKE 1 TABLET (600 MG TOTAL) BY MOUTH EVERY 4 (FOUR) HOURS AS NEEDED. FOR PAIN 30 tablet 0   JARDIANCE 10 MG TABS tablet TAKE 1 TABLET BY MOUTH EVERY DAY 90 tablet 2   Lancets (ONETOUCH DELICA PLUS LANCET33G) MISC USE TO HELP CHECK BLOOD SUGARS 5 TIMES A DAY 500 each 3   meclizine (ANTIVERT) 12.5 MG tablet TAKE 1 TABLET BY MOUTH 3 TIMES DAILY AS NEEDED. 90 tablet 1   metFORMIN (GLUCOPHAGE) 1000 MG tablet TAKE 1 TABLET BY MOUTH TWICE A DAY WITH MEAL 180 tablet 2   niacin (VITAMIN B3) 500 MG ER tablet TAKE 1 TABLET BY MOUTH EVERYDAY AT BEDTIME 90 tablet 3   OMEGA 3-6-9 FATTY ACIDS PO Take 1 tablet by mouth daily.     propranolol (INDERAL) 10 MG tablet TAKE 1 TO 2 TABLETS BY MOUTH EVERY DAY AS NEEDED 180 tablet 1   silodosin (RAPAFLO) 8 MG CAPS capsule TAKE 1 CAPSULE BY MOUTH EVERY DAY 90 capsule 2   tadalafil (CIALIS) 5 MG tablet TAKE ONE  TABLET DAILY FOR BPH 90 tablet 2   valACYclovir (VALTREX) 500 MG tablet TAKE 1 TABLET BY MOUTH THREE TIMES A DAY 21 tablet 2   atovaquone-proguanil (MALARONE) 250-100 MG TABS tablet TAKE AS DIRECTED (Patient not taking: Reported on 06/19/2022) 60 tablet 4   diphenhydrAMINE (BENADRYL) 25 MG tablet Take 1-2 tablets (25-50 mg total) by mouth every 8 (eight) hours as needed for allergies. (Patient not taking: Reported on 06/19/2022) 60 tablet 5   indomethacin (INDOCIN SR) 75 MG CR capsule TAKE 1 CAPSULE BY MOUTH TWICE A DAY WITH MEAL (Patient not taking: Reported on 06/19/2022) 60 capsule 1   No facility-administered medications prior to visit.    ROS: Review of Systems  Constitutional:  Negative for appetite change, fatigue and unexpected weight change.  HENT:  Negative for congestion, nosebleeds, sneezing, sore throat and trouble swallowing.   Eyes:  Negative for itching and visual disturbance.  Respiratory:  Negative for cough.   Cardiovascular:  Negative for chest pain, palpitations and leg swelling.  Gastrointestinal:  Negative for abdominal distention, blood in stool, diarrhea and nausea.  Genitourinary:  Negative for frequency and hematuria.  Musculoskeletal:  Negative  for back pain, gait problem, joint swelling and neck pain.  Skin:  Negative for rash.  Neurological:  Negative for dizziness, tremors, speech difficulty and weakness.  Psychiatric/Behavioral:  Negative for agitation, dysphoric mood and sleep disturbance. The patient is not nervous/anxious.     Objective:  BP 118/72 (BP Location: Right Arm, Patient Position: Sitting, Cuff Size: Large)   Pulse (!) 55   Temp 98.4 F (36.9 C) (Oral)   Ht 6\' 1"  (1.854 m)   Wt 197 lb (89.4 kg)   SpO2 97%   BMI 25.99 kg/m   BP Readings from Last 3 Encounters:  12/19/22 118/72  09/18/22 132/78  06/19/22 138/62    Wt Readings from Last 3 Encounters:  12/19/22 197 lb (89.4 kg)  09/18/22 196 lb (88.9 kg)  08/08/22 192 lb (87.1 kg)     Physical Exam Constitutional:      General: He is not in acute distress.    Appearance: He is well-developed. He is obese.     Comments: NAD  Eyes:     Conjunctiva/sclera: Conjunctivae normal.     Pupils: Pupils are equal, round, and reactive to light.  Neck:     Thyroid: No thyromegaly.     Vascular: No JVD.  Cardiovascular:     Rate and Rhythm: Normal rate and regular rhythm.     Heart sounds: Normal heart sounds. No murmur heard.    No friction rub. No gallop.  Pulmonary:     Effort: Pulmonary effort is normal. No respiratory distress.     Breath sounds: Normal breath sounds. No wheezing or rales.  Chest:     Chest wall: No tenderness.  Abdominal:     General: Bowel sounds are normal. There is no distension.     Palpations: Abdomen is soft. There is no mass.     Tenderness: There is no abdominal tenderness. There is no guarding or rebound.  Musculoskeletal:        General: No tenderness. Normal range of motion.     Cervical back: Normal range of motion.  Lymphadenopathy:     Cervical: No cervical adenopathy.  Skin:    General: Skin is warm and dry.     Findings: No rash.  Neurological:     Mental Status: He is alert and oriented to person, place, and time.     Cranial Nerves: No cranial nerve deficit.     Motor: No abnormal muscle tone.     Coordination: Coordination normal.     Gait: Gait normal.     Deep Tendon Reflexes: Reflexes are normal and symmetric.  Psychiatric:        Behavior: Behavior normal.        Thought Content: Thought content normal.        Judgment: Judgment normal.     Lab Results  Component Value Date   WBC 5.9 09/11/2022   HGB 15.7 09/11/2022   HCT 45.8 09/11/2022   PLT 170.0 09/11/2022   GLUCOSE 142 (H) 12/12/2022   CHOL 112 09/11/2022   TRIG 145.0 09/11/2022   HDL 34.40 (L) 09/11/2022   LDLDIRECT 71.0 03/05/2019   LDLCALC 48 09/11/2022   ALT 21 09/11/2022   AST 18 09/11/2022   NA 141 12/12/2022   K 3.9 12/12/2022   CL 103  12/12/2022   CREATININE 1.15 12/12/2022   BUN 21 12/12/2022   CO2 28 12/12/2022   TSH 1.81 09/11/2022   PSA 0.95 09/11/2022   INR 1.3 04/08/2008   HGBA1C  6.5 12/12/2022   MICROALBUR 9.5 (H) 08/31/2021    US RENAL  Result Date: 12/09/2020 CLINICAL DATA:  Decreased GFR. EXAM: RENAL / URINARY TRACT ULTRASOUND COMPLETE COMPARISON:  None. FINDINGS: Right Kidney: Renal measurements: 12.0 x 5.3 x 5.1 cm = volume: 168 mL. Echogenicity within normal limits. No mass or hydronephrosis visualized. Left Kidney: Renal measurements: 12.3 x 5.5 x 4.8 cm = volume: 169 mL. Echogenicity within normal limits. No mass or hydronephrosis visualized. Bladder: Appears normal for degree of bladder distention. Bilateral ureteral jets are noted. Other: Mild prostatic enlargement is noted. IMPRESSION: Kidneys are unremarkable.  Mild prostatic enlargement. Electronically Signed   By: Lupita Raider M.D.   On: 12/09/2020 15:00    Assessment & Plan:   Problem List Items Addressed This Visit     Diabetes mellitus type 2 in obese - Primary    Cont on Glipizide, Metformin, Jardiance  Replacing diagnoses that were inactivated after the 10/01/22 regulatory import      Relevant Orders   Comprehensive metabolic panel   Hemoglobin A1c   Hypogonadism in male    Chronic   Potential benefits of a long term sex steroid  use as well as potential risks  and complications were explained to the patient and were aknowledged.      Relevant Orders   Comprehensive metabolic panel   Hemoglobin A1c   CAD (coronary artery disease)    No ischemia F/u w/Dr Anne Fu Cont on Lipitor, ASA      Hypertension associated with diabetes (HCC)    Cont on Enalapril      Lumbar back pain    Dr Katrinka Blazing Recurrent Blue-Emu cream was recommended to use 2-3 times a day      Essential tremor    Better on prn Propranolol      Decreased GFR     Hydrate well No NSAIDs      Anxiety    Stage anxiety Lorazepam prn Will increase  Lorazepam to 1 mg prn 2 h prior Try Propranolol 10-20 mg prn         No orders of the defined types were placed in this encounter.     Follow-up: Return in about 3 months (around 03/21/2023) for a follow-up visit.  Sonda Primes, MD

## 2022-12-19 NOTE — Assessment & Plan Note (Signed)
  Hydrate well No NSAIDs

## 2022-12-19 NOTE — Assessment & Plan Note (Signed)
Cont on Enalapril 

## 2022-12-19 NOTE — Assessment & Plan Note (Signed)
Chronic   Potential benefits of a long term sex steroid  use as well as potential risks  and complications were explained to the patient and were aknowledged.

## 2023-02-11 ENCOUNTER — Other Ambulatory Visit: Payer: Self-pay | Admitting: Internal Medicine

## 2023-02-12 ENCOUNTER — Other Ambulatory Visit: Payer: Self-pay | Admitting: Internal Medicine

## 2023-02-14 ENCOUNTER — Encounter (INDEPENDENT_AMBULATORY_CARE_PROVIDER_SITE_OTHER): Payer: Self-pay

## 2023-03-14 ENCOUNTER — Other Ambulatory Visit (INDEPENDENT_AMBULATORY_CARE_PROVIDER_SITE_OTHER): Payer: Medicare Other

## 2023-03-14 DIAGNOSIS — E1169 Type 2 diabetes mellitus with other specified complication: Secondary | ICD-10-CM

## 2023-03-14 DIAGNOSIS — E669 Obesity, unspecified: Secondary | ICD-10-CM | POA: Diagnosis not present

## 2023-03-14 DIAGNOSIS — E291 Testicular hypofunction: Secondary | ICD-10-CM

## 2023-03-14 LAB — COMPREHENSIVE METABOLIC PANEL
ALT: 15 U/L (ref 0–53)
AST: 16 U/L (ref 0–37)
Albumin: 4.1 g/dL (ref 3.5–5.2)
Alkaline Phosphatase: 51 U/L (ref 39–117)
BUN: 19 mg/dL (ref 6–23)
CO2: 28 meq/L (ref 19–32)
Calcium: 9.5 mg/dL (ref 8.4–10.5)
Chloride: 103 meq/L (ref 96–112)
Creatinine, Ser: 1.22 mg/dL (ref 0.40–1.50)
GFR: 59.28 mL/min — ABNORMAL LOW (ref >60.00)
Glucose, Bld: 120 mg/dL — ABNORMAL HIGH (ref 70–99)
Potassium: 3.9 meq/L (ref 3.5–5.1)
Sodium: 142 meq/L (ref 135–145)
Total Bilirubin: 1.1 mg/dL (ref 0.2–1.2)
Total Protein: 6.5 g/dL (ref 6.0–8.3)

## 2023-03-14 LAB — HEMOGLOBIN A1C: Hgb A1c MFr Bld: 6.6 % — ABNORMAL HIGH (ref 4.6–6.5)

## 2023-03-21 ENCOUNTER — Encounter: Payer: Self-pay | Admitting: Internal Medicine

## 2023-03-21 ENCOUNTER — Ambulatory Visit (INDEPENDENT_AMBULATORY_CARE_PROVIDER_SITE_OTHER): Payer: Medicare Other | Admitting: Internal Medicine

## 2023-03-21 VITALS — BP 118/60 | HR 63 | Temp 98.2°F | Ht 73.0 in | Wt 197.0 lb

## 2023-03-21 DIAGNOSIS — Z23 Encounter for immunization: Secondary | ICD-10-CM

## 2023-03-21 DIAGNOSIS — E782 Mixed hyperlipidemia: Secondary | ICD-10-CM

## 2023-03-21 DIAGNOSIS — E1169 Type 2 diabetes mellitus with other specified complication: Secondary | ICD-10-CM | POA: Diagnosis not present

## 2023-03-21 DIAGNOSIS — Z7984 Long term (current) use of oral hypoglycemic drugs: Secondary | ICD-10-CM | POA: Diagnosis not present

## 2023-03-21 DIAGNOSIS — R944 Abnormal results of kidney function studies: Secondary | ICD-10-CM | POA: Diagnosis not present

## 2023-03-21 DIAGNOSIS — I2510 Atherosclerotic heart disease of native coronary artery without angina pectoris: Secondary | ICD-10-CM | POA: Diagnosis not present

## 2023-03-21 DIAGNOSIS — E669 Obesity, unspecified: Secondary | ICD-10-CM

## 2023-03-21 NOTE — Assessment & Plan Note (Signed)
Hydrate well No NSAIDs

## 2023-03-21 NOTE — Assessment & Plan Note (Signed)
Cont on Glipizide, Metformin, Jardiance

## 2023-03-21 NOTE — Progress Notes (Signed)
Subjective:  Patient ID: [name] Morrow Huckeba Montez Hageman., male    DOB: 1951/02/04  Age: 72 y.o. MRN: [id number]  CC: Follow-up (3 MNTH F/U)   HPI [name] Morrow [employer]. presents for HTN, DM, CAD  Outpatient Medications Prior to Visit  Medication Sig Dispense Refill   aspirin EC 81 MG tablet Take 1 tablet (81 mg total) by mouth daily. Swallow whole. 90 tablet 3   atorvastatin (LIPITOR) 80 MG tablet TAKE 1 TABLET BY MOUTH EVERY DAY 90 tablet 2   Blood Glucose Monitoring Suppl (ACCU-CHEK GUIDE ME) w/Device KIT 1 Units by Does not apply route 5 (five) times daily. 1 kit 1   buPROPion (WELLBUTRIN XL) 150 MG 24 hr tablet TAKE 1 TABLET BY MOUTH EVERY DAY 90 tablet 1   Cholecalciferol 1000 UNITS tablet Take 1 tablet (1,000 Units total) by mouth daily. 200 tablet 3   enalapril (VASOTEC) 5 MG tablet TAKE 2 TABLETS BY MOUTH EVERY DAY 180 tablet 1   glipiZIDE (GLUCOTROL XL) 5 MG 24 hr tablet TAKE 2 TABLETS BY MOUTH IN AM OR ONE TWICE A DAY 180 tablet 2   glucose blood (ACCU-CHEK GUIDE) test strip USE TO TEST BLOOD SUGAR FIVE TIMES A DAY 450 strip 1   ibuprofen (ADVIL) 600 MG tablet TAKE 1 TABLET (600 MG TOTAL) BY MOUTH EVERY 4 (FOUR) HOURS AS NEEDED. FOR PAIN 30 tablet 0   JARDIANCE 10 MG TABS tablet TAKE 1 TABLET BY MOUTH EVERY DAY 90 tablet 2   Lancets (ONETOUCH DELICA PLUS LANCET33G) MISC USE TO HELP CHECK BLOOD SUGARS 5 TIMES A DAY 500 each 3   meclizine (ANTIVERT) 12.5 MG tablet TAKE 1 TABLET BY MOUTH 3 TIMES DAILY AS NEEDED. 90 tablet 1   metFORMIN (GLUCOPHAGE) 1000 MG tablet TAKE 1 TABLET BY MOUTH TWICE A DAY WITH FOOD 180 tablet 2   niacin (VITAMIN B3) 500 MG ER tablet TAKE 1 TABLET BY MOUTH EVERYDAY AT BEDTIME 90 tablet 3   OMEGA 3-6-9 FATTY ACIDS PO Take 1 tablet by mouth daily.     propranolol (INDERAL) 10 MG tablet TAKE 1 TO 2 TABLETS BY MOUTH EVERY DAY AS NEEDED 180 tablet 1   silodosin (RAPAFLO) 8 MG CAPS capsule TAKE 1 CAPSULE BY MOUTH EVERY DAY 90 capsule 2   tadalafil (CIALIS) 5 MG tablet TAKE  ONE TABLET DAILY FOR BPH 90 tablet 2   valACYclovir (VALTREX) 500 MG tablet TAKE 1 TABLET BY MOUTH THREE TIMES A DAY 21 tablet 2   atovaquone-proguanil (MALARONE) 250-100 MG TABS tablet TAKE AS DIRECTED (Patient not taking: Reported on 06/19/2022) 60 tablet 4   diphenhydrAMINE (BENADRYL) 25 MG tablet Take 1-2 tablets (25-50 mg total) by mouth every 8 (eight) hours as needed for allergies. (Patient not taking: Reported on 06/19/2022) 60 tablet 5   indomethacin (INDOCIN SR) 75 MG CR capsule TAKE 1 CAPSULE BY MOUTH TWICE A DAY WITH MEAL (Patient not taking: Reported on 06/19/2022) 60 capsule 1   No facility-administered medications prior to visit.    ROS: Review of Systems  Constitutional:  Negative for appetite change, fatigue and unexpected weight change.  HENT:  Negative for congestion, nosebleeds, sneezing, sore throat and trouble swallowing.   Eyes:  Negative for itching and visual disturbance.  Respiratory:  Negative for cough.   Cardiovascular:  Negative for chest pain, palpitations and leg swelling.  Gastrointestinal:  Negative for abdominal distention, blood in stool, diarrhea and nausea.  Genitourinary:  Negative for frequency and hematuria.  Musculoskeletal:  Positive  for arthralgias. Negative for back pain, gait problem, joint swelling and neck pain.  Skin:  Negative for rash.  Neurological:  Negative for dizziness, tremors, speech difficulty and weakness.  Psychiatric/Behavioral:  Negative for agitation, dysphoric mood and sleep disturbance. The patient is not nervous/anxious.     Objective:  BP 118/60 (BP Location: Right Arm, Patient Position: Sitting, Cuff Size: Large)   Pulse 63   Temp 98.2 F (36.8 C) (Oral)   Ht 6\' 1"  (1.854 m)   Wt 197 lb (89.4 kg)   SpO2 93%   BMI 25.99 kg/m   BP Readings from Last 3 Encounters:  03/21/23 118/60  12/19/22 118/72  09/18/22 132/78    Wt Readings from Last 3 Encounters:  03/21/23 197 lb (89.4 kg)  12/19/22 197 lb (89.4 kg)   09/18/22 196 lb (88.9 kg)    Physical Exam Constitutional:      General: He is not in acute distress.    Appearance: He is well-developed. He is obese.     Comments: NAD  Eyes:     Conjunctiva/sclera: Conjunctivae normal.     Pupils: Pupils are equal, round, and reactive to light.  Neck:     Thyroid: No thyromegaly.     Vascular: No JVD.  Cardiovascular:     Rate and Rhythm: Normal rate and regular rhythm.     Heart sounds: Normal heart sounds. No murmur heard.    No friction rub. No gallop.  Pulmonary:     Effort: Pulmonary effort is normal. No respiratory distress.     Breath sounds: Normal breath sounds. No wheezing or rales.  Chest:     Chest wall: No tenderness.  Abdominal:     General: Bowel sounds are normal. There is no distension.     Palpations: Abdomen is soft. There is no mass.     Tenderness: There is no abdominal tenderness. There is no guarding or rebound.  Musculoskeletal:        General: No tenderness. Normal range of motion.     Cervical back: Normal range of motion.  Lymphadenopathy:     Cervical: No cervical adenopathy.  Skin:    General: Skin is warm and dry.     Findings: No rash.  Neurological:     Mental Status: He is alert and oriented to person, place, and time.     Cranial Nerves: No cranial nerve deficit.     Motor: No abnormal muscle tone.     Coordination: Coordination normal.     Gait: Gait normal.     Deep Tendon Reflexes: Reflexes are normal and symmetric.  Psychiatric:        Behavior: Behavior normal.        Thought Content: Thought content normal.        Judgment: Judgment normal.     Lab Results  Component Value Date   WBC 5.9 09/11/2022   HGB 15.7 09/11/2022   HCT 45.8 09/11/2022   PLT 170.0 09/11/2022   GLUCOSE 120 (H) 03/14/2023   CHOL 112 09/11/2022   TRIG 145.0 09/11/2022   HDL 34.40 (L) 09/11/2022   LDLDIRECT 71.0 03/05/2019   LDLCALC 48 09/11/2022   ALT 15 03/14/2023   AST 16 03/14/2023   NA 142 03/14/2023    K 3.9 03/14/2023   CL 103 03/14/2023   CREATININE 1.22 03/14/2023   BUN 19 03/14/2023   CO2 28 03/14/2023   TSH 1.81 09/11/2022   PSA 0.95 09/11/2022   INR 1.3 04/08/2008  HGBA1C 6.6 (H) 03/14/2023   MICROALBUR 9.5 (H) 08/31/2021    US RENAL  Result Date: 12/09/2020 CLINICAL DATA:  Decreased GFR. EXAM: RENAL / URINARY TRACT ULTRASOUND COMPLETE COMPARISON:  None. FINDINGS: Right Kidney: Renal measurements: 12.0 x 5.3 x 5.1 cm = volume: 168 mL. Echogenicity within normal limits. No mass or hydronephrosis visualized. Left Kidney: Renal measurements: 12.3 x 5.5 x 4.8 cm = volume: 169 mL. Echogenicity within normal limits. No mass or hydronephrosis visualized. Bladder: Appears normal for degree of bladder distention. Bilateral ureteral jets are noted. Other: Mild prostatic enlargement is noted. IMPRESSION: Kidneys are unremarkable.  Mild prostatic enlargement. Electronically Signed   By: Lupita Raider M.D.   On: 12/09/2020 15:00    Assessment & Plan:   Problem List Items Addressed This Visit     Diabetes mellitus type 2 in obese - Primary    Cont on Glipizide, Metformin, Jardiance        Relevant Orders   Comprehensive metabolic panel   Hemoglobin A1c   Mixed hyperlipidemia    On Lipitor      CAD (coronary artery disease)    No ischemia F/u w/Dr Anne Fu Cont on Lipitor, ASA      Decreased GFR     Hydrate well No NSAIDs      Other Visit Diagnoses     Need for influenza vaccination       Relevant Orders   Flu Vaccine Trivalent High Dose (Fluad)         No orders of the defined types were placed in this encounter.     Follow-up: Return in about 3 months (around 06/20/2023) for a follow-up visit.  Sonda Primes, MD

## 2023-03-21 NOTE — Assessment & Plan Note (Signed)
No ischemia F/u w/Dr Anne Fu Cont on Lipitor, ASA

## 2023-03-21 NOTE — Assessment & Plan Note (Signed)
On Lipitor

## 2023-03-27 ENCOUNTER — Encounter: Payer: Self-pay | Admitting: Internal Medicine

## 2023-03-27 DIAGNOSIS — E1169 Type 2 diabetes mellitus with other specified complication: Secondary | ICD-10-CM | POA: Diagnosis not present

## 2023-03-27 DIAGNOSIS — Z23 Encounter for immunization: Secondary | ICD-10-CM

## 2023-03-27 DIAGNOSIS — E782 Mixed hyperlipidemia: Secondary | ICD-10-CM | POA: Diagnosis not present

## 2023-04-04 DIAGNOSIS — H40013 Open angle with borderline findings, low risk, bilateral: Secondary | ICD-10-CM | POA: Diagnosis not present

## 2023-04-04 DIAGNOSIS — H40053 Ocular hypertension, bilateral: Secondary | ICD-10-CM | POA: Diagnosis not present

## 2023-04-04 DIAGNOSIS — H25813 Combined forms of age-related cataract, bilateral: Secondary | ICD-10-CM | POA: Diagnosis not present

## 2023-04-04 DIAGNOSIS — H524 Presbyopia: Secondary | ICD-10-CM | POA: Diagnosis not present

## 2023-04-04 DIAGNOSIS — E119 Type 2 diabetes mellitus without complications: Secondary | ICD-10-CM | POA: Diagnosis not present

## 2023-04-04 DIAGNOSIS — H52223 Regular astigmatism, bilateral: Secondary | ICD-10-CM | POA: Diagnosis not present

## 2023-04-04 DIAGNOSIS — H5203 Hypermetropia, bilateral: Secondary | ICD-10-CM | POA: Diagnosis not present

## 2023-04-04 LAB — HM DIABETES EYE EXAM

## 2023-05-13 ENCOUNTER — Other Ambulatory Visit: Payer: Self-pay | Admitting: Internal Medicine

## 2023-05-17 ENCOUNTER — Ambulatory Visit (HOSPITAL_COMMUNITY): Payer: Medicare Other | Attending: Internal Medicine

## 2023-05-17 DIAGNOSIS — R011 Cardiac murmur, unspecified: Secondary | ICD-10-CM | POA: Diagnosis not present

## 2023-05-17 DIAGNOSIS — I77810 Thoracic aortic ectasia: Secondary | ICD-10-CM | POA: Diagnosis not present

## 2023-05-17 LAB — ECHOCARDIOGRAM COMPLETE
Area-P 1/2: 2.8 cm2
P 1/2 time: 483 ms
S' Lateral: 2 cm

## 2023-05-20 ENCOUNTER — Other Ambulatory Visit: Payer: Self-pay | Admitting: *Deleted

## 2023-05-20 DIAGNOSIS — I77810 Thoracic aortic ectasia: Secondary | ICD-10-CM

## 2023-05-20 DIAGNOSIS — I351 Nonrheumatic aortic (valve) insufficiency: Secondary | ICD-10-CM

## 2023-05-24 ENCOUNTER — Other Ambulatory Visit: Payer: Self-pay | Admitting: Internal Medicine

## 2023-06-02 ENCOUNTER — Other Ambulatory Visit: Payer: Self-pay | Admitting: Internal Medicine

## 2023-06-13 ENCOUNTER — Other Ambulatory Visit (INDEPENDENT_AMBULATORY_CARE_PROVIDER_SITE_OTHER): Payer: Medicare Other

## 2023-06-13 DIAGNOSIS — E669 Obesity, unspecified: Secondary | ICD-10-CM

## 2023-06-13 DIAGNOSIS — E1169 Type 2 diabetes mellitus with other specified complication: Secondary | ICD-10-CM

## 2023-06-13 LAB — COMPREHENSIVE METABOLIC PANEL
ALT: 20 U/L (ref 0–53)
AST: 18 U/L (ref 0–37)
Albumin: 4.1 g/dL (ref 3.5–5.2)
Alkaline Phosphatase: 46 U/L (ref 39–117)
BUN: 20 mg/dL (ref 6–23)
CO2: 26 meq/L (ref 19–32)
Calcium: 8.9 mg/dL (ref 8.4–10.5)
Chloride: 104 meq/L (ref 96–112)
Creatinine, Ser: 1.19 mg/dL (ref 0.40–1.50)
GFR: 60.97 mL/min (ref >60.00)
Glucose, Bld: 142 mg/dL — ABNORMAL HIGH (ref 70–99)
Potassium: 3.5 meq/L (ref 3.5–5.1)
Sodium: 141 meq/L (ref 135–145)
Total Bilirubin: 1 mg/dL (ref 0.2–1.2)
Total Protein: 6.4 g/dL (ref 6.0–8.3)

## 2023-06-13 LAB — HEMOGLOBIN A1C: Hgb A1c MFr Bld: 6.5 % (ref 4.6–6.5)

## 2023-06-20 ENCOUNTER — Ambulatory Visit: Payer: Medicare Other | Admitting: Internal Medicine

## 2023-06-20 ENCOUNTER — Encounter: Payer: Self-pay | Admitting: Internal Medicine

## 2023-06-20 VITALS — BP 120/60 | HR 68 | Temp 98.6°F | Ht 73.0 in | Wt 191.0 lb

## 2023-06-20 DIAGNOSIS — Z7984 Long term (current) use of oral hypoglycemic drugs: Secondary | ICD-10-CM

## 2023-06-20 DIAGNOSIS — T148XXA Other injury of unspecified body region, initial encounter: Secondary | ICD-10-CM | POA: Diagnosis not present

## 2023-06-20 DIAGNOSIS — R809 Proteinuria, unspecified: Secondary | ICD-10-CM | POA: Diagnosis not present

## 2023-06-20 DIAGNOSIS — E1169 Type 2 diabetes mellitus with other specified complication: Secondary | ICD-10-CM | POA: Diagnosis not present

## 2023-06-20 DIAGNOSIS — Z0000 Encounter for general adult medical examination without abnormal findings: Secondary | ICD-10-CM

## 2023-06-20 DIAGNOSIS — I2510 Atherosclerotic heart disease of native coronary artery without angina pectoris: Secondary | ICD-10-CM | POA: Diagnosis not present

## 2023-06-20 DIAGNOSIS — E669 Obesity, unspecified: Secondary | ICD-10-CM

## 2023-06-20 MED ORDER — DOXYCYCLINE HYCLATE 100 MG PO TABS: 100.0000 mg | ORAL_TABLET | Freq: Two times a day (BID) | ORAL | 0 refills | Status: DC

## 2023-06-20 NOTE — Assessment & Plan Note (Signed)
Cont on Glipizide, Metformin, Jardiance  

## 2023-06-20 NOTE — Assessment & Plan Note (Signed)
R index finger swollen callus - ?brass splinter X ray offered Epsom salt soaks Doxy if worse

## 2023-06-20 NOTE — Assessment & Plan Note (Signed)
No ischemia F/u w/Dr Anne Fu Cont on Lipitor, ASA

## 2023-06-20 NOTE — Assessment & Plan Note (Signed)
Monitor urine microalbumin

## 2023-06-20 NOTE — Progress Notes (Signed)
Subjective:  Patient ID: [name] [name]., male    DOB: [birth year]  Age: 72 y.o. MRN: [id number]  CC: Medical Management of Chronic Issues (3 mnth f/u)   HPI Makih Severson [employer]. presents for DM, HTN, CAD  C/o R index finger swollen callus - ? Brass splinter x 2 wks  Outpatient Medications Prior to Visit  Medication Sig Dispense Refill   ACCU-CHEK GUIDE test strip USE TO TEST BLOOD SUGAR FIVE TIMES A DAY 450 strip 1   aspirin EC 81 MG tablet Take 1 tablet (81 mg total) by mouth daily. Swallow whole. 90 tablet 3   atorvastatin (LIPITOR) 80 MG tablet TAKE 1 TABLET BY MOUTH EVERY DAY 90 tablet 2   atovaquone-proguanil (MALARONE) 250-100 MG TABS tablet TAKE AS DIRECTED 60 tablet 1   Blood Glucose Monitoring Suppl (ACCU-CHEK GUIDE ME) w/Device KIT 1 Units by Does not apply route 5 (five) times daily. 1 kit 1   buPROPion (WELLBUTRIN XL) 150 MG 24 hr tablet TAKE 1 TABLET BY MOUTH EVERY DAY 90 tablet 1   Cholecalciferol 1000 UNITS tablet Take 1 tablet (1,000 Units total) by mouth daily. 200 tablet 3   enalapril (VASOTEC) 5 MG tablet TAKE 2 TABLETS BY MOUTH EVERY DAY 180 tablet 1   glipiZIDE (GLUCOTROL XL) 5 MG 24 hr tablet TAKE 2 TABLETS BY MOUTH IN AM OR ONE TWICE A DAY 180 tablet 2   ibuprofen (ADVIL) 600 MG tablet TAKE 1 TABLET (600 MG TOTAL) BY MOUTH EVERY 4 (FOUR) HOURS AS NEEDED. FOR PAIN 30 tablet 0   JARDIANCE 10 MG TABS tablet TAKE 1 TABLET BY MOUTH EVERY DAY 90 tablet 2   Lancets (ONETOUCH DELICA PLUS LANCET33G) MISC USE TO HELP CHECK BLOOD SUGARS 5 TIMES A DAY 500 each 3   meclizine (ANTIVERT) 12.5 MG tablet TAKE 1 TABLET BY MOUTH THREE TIMES A DAY AS NEEDED 90 tablet 1   metFORMIN (GLUCOPHAGE) 1000 MG tablet TAKE 1 TABLET BY MOUTH TWICE A DAY WITH FOOD 180 tablet 2   niacin (VITAMIN B3) 500 MG ER tablet TAKE 1 TABLET BY MOUTH EVERYDAY AT BEDTIME 90 tablet 3   OMEGA 3-6-9 FATTY ACIDS PO Take 1 tablet by mouth daily.     propranolol (INDERAL) 10 MG tablet TAKE 1 TO 2 TABLETS BY  MOUTH EVERY DAY AS NEEDED 180 tablet 1   silodosin (RAPAFLO) 8 MG CAPS capsule TAKE 1 CAPSULE BY MOUTH EVERY DAY 90 capsule 2   tadalafil (CIALIS) 5 MG tablet TAKE ONE TABLET DAILY FOR BPH 90 tablet 2   valACYclovir (VALTREX) 500 MG tablet TAKE 1 TABLET BY MOUTH THREE TIMES A DAY 21 tablet 2   diphenhydrAMINE (BENADRYL) 25 MG tablet Take 1-2 tablets (25-50 mg total) by mouth every 8 (eight) hours as needed for allergies. (Patient not taking: Reported on 06/20/2023) 60 tablet 5   indomethacin (INDOCIN SR) 75 MG CR capsule TAKE 1 CAPSULE BY MOUTH TWICE A DAY WITH MEAL (Patient not taking: Reported on 06/20/2023) 60 capsule 1   No facility-administered medications prior to visit.    ROS: Review of Systems  Constitutional:  Negative for appetite change, fatigue and unexpected weight change.  HENT:  Negative for congestion, nosebleeds, sneezing, sore throat and trouble swallowing.   Eyes:  Negative for itching and visual disturbance.  Respiratory:  Negative for cough.   Cardiovascular:  Negative for chest pain, palpitations and leg swelling.  Gastrointestinal:  Negative for abdominal distention, blood in stool, diarrhea and nausea.  Genitourinary:  Negative for frequency and hematuria.  Musculoskeletal:  Negative for back pain, gait problem, joint swelling and neck pain.  Skin:  Negative for rash.  Neurological:  Negative for dizziness, tremors, speech difficulty and weakness.  Psychiatric/Behavioral:  Negative for agitation, dysphoric mood and sleep disturbance. The patient is not nervous/anxious.     Objective:  BP 120/60 (BP Location: Right Arm, Patient Position: Sitting, Cuff Size: Normal)   Pulse 68   Temp 98.6 F (37 C) (Oral)   Ht 6\' 1"  (1.854 m)   Wt 191 lb (86.6 kg)   SpO2 91%   BMI 25.20 kg/m   BP Readings from Last 3 Encounters:  06/20/23 120/60  03/21/23 118/60  12/19/22 118/72    Wt Readings from Last 3 Encounters:  06/20/23 191 lb (86.6 kg)  03/21/23 197 lb (89.4  kg)  12/19/22 197 lb (89.4 kg)    Physical Exam Constitutional:      General: He is not in acute distress.    Appearance: He is well-developed.     Comments: NAD  Eyes:     Conjunctiva/sclera: Conjunctivae normal.     Pupils: Pupils are equal, round, and reactive to light.  Neck:     Thyroid: No thyromegaly.     Vascular: No JVD.  Cardiovascular:     Rate and Rhythm: Normal rate and regular rhythm.     Heart sounds: Normal heart sounds. No murmur heard.    No friction rub. No gallop.  Pulmonary:     Effort: Pulmonary effort is normal. No respiratory distress.     Breath sounds: Normal breath sounds. No wheezing or rales.  Chest:     Chest wall: No tenderness.  Abdominal:     General: Bowel sounds are normal. There is no distension.     Palpations: Abdomen is soft. There is no mass.     Tenderness: There is no abdominal tenderness. There is no guarding or rebound.  Musculoskeletal:        General: No tenderness. Normal range of motion.     Cervical back: Normal range of motion.  Lymphadenopathy:     Cervical: No cervical adenopathy.  Skin:    General: Skin is warm and dry.     Findings: Lesion present. No rash.  Neurological:     Mental Status: He is alert and oriented to person, place, and time.     Cranial Nerves: No cranial nerve deficit.     Motor: No abnormal muscle tone.     Coordination: Coordination normal.     Gait: Gait normal.     Deep Tendon Reflexes: Reflexes are normal and symmetric.  Psychiatric:        Behavior: Behavior normal.        Thought Content: Thought content normal.        Judgment: Judgment normal.    R index finger w/a swollen callus - ?splinter  Lab Results  Component Value Date   WBC 5.9 09/11/2022   HGB 15.7 09/11/2022   HCT 45.8 09/11/2022   PLT 170.0 09/11/2022   GLUCOSE 142 (H) 06/13/2023   CHOL 112 09/11/2022   TRIG 145.0 09/11/2022   HDL 34.40 (L) 09/11/2022   LDLDIRECT 71.0 03/05/2019   LDLCALC 48 09/11/2022   ALT 20  06/13/2023   AST 18 06/13/2023   NA 141 06/13/2023   K 3.5 06/13/2023   CL 104 06/13/2023   CREATININE 1.19 06/13/2023   BUN 20 06/13/2023   CO2 26 06/13/2023  TSH 1.81 09/11/2022   PSA 0.95 09/11/2022   INR 1.3 04/08/2008   HGBA1C 6.5 06/13/2023   MICROALBUR 9.5 (H) 08/31/2021    US RENAL Result Date: 12/09/2020 CLINICAL DATA:  Decreased GFR. EXAM: RENAL / URINARY TRACT ULTRASOUND COMPLETE COMPARISON:  None. FINDINGS: Right Kidney: Renal measurements: 12.0 x 5.3 x 5.1 cm = volume: 168 mL. Echogenicity within normal limits. No mass or hydronephrosis visualized. Left Kidney: Renal measurements: 12.3 x 5.5 x 4.8 cm = volume: 169 mL. Echogenicity within normal limits. No mass or hydronephrosis visualized. Bladder: Appears normal for degree of bladder distention. Bilateral ureteral jets are noted. Other: Mild prostatic enlargement is noted. IMPRESSION: Kidneys are unremarkable.  Mild prostatic enlargement. Electronically Signed   By: Lupita Raider M.D.   On: 12/09/2020 15:00    Assessment & Plan:   Problem List Items Addressed This Visit     Diabetes mellitus type 2 in obese - Primary   Cont on Glipizide, Metformin, Jardiance        Relevant Orders   Hemoglobin A1c   Microalbumin / creatinine urine ratio   MICROALBUMINURIA   Monitor urine microalbumin      CAD (coronary artery disease)   No ischemia F/u w/Dr Anne Fu Cont on Lipitor, ASA      Well adult exam   Relevant Orders   TSH   Urinalysis   CBC with Differential/Platelet   Lipid panel   PSA   Comprehensive metabolic panel   Hemoglobin A1c   Microalbumin / creatinine urine ratio   Splinter in skin   R index finger swollen callus - ?brass splinter X ray offered Epsom salt soaks Doxy if worse         Meds ordered this encounter  Medications   doxycycline (VIBRA-TABS) 100 MG tablet    Sig: Take 1 tablet (100 mg total) by mouth 2 (two) times daily.    Dispense:  20 tablet    Refill:  0       Follow-up: Return in about 3 months (around 09/18/2023) for Wellness Exam.  Sonda Primes, MD

## 2023-07-29 ENCOUNTER — Other Ambulatory Visit: Payer: Self-pay | Admitting: Internal Medicine

## 2023-08-04 ENCOUNTER — Other Ambulatory Visit: Payer: Self-pay | Admitting: Internal Medicine

## 2023-08-07 ENCOUNTER — Ambulatory Visit: Payer: Medicare Other

## 2023-08-07 VITALS — BP 128/70 | HR 62 | Ht 71.0 in | Wt 200.2 lb

## 2023-08-07 DIAGNOSIS — Z0000 Encounter for general adult medical examination without abnormal findings: Secondary | ICD-10-CM | POA: Diagnosis not present

## 2023-08-07 NOTE — Progress Notes (Addendum)
 Subjective:   [name] [name]. is a 73 y.o. male who presents for Medicare Annual/Subsequent preventive examination.  Visit Complete: In person   Cardiac Risk Factors include: advanced age (>41men, >37 women);male gender;hypertension;diabetes mellitus;Other (see comment);dyslipidemia, Risk factor comments: CAd, BPH     Objective:    Today's Vitals   08/07/23 0941  BP: 128/70  Pulse: 62  SpO2: 99%  Weight: 200 lb 3.2 oz (90.8 kg)  Height: 5' 11 (1.803 m)   Body mass index is 27.92 kg/m.     08/07/2023    9:46 AM 08/08/2022    2:06 PM 06/13/2021    3:15 PM 04/22/2020   10:09 AM 09/27/2015    1:35 PM  Advanced Directives  Does Patient Have a Medical Advance Directive? No No Yes Yes No  Type of Advance Directive   Living will;Healthcare Power of Attorney    Does patient want to make changes to medical advance directive?   No - Patient declined No - Patient declined   Copy of Healthcare Power of Attorney in Chart?   No - copy requested    Would patient like information on creating a medical advance directive? No - Patient declined No - Patient declined       Current Medications (verified) Outpatient Encounter Medications as of 08/07/2023  Medication Sig   ACCU-CHEK GUIDE test strip USE TO TEST BLOOD SUGAR FIVE TIMES A DAY   aspirin  EC 81 MG tablet Take 1 tablet (81 mg total) by mouth daily. Swallow whole.   atorvastatin  (LIPITOR) 80 MG tablet TAKE 1 TABLET BY MOUTH EVERY DAY   atovaquone -proguanil (MALARONE ) 250-100 MG TABS tablet TAKE AS DIRECTED   Blood Glucose Monitoring Suppl (ACCU-CHEK GUIDE ME) w/Device KIT 1 Units by Does not apply route 5 (five) times daily.   buPROPion  (WELLBUTRIN  XL) 150 MG 24 hr tablet TAKE 1 TABLET BY MOUTH EVERY DAY   Cholecalciferol  1000 UNITS tablet Take 1 tablet (1,000 Units total) by mouth daily.   diphenhydrAMINE  (BENADRYL ) 25 MG tablet Take 1-2 tablets (25-50 mg total) by mouth every 8 (eight) hours as needed for allergies.   doxycycline   (VIBRA -TABS) 100 MG tablet TAKE 1 TABLET BY MOUTH TWICE A DAY   enalapril  (VASOTEC ) 5 MG tablet TAKE 2 TABLETS BY MOUTH EVERY DAY   glipiZIDE  (GLUCOTROL  XL) 5 MG 24 hr tablet TAKE 2 TABLETS BY MOUTH IN AM OR ONE TWICE A DAY   ibuprofen  (ADVIL ) 600 MG tablet TAKE 1 TABLET (600 MG TOTAL) BY MOUTH EVERY 4 (FOUR) HOURS AS NEEDED. FOR PAIN   indomethacin  (INDOCIN  SR) 75 MG CR capsule TAKE 1 CAPSULE BY MOUTH TWICE A DAY WITH MEAL   JARDIANCE  10 MG TABS tablet TAKE 1 TABLET BY MOUTH EVERY DAY   Lancets (ONETOUCH DELICA PLUS LANCET33G) MISC USE TO HELP CHECK BLOOD SUGARS 5 TIMES A DAY   meclizine  (ANTIVERT ) 12.5 MG tablet TAKE 1 TABLET BY MOUTH THREE TIMES A DAY AS NEEDED   metFORMIN  (GLUCOPHAGE ) 1000 MG tablet TAKE 1 TABLET BY MOUTH TWICE A DAY WITH FOOD   niacin  (VITAMIN B3) 500 MG ER tablet TAKE 1 TABLET BY MOUTH EVERYDAY AT BEDTIME   OMEGA 3-6-9 FATTY ACIDS PO Take 1 tablet by mouth daily.   propranolol  (INDERAL ) 10 MG tablet TAKE 1 TO 2 TABLETS BY MOUTH EVERY DAY AS NEEDED   silodosin  (RAPAFLO ) 8 MG CAPS capsule TAKE 1 CAPSULE BY MOUTH EVERY DAY   tadalafil  (CIALIS ) 5 MG tablet TAKE ONE TABLET DAILY FOR  BPH   valACYclovir  (VALTREX ) 500 MG tablet TAKE 1 TABLET BY MOUTH THREE TIMES A DAY   No facility-administered encounter medications on file as of 08/07/2023.    Allergies (verified) Iohexol and Sulfadiazine   History: Past Medical History:  Diagnosis Date   Aortic root dilatation (HCC)    slight, echo 2009   Arthritis    WRISTS, HIP   Blood transfusion without reported diagnosis    CAD (coronary artery disease)    Nuclear, Jul 04, 2009 NO ischemia   Cataract    Diabetes mellitus    Diverticulosis    Heart murmur    Herpes simplex    Hx of CABG 04/01/2008   October, 2009   Hyperlipidemia    Hypertension    Kidney cysts    ~09   Low testosterone  07/02/2008   LOW DHEA   Microalbuminuria    Snoring    prior history of surgery for sleep apnea   Past Surgical History:  Procedure  Laterality Date   APPENDECTOMY  01/1976   CHOLECYSTECTOMY  02/23/2008   COLONOSCOPY     CORONARY ARTERY BYPASS GRAFT  04/08/2008   POLYPECTOMY     Removal of UVULA  1999   ENT   TONSILECTOMY, ADENOIDECTOMY, BILATERAL MYRINGOTOMY AND TUBES     NO PE TUBES PER PT - NOSE SHAVED AS WELL   Family History  Problem Relation Age of Onset   Diabetes Mother    Mental illness Mother 59       Alzheimer   Diabetes Father    Heart disease Father        CAD   Hypertension Other    Colon cancer Neg Hx    Colon polyps Neg Hx    Esophageal cancer Neg Hx    Rectal cancer Neg Hx    Stomach cancer Neg Hx    Social History   Socioeconomic History   Marital status: Married    Spouse name: Heron   Number of children: 5   Years of education: Not on file   Highest education level: Master's degree (e.g., MA, MS, MEng, MEd, MSW, MBA)  Occupational History   Occupation: RETIRED  Tobacco Use   Smoking status: Never   Smokeless tobacco: Never  Vaping Use   Vaping status: Never Used  Substance and Sexual Activity   Alcohol use: No    Alcohol/week: 0.0 standard drinks of alcohol   Drug use: No   Sexual activity: Yes  Other Topics Concern   Not on file  Social History Narrative   Travels a lotRegular exercise - Yes, 4,000-6,000 steps daily      Lives with wife   Social Drivers of Corporate Investment Banker Strain: Low Risk  (08/07/2023)   Overall Financial Resource Strain (CARDIA)    Difficulty of Paying Living Expenses: Not hard at all  Food Insecurity: No Food Insecurity (08/07/2023)   Hunger Vital Sign    Worried About Running Out of Food in the Last Year: Never true    Ran Out of Food in the Last Year: Never true  Transportation Needs: No Transportation Needs (08/07/2023)   PRAPARE - Administrator, Civil Service (Medical): No    Lack of Transportation (Non-Medical): No  Physical Activity: Sufficiently Active (08/07/2023)   Exercise Vital Sign    Days of Exercise per Week:  7 days    Minutes of Exercise per Session: 30 min  Stress: No Stress Concern Present (08/07/2023)   [place]  [employer] - Occupational Stress Questionnaire    Feeling of Stress : Not at all  Social Connections: Socially Integrated (08/07/2023)   Social Connection and Isolation Panel [NHANES]    Frequency of Communication with Friends and Family: Twice a week    Frequency of Social Gatherings with Friends and Family: Once a week    Attends Religious Services: More than 4 times per year    Active Member of [employer] or Organizations: Yes    Attends Engineer, Structural: More than 4 times per year    Marital Status: Married    Tobacco Counseling Counseling given: Not Answered   Clinical Intake:  Pre-visit preparation completed: Yes  Pain : No/denies pain     BMI - recorded: 27.92 Nutritional Status: BMI 25 -29 Overweight Nutritional Risks: None Diabetes: Yes CBG done?: Yes (132) CBG resulted in Enter/ Edit results?: No Did pt. bring in CBG monitor from home?: No  How often do you need to have someone help you when you read instructions, pamphlets, or other written materials from your doctor or pharmacy?: 1 - Never     Information entered by :: Maddie Brazier, RMA   Activities of Daily Living    08/07/2023    9:37 AM 08/08/2022    2:13 PM  In your present state of health, do you have any difficulty performing the following activities:  Hearing? 0 0  Vision? 0 0  Difficulty concentrating or making decisions? 0 0  Walking or climbing stairs? 0 0  Dressing or bathing? 0 0  Doing errands, shopping? 0 0  Preparing Food and eating ? N N  Using the Toilet? N N  In the past six months, have you accidently leaked urine? N N  Do you have problems with loss of bowel control? N N  Managing your Medications? N N  Managing your Finances? N N  Housekeeping or managing your Housekeeping? N N    Patient Care Team: Plotnikov, Karlynn GAILS, MD as PCP -  General Jeffrie Oneil BROCKS, MD as PCP - Cardiology (Cardiology) Cleotilde Sewer, OD as Consulting Physician (Optometry)  Indicate any recent Medical Services you may have received from other than [facility] providers in the past year (date may be approximate).     Assessment:   This is a routine wellness examination for Cosby.  Hearing/Vision screen Hearing Screening - Comments:: Denies hearing difficulties   Vision Screening - Comments:: Wears eyeglasses   Goals Addressed               This Visit's Progress     Patient Stated (pt-stated)   On track     My goal is to keep breathing.      Depression Screen    08/07/2023    9:52 AM 06/20/2023    9:52 AM 12/19/2022    7:43 AM 09/18/2022    8:39 AM 08/08/2022    2:13 PM 06/19/2022    8:06 AM 06/13/2021    3:12 PM  PHQ 2/9 Scores  PHQ - 2 Score 3 0 0 0 0 0 0  PHQ- 9 Score 3   0 2 1     Fall Risk    08/07/2023    9:47 AM 06/20/2023    9:52 AM 12/19/2022    7:43 AM 09/18/2022    8:39 AM 08/08/2022    2:06 PM  Fall Risk   Falls in the past year? 0 0 0 0 0  Number falls in past yr:  0 0 0 0 0  Injury with Fall? 0 0 0 0 0  Risk for fall due to : No Fall Risks No Fall Risks No Fall Risks No Fall Risks No Fall Risks  Follow up Falls prevention discussed;Falls evaluation completed Falls evaluation completed Falls evaluation completed Falls evaluation completed Falls prevention discussed    MEDICARE RISK AT HOME: Medicare Risk at Home Any stairs in or around the home?: No Home free of loose throw rugs in walkways, pet beds, electrical cords, etc?: Yes Adequate lighting in your home to reduce risk of falls?: Yes Life alert?: No Use of a cane, walker or w/c?: No Grab bars in the bathroom?: No Shower chair or bench in shower?: No Elevated toilet seat or a handicapped toilet?: No  TIMED UP AND GO:  Was the test performed?  Yes  Length of time to ambulate 10 feet: 10 sec Gait steady and fast without use of assistive device     Cognitive Function:        08/07/2023    9:37 AM 08/08/2022    2:12 PM  6CIT Screen  What Year? 0 points 0 points  What month? 0 points 0 points  What time? 0 points 0 points  Count back from 20 0 points 0 points  Months in reverse 0 points 0 points  Repeat phrase 0 points 0 points  Total Score 0 points 0 points    Immunizations Immunization History  Administered Date(s) Administered   Fluad Quad(high Dose 65+) 03/16/2019, 04/20/2020, 06/05/2021, 03/14/2022   Fluad Trivalent(High Dose 65+) 03/27/2023   H1N1 06/07/2008   Hep A / Hep B 12/13/2017, 01/14/2018, 06/17/2018   Hepatitis A 05/17/1999   Influenza Split 05/11/2011   Influenza Whole 05/06/2008, 04/01/2009, 03/30/2010, 04/02/2012   Influenza, Quadrivalent, Recombinant, Inj, Pf 03/25/2018   Influenza,inj,Quad PF,6+ Mos 03/10/2013, 05/20/2015   Influenza-Unspecified 03/31/2014, 03/28/2016, 03/27/2017   Meningococcal Mcv4o 12/13/2017   Meningococcal polysaccharide vaccine (MPSV4) 05/24/2010   PFIZER(Purple Top)SARS-COV-2 Vaccination 08/28/2019, 09/22/2019, 04/26/2020   PNEUMOCOCCAL CONJUGATE-20 06/19/2022   Pneumococcal Conjugate-13 06/16/2013   Pneumococcal Polysaccharide-23 08/03/2016   Td 05/24/2010   Tdap 06/17/2015   Zoster Recombinant(Shingrix) 11/05/2017, 01/05/2018   Zoster, Live 03/10/2013    TDAP status: Up to date  Flu Vaccine status: Up to date  Pneumococcal vaccine status: Up to date  Covid-19 vaccine status: Declined, Education has been provided regarding the importance of this vaccine but patient still declined. Advised may receive this vaccine at local pharmacy or Health Dept.or vaccine clinic. Aware to provide a copy of the vaccination record if obtained from local pharmacy or Health Dept. Verbalized acceptance and understanding.  Qualifies for Shingles Vaccine? Yes   Zostavax completed Yes   Shingrix Completed?: Yes  Screening Tests Health Maintenance  Topic Date Due   Diabetic kidney  evaluation - Urine ACR  09/01/2022   COVID-19 Vaccine (4 - 2024-25 season) 03/03/2023   FOOT EXAM  03/15/2023   HEMOGLOBIN A1C  12/12/2023   OPHTHALMOLOGY EXAM  04/03/2024   Diabetic kidney evaluation - eGFR measurement  06/12/2024   Medicare Annual Wellness (AWV)  08/06/2024   DTaP/Tdap/Td (3 - Td or Tdap) 06/16/2025   Colonoscopy  02/25/2028   Pneumonia Vaccine 25+ Years old  Completed   INFLUENZA VACCINE  Completed   Hepatitis C Screening  Completed   Zoster Vaccines- Shingrix  Completed   HPV VACCINES  Aged Out    Health Maintenance  Health Maintenance Due  Topic Date Due   Diabetic kidney  evaluation - Urine ACR  09/01/2022   COVID-19 Vaccine (4 - 2024-25 season) 03/03/2023   FOOT EXAM  03/15/2023    Colorectal cancer screening: Type of screening: Colonoscopy. Completed 02/24/2021. Repeat every 7 years  Lung Cancer Screening: (Low Dose CT Chest recommended if Age 29-80 years, 20 pack-year currently smoking OR have quit w/in 15years.) does not qualify.   Lung Cancer Screening Referral: n/a  Additional Screening:  Hepatitis C Screening: does qualify; Completed 12/06/2017  Vision Screening: Recommended annual ophthalmology exams for early detection of glaucoma and other disorders of the eye. Is the patient up to date with their annual eye exam?  Yes  Who is the provider or what is the name of the office in which the patient attends annual eye exams? Dr. Cleotilde If pt is not established with a provider, would they like to be referred to a provider to establish care? No .   Dental Screening: Recommended annual dental exams for proper oral hygiene  Diabetic Foot Exam: Diabetic Foot Exam: Overdue, Pt has been advised about the importance in completing this exam. Pt is scheduled for diabetic foot exam on 09/23/23.  Community Resource Referral / Chronic Care Management: CRR required this visit?  No   CCM required this visit?  No     Plan:     I have personally reviewed  and noted the following in the patient's chart:   Medical and social history Use of alcohol, tobacco or illicit drugs  Current medications and supplements including opioid prescriptions. Patient is not currently taking opioid prescriptions. Functional ability and status Nutritional status Physical activity Advanced directives List of other physicians Hospitalizations, surgeries, and ER visits in previous 12 months Vitals Screenings to include cognitive, depression, and falls Referrals and appointments  In addition, I have reviewed and discussed with patient certain preventive protocols, quality metrics, and best practice recommendations. A written personalized care plan for preventive services as well as general preventive health recommendations were provided to patient.     Cindia Hustead L Eastyn Dattilo, CMA   08/07/2023   After Visit Summary: (MyChart) Due to this being a telephonic visit, the after visit summary with patients personalized plan was offered to patient via MyChart   Nurse Notes: Patient is due for a foot exam and a UACR and the order has been placed today.  Patient will like to get this done during his up coming visit with Dr. Garald.  He had  no other concerns to address today.   Medical screening examination/treatment/procedure(s) were performed by non-physician practitioner and as supervising physician I was immediately available for consultation/collaboration.  I agree with above. Karlynn Garald, MD

## 2023-08-07 NOTE — Patient Instructions (Signed)
 [name] [name] , Thank you for taking time to come for your Medicare Wellness Visit. I appreciate your ongoing commitment to your health goals. Please review the following plan we discussed and let me know if I can assist you in the future.   Referrals/Orders/Follow-Ups/Clinician Recommendations: It was nice to meet you today.  You are due for a foot exam and a Diabetic kidney evaluation - Urine, which will be done during your up coming office visit.  Keep up the good work.  This is a list of the screening recommended for you and due dates:  Health Maintenance  Topic Date Due   Yearly kidney health urinalysis for diabetes  09/01/2022   COVID-19 Vaccine (4 - 2024-25 season) 03/03/2023   Complete foot exam   03/15/2023   Hemoglobin A1C  12/12/2023   Eye exam for diabetics  04/03/2024   Yearly kidney function blood test for diabetes  06/12/2024   Medicare Annual Wellness Visit  08/06/2024   DTaP/Tdap/Td vaccine (3 - Td or Tdap) 06/16/2025   Colon Cancer Screening  02/25/2028   Pneumonia Vaccine  Completed   Flu Shot  Completed   Hepatitis C Screening  Completed   Zoster (Shingles) Vaccine  Completed   HPV Vaccine  Aged Out    Advanced directives: (Declined) Advance directive discussed with you today. Even though you declined this today, please call our office should you change your mind, and we can give you the proper paperwork for you to fill out.  Next Medicare Annual Wellness Visit scheduled for next year: Yes

## 2023-09-16 ENCOUNTER — Other Ambulatory Visit (INDEPENDENT_AMBULATORY_CARE_PROVIDER_SITE_OTHER)

## 2023-09-16 DIAGNOSIS — E669 Obesity, unspecified: Secondary | ICD-10-CM

## 2023-09-16 DIAGNOSIS — Z0000 Encounter for general adult medical examination without abnormal findings: Secondary | ICD-10-CM | POA: Diagnosis not present

## 2023-09-16 DIAGNOSIS — Z125 Encounter for screening for malignant neoplasm of prostate: Secondary | ICD-10-CM

## 2023-09-16 DIAGNOSIS — E1169 Type 2 diabetes mellitus with other specified complication: Secondary | ICD-10-CM | POA: Diagnosis not present

## 2023-09-16 LAB — CBC WITH DIFFERENTIAL/PLATELET
Basophils Absolute: 0.1 10*3/uL (ref 0.0–0.1)
Basophils Relative: 0.8 % (ref 0.0–3.0)
Eosinophils Absolute: 0.2 10*3/uL (ref 0.0–0.7)
Eosinophils Relative: 3 % (ref 0.0–5.0)
HCT: 43.9 % (ref 39.0–52.0)
Hemoglobin: 14.9 g/dL (ref 13.0–17.0)
Lymphocytes Relative: 30.7 % (ref 12.0–46.0)
Lymphs Abs: 2.1 10*3/uL (ref 0.7–4.0)
MCHC: 34 g/dL (ref 30.0–36.0)
MCV: 96.6 fl (ref 78.0–100.0)
Monocytes Absolute: 0.6 10*3/uL (ref 0.1–1.0)
Monocytes Relative: 9.1 % (ref 3.0–12.0)
Neutro Abs: 3.9 10*3/uL (ref 1.4–7.7)
Neutrophils Relative %: 56.4 % (ref 43.0–77.0)
Platelets: 210 10*3/uL (ref 150.0–400.0)
RBC: 4.54 Mil/uL (ref 4.22–5.81)
RDW: 13.1 % (ref 11.5–15.5)
WBC: 7 10*3/uL (ref 4.0–10.5)

## 2023-09-16 LAB — HEMOGLOBIN A1C: Hgb A1c MFr Bld: 6.7 % — ABNORMAL HIGH (ref 4.6–6.5)

## 2023-09-16 LAB — URINALYSIS, ROUTINE W REFLEX MICROSCOPIC
Bilirubin Urine: NEGATIVE
Hgb urine dipstick: NEGATIVE
Ketones, ur: NEGATIVE
Leukocytes,Ua: NEGATIVE
Nitrite: NEGATIVE
RBC / HPF: NONE SEEN (ref 0–?)
Specific Gravity, Urine: 1.025 (ref 1.000–1.030)
Total Protein, Urine: 30 — AB
Urine Glucose: >=1000 — AB
Urobilinogen, UA: 0.2 (ref 0.0–1.0)
pH: 6 (ref 5.0–8.0)

## 2023-09-16 LAB — COMPREHENSIVE METABOLIC PANEL
ALT: 17 U/L (ref 0–53)
AST: 15 U/L (ref 0–37)
Albumin: 4.1 g/dL (ref 3.5–5.2)
Alkaline Phosphatase: 46 U/L (ref 39–117)
BUN: 15 mg/dL (ref 6–23)
CO2: 26 meq/L (ref 19–32)
Calcium: 9 mg/dL (ref 8.4–10.5)
Chloride: 107 meq/L (ref 96–112)
Creatinine, Ser: 1.13 mg/dL (ref 0.40–1.50)
GFR: 64.76 mL/min (ref >60.00)
Glucose, Bld: 146 mg/dL — ABNORMAL HIGH (ref 70–99)
Potassium: 4 meq/L (ref 3.5–5.1)
Sodium: 141 meq/L (ref 135–145)
Total Bilirubin: 1 mg/dL (ref 0.2–1.2)
Total Protein: 6.3 g/dL (ref 6.0–8.3)

## 2023-09-16 LAB — MICROALBUMIN / CREATININE URINE RATIO
Creatinine,U: 118.5 mg/dL
Microalb Creat Ratio: 158.3 mg/g — ABNORMAL HIGH (ref 0.0–30.0)
Microalb, Ur: 18.8 mg/dL — ABNORMAL HIGH (ref 0.0–1.9)

## 2023-09-16 LAB — LIPID PANEL
Cholesterol: 105 mg/dL (ref 0–200)
HDL: 38.1 mg/dL — ABNORMAL LOW (ref >39.00)
LDL Cholesterol: 45 mg/dL (ref 0–99)
NonHDL: 66.57
Total CHOL/HDL Ratio: 3
Triglycerides: 110 mg/dL (ref 0.0–149.0)
VLDL: 22 mg/dL (ref 0.0–40.0)

## 2023-09-16 LAB — TSH: TSH: 1.54 u[IU]/mL (ref 0.35–5.50)

## 2023-09-16 LAB — PSA: PSA: 2.39 ng/mL (ref 0.10–4.00)

## 2023-09-23 ENCOUNTER — Ambulatory Visit (INDEPENDENT_AMBULATORY_CARE_PROVIDER_SITE_OTHER): Payer: Medicare Other | Admitting: Internal Medicine

## 2023-09-23 ENCOUNTER — Encounter: Payer: Self-pay | Admitting: Internal Medicine

## 2023-09-23 VITALS — BP 104/54 | HR 66 | Temp 98.3°F | Ht 71.0 in | Wt 192.2 lb

## 2023-09-23 DIAGNOSIS — Z0000 Encounter for general adult medical examination without abnormal findings: Secondary | ICD-10-CM | POA: Diagnosis not present

## 2023-09-23 DIAGNOSIS — B351 Tinea unguium: Secondary | ICD-10-CM

## 2023-09-23 DIAGNOSIS — R972 Elevated prostate specific antigen [PSA]: Secondary | ICD-10-CM

## 2023-09-23 DIAGNOSIS — R809 Proteinuria, unspecified: Secondary | ICD-10-CM

## 2023-09-23 DIAGNOSIS — R944 Abnormal results of kidney function studies: Secondary | ICD-10-CM | POA: Diagnosis not present

## 2023-09-23 DIAGNOSIS — E669 Obesity, unspecified: Secondary | ICD-10-CM

## 2023-09-23 DIAGNOSIS — E1169 Type 2 diabetes mellitus with other specified complication: Secondary | ICD-10-CM | POA: Diagnosis not present

## 2023-09-23 DIAGNOSIS — Z7984 Long term (current) use of oral hypoglycemic drugs: Secondary | ICD-10-CM | POA: Diagnosis not present

## 2023-09-23 MED ORDER — EMPAGLIFLOZIN 25 MG PO TABS
25.0000 mg | ORAL_TABLET | Freq: Every day | ORAL | 3 refills | Status: AC
Start: 2023-09-23 — End: ?

## 2023-09-23 MED ORDER — CICLOPIROX 8 % EX SOLN: Freq: Every day | CUTANEOUS | 1 refills | Status: DC

## 2023-09-23 NOTE — Assessment & Plan Note (Signed)
  We discussed age appropriate health related issues, including available/recomended screening tests and vaccinations. Labs were ordered to be later reviewed . All questions were answered. We discussed one or more of the following - seat belt use, use of sunscreen/sun exposure exercise, safe sex, fall risk reduction, second hand smoke exposure, firearm use and storage, seat belt use, a need for adhering to healthy diet and exercise. Labs were ordered.  All questions were answered. Colon 2022, due in 2029 - Dr Marina Goodell

## 2023-09-23 NOTE — Patient Instructions (Signed)
 [email or web address]

## 2023-09-23 NOTE — Assessment & Plan Note (Addendum)
 Cont on Glipizide, Metformin  Jardiance - increase to 25 mg/d

## 2023-09-23 NOTE — Assessment & Plan Note (Signed)
 Worse: repeat in 3 months

## 2023-09-23 NOTE — Assessment & Plan Note (Signed)
 B feet [place]

## 2023-09-23 NOTE — Progress Notes (Signed)
 Subjective:  Patient ID: [name] [name] Hageman., male    DOB: 1950-12-10  Age: 73 y.o. MRN: [id number]  CC: Annual Exam (Annual Exam, requesting diabetic foot exam)   HPI [name] Morrow Louthan Jr. presents for a well exam C/o nail fungus, DM2  Outpatient Medications Prior to Visit  Medication Sig Dispense Refill   ACCU-CHEK GUIDE test strip USE TO TEST BLOOD SUGAR FIVE TIMES A DAY 450 strip 1   aspirin EC 81 MG tablet Take 1 tablet (81 mg total) by mouth daily. Swallow whole. 90 tablet 3   atorvastatin (LIPITOR) 80 MG tablet TAKE 1 TABLET BY MOUTH EVERY DAY 90 tablet 2   Blood Glucose Monitoring Suppl (ACCU-CHEK GUIDE ME) w/Device KIT 1 Units by Does not apply route 5 (five) times daily. 1 kit 1   buPROPion (WELLBUTRIN XL) 150 MG 24 hr tablet TAKE 1 TABLET BY MOUTH EVERY DAY 90 tablet 1   Cholecalciferol 1000 UNITS tablet Take 1 tablet (1,000 Units total) by mouth daily. 200 tablet 3   diphenhydrAMINE (BENADRYL) 25 MG tablet Take 1-2 tablets (25-50 mg total) by mouth every 8 (eight) hours as needed for allergies. 60 tablet 5   doxycycline (VIBRA-TABS) 100 MG tablet TAKE 1 TABLET BY MOUTH TWICE A DAY 20 tablet 0   enalapril (VASOTEC) 5 MG tablet TAKE 2 TABLETS BY MOUTH EVERY DAY 180 tablet 1   glipiZIDE (GLUCOTROL XL) 5 MG 24 hr tablet TAKE 2 TABLETS BY MOUTH IN AM OR ONE TWICE A DAY 180 tablet 2   ibuprofen (ADVIL) 600 MG tablet TAKE 1 TABLET (600 MG TOTAL) BY MOUTH EVERY 4 (FOUR) HOURS AS NEEDED. FOR PAIN 30 tablet 0   indomethacin (INDOCIN SR) 75 MG CR capsule TAKE 1 CAPSULE BY MOUTH TWICE A DAY WITH MEAL 60 capsule 1   Lancets (ONETOUCH DELICA PLUS LANCET33G) MISC USE TO HELP CHECK BLOOD SUGARS 5 TIMES A DAY 500 each 3   meclizine (ANTIVERT) 12.5 MG tablet TAKE 1 TABLET BY MOUTH THREE TIMES A DAY AS NEEDED 90 tablet 1   metFORMIN (GLUCOPHAGE) 1000 MG tablet TAKE 1 TABLET BY MOUTH TWICE A DAY WITH FOOD 180 tablet 2   niacin (VITAMIN B3) 500 MG ER tablet TAKE 1 TABLET BY MOUTH EVERYDAY AT  BEDTIME 90 tablet 3   OMEGA 3-6-9 FATTY ACIDS PO Take 1 tablet by mouth daily.     propranolol (INDERAL) 10 MG tablet TAKE 1 TO 2 TABLETS BY MOUTH EVERY DAY AS NEEDED 180 tablet 1   silodosin (RAPAFLO) 8 MG CAPS capsule TAKE 1 CAPSULE BY MOUTH EVERY DAY 90 capsule 2   tadalafil (CIALIS) 5 MG tablet TAKE ONE TABLET DAILY FOR BPH 90 tablet 2   valACYclovir (VALTREX) 500 MG tablet TAKE 1 TABLET BY MOUTH THREE TIMES A DAY 21 tablet 2   atovaquone-proguanil (MALARONE) 250-100 MG TABS tablet TAKE AS DIRECTED 60 tablet 1   JARDIANCE 10 MG TABS tablet TAKE 1 TABLET BY MOUTH EVERY DAY 90 tablet 2   No facility-administered medications prior to visit.    ROS: Review of Systems  Constitutional:  Negative for appetite change, fatigue and unexpected weight change.  HENT:  Negative for congestion, nosebleeds, sneezing, sore throat and trouble swallowing.   Eyes:  Negative for itching and visual disturbance.  Respiratory:  Negative for cough.   Cardiovascular:  Negative for chest pain, palpitations and leg swelling.  Gastrointestinal:  Negative for abdominal distention, blood in stool, diarrhea and nausea.  Genitourinary:  Negative for  frequency and hematuria.  Musculoskeletal:  Negative for back pain, gait problem, joint swelling and neck pain.  Skin:  Negative for rash.  Neurological:  Negative for dizziness, tremors, speech difficulty and weakness.  Psychiatric/Behavioral:  Negative for agitation, dysphoric mood, sleep disturbance and suicidal ideas. The patient is not nervous/anxious.     Objective:  BP (!) 104/54   Pulse 66   Temp 98.3 F (36.8 C)   Ht 5\' 11"  (1.803 m)   Wt 192 lb 3.2 oz (87.2 kg)   SpO2 96%   BMI 26.81 kg/m   BP Readings from Last 3 Encounters:  09/23/23 (!) 104/54  08/07/23 128/70  06/20/23 120/60    Wt Readings from Last 3 Encounters:  09/23/23 192 lb 3.2 oz (87.2 kg)  08/07/23 200 lb 3.2 oz (90.8 kg)  06/20/23 191 lb (86.6 kg)    Physical  Exam Constitutional:      General: He is not in acute distress.    Appearance: He is well-developed. He is obese.     Comments: NAD  Eyes:     Conjunctiva/sclera: Conjunctivae normal.     Pupils: Pupils are equal, round, and reactive to light.  Neck:     Thyroid: No thyromegaly.     Vascular: No JVD.  Cardiovascular:     Rate and Rhythm: Normal rate and regular rhythm.     Heart sounds: Normal heart sounds. No murmur heard.    No friction rub. No gallop.  Pulmonary:     Effort: Pulmonary effort is normal. No respiratory distress.     Breath sounds: Normal breath sounds. No wheezing or rales.  Chest:     Chest wall: No tenderness.  Abdominal:     General: Bowel sounds are normal. There is no distension.     Palpations: Abdomen is soft. There is no mass.     Tenderness: There is no abdominal tenderness. There is no guarding or rebound.  Genitourinary:    Prostate: Normal.     Rectum: Normal. Guaiac result negative.  Musculoskeletal:        General: No tenderness. Normal range of motion.     Cervical back: Normal range of motion.  Lymphadenopathy:     Cervical: No cervical adenopathy.  Skin:    General: Skin is warm and dry.     Findings: No rash.  Neurological:     Mental Status: He is alert and oriented to person, place, and time.     Cranial Nerves: No cranial nerve deficit.     Motor: No abnormal muscle tone.     Coordination: Coordination normal.     Gait: Gait normal.     Deep Tendon Reflexes: Reflexes are normal and symmetric.  Psychiatric:        Behavior: Behavior normal.        Thought Content: Thought content normal.        Judgment: Judgment normal.   No prostate nodules  Onychomycotic nails - some  Lab Results  Component Value Date   WBC 7.0 09/16/2023   HGB 14.9 09/16/2023   HCT 43.9 09/16/2023   PLT 210.0 09/16/2023   GLUCOSE 146 (H) 09/16/2023   CHOL 105 09/16/2023   TRIG 110.0 09/16/2023   HDL 38.10 (L) 09/16/2023   LDLDIRECT 71.0 03/05/2019    LDLCALC 45 09/16/2023   ALT 17 09/16/2023   AST 15 09/16/2023   NA 141 09/16/2023   K 4.0 09/16/2023   CL 107 09/16/2023   CREATININE 1.13 09/16/2023  BUN 15 09/16/2023   CO2 26 09/16/2023   TSH 1.54 09/16/2023   PSA 2.39 09/16/2023   INR 1.3 04/08/2008   HGBA1C 6.7 (H) 09/16/2023   MICROALBUR 18.8 (H) 09/16/2023    US RENAL Result Date: 12/09/2020 CLINICAL DATA:  Decreased GFR. EXAM: RENAL / URINARY TRACT ULTRASOUND COMPLETE COMPARISON:  None. FINDINGS: Right Kidney: Renal measurements: 12.0 x 5.3 x 5.1 cm = volume: 168 mL. Echogenicity within normal limits. No mass or hydronephrosis visualized. Left Kidney: Renal measurements: 12.3 x 5.5 x 4.8 cm = volume: 169 mL. Echogenicity within normal limits. No mass or hydronephrosis visualized. Bladder: Appears normal for degree of bladder distention. Bilateral ureteral jets are noted. Other: Mild prostatic enlargement is noted. IMPRESSION: Kidneys are unremarkable.  Mild prostatic enlargement. Electronically Signed   By: Lupita Raider M.D.   On: 12/09/2020 15:00    Assessment & Plan:   Problem List Items Addressed This Visit     Diabetes mellitus type 2 in obese   Cont on Glipizide, Metformin  Jardiance - increase to 25 mg/d       Relevant Medications   empagliflozin (JARDIANCE) 25 MG TABS tablet   Other Relevant Orders   Hemoglobin A1c   MICROALBUMINURIA   Worse: Jardiance - increase to 25 mg/d      Well adult exam - Primary    We discussed age appropriate health related issues, including available/recomended screening tests and vaccinations. Labs were ordered to be later reviewed . All questions were answered. We discussed one or more of the following - seat belt use, use of sunscreen/sun exposure exercise, safe sex, fall risk reduction, second hand smoke exposure, firearm use and storage, seat belt use, a need for adhering to healthy diet and exercise. Labs were ordered.  All questions were answered. Colon 2022, due in  2029 - Dr Marina Goodell       Relevant Orders   PSA   Comprehensive metabolic panel   Hemoglobin A1c   Decreased GFR    Hydrate well No NSAIDs      Relevant Orders   Comprehensive metabolic panel   Elevated PSA   Worse: repeat in 3 months      Relevant Orders   PSA   Onychomycosis   B feet Penlac      Relevant Medications   ciclopirox (PENLAC) 8 % solution      Meds ordered this encounter  Medications   empagliflozin (JARDIANCE) 25 MG TABS tablet    Sig: Take 1 tablet (25 mg total) by mouth daily.    Dispense:  90 tablet    Refill:  3   ciclopirox (PENLAC) 8 % solution    Sig: Apply topically at bedtime. Apply over nail and surrounding skin. Apply daily over previous coat. After seven (7) days, may remove with alcohol and continue cycle.    Dispense:  6.6 mL    Refill:  1      Follow-up: Return in about 3 months (around 12/24/2023) for a follow-up visit.  Sonda Primes, MD

## 2023-09-23 NOTE — Assessment & Plan Note (Signed)
  Hydrate well No NSAIDs

## 2023-09-23 NOTE — Assessment & Plan Note (Signed)
 Worse: Jardiance - increase to 25 mg/d

## 2023-10-20 ENCOUNTER — Other Ambulatory Visit: Payer: Self-pay | Admitting: Internal Medicine

## 2023-11-17 ENCOUNTER — Other Ambulatory Visit: Payer: Self-pay | Admitting: Internal Medicine

## 2023-11-22 ENCOUNTER — Other Ambulatory Visit: Payer: Self-pay | Admitting: Internal Medicine

## 2023-12-17 ENCOUNTER — Other Ambulatory Visit (INDEPENDENT_AMBULATORY_CARE_PROVIDER_SITE_OTHER)

## 2023-12-17 DIAGNOSIS — E1169 Type 2 diabetes mellitus with other specified complication: Secondary | ICD-10-CM | POA: Diagnosis not present

## 2023-12-17 DIAGNOSIS — Z0000 Encounter for general adult medical examination without abnormal findings: Secondary | ICD-10-CM | POA: Diagnosis not present

## 2023-12-17 DIAGNOSIS — E669 Obesity, unspecified: Secondary | ICD-10-CM | POA: Diagnosis not present

## 2023-12-17 DIAGNOSIS — R944 Abnormal results of kidney function studies: Secondary | ICD-10-CM | POA: Diagnosis not present

## 2023-12-17 DIAGNOSIS — R972 Elevated prostate specific antigen [PSA]: Secondary | ICD-10-CM

## 2023-12-17 LAB — COMPREHENSIVE METABOLIC PANEL WITH GFR
ALT: 16 U/L (ref 0–53)
AST: 15 U/L (ref 0–37)
Albumin: 4.5 g/dL (ref 3.5–5.2)
Alkaline Phosphatase: 45 U/L (ref 39–117)
BUN: 18 mg/dL (ref 6–23)
CO2: 28 meq/L (ref 19–32)
Calcium: 9.5 mg/dL (ref 8.4–10.5)
Chloride: 105 meq/L (ref 96–112)
Creatinine, Ser: 1.19 mg/dL (ref 0.40–1.50)
GFR: 60.76 mL/min (ref >60.00)
Glucose, Bld: 133 mg/dL — ABNORMAL HIGH (ref 70–99)
Potassium: 4.1 meq/L (ref 3.5–5.1)
Sodium: 142 meq/L (ref 135–145)
Total Bilirubin: 0.9 mg/dL (ref 0.2–1.2)
Total Protein: 6.5 g/dL (ref 6.0–8.3)

## 2023-12-17 LAB — HEMOGLOBIN A1C: Hgb A1c MFr Bld: 6.6 % — ABNORMAL HIGH (ref 4.6–6.5)

## 2023-12-17 LAB — PSA: PSA: 1.03 ng/mL (ref 0.10–4.00)

## 2023-12-20 ENCOUNTER — Ambulatory Visit: Payer: Self-pay | Admitting: Internal Medicine

## 2023-12-24 ENCOUNTER — Ambulatory Visit (INDEPENDENT_AMBULATORY_CARE_PROVIDER_SITE_OTHER): Admitting: Internal Medicine

## 2023-12-24 ENCOUNTER — Encounter: Payer: Self-pay | Admitting: Internal Medicine

## 2023-12-24 VITALS — BP 118/74 | HR 67 | Temp 98.4°F | Ht 71.0 in | Wt 192.0 lb

## 2023-12-24 DIAGNOSIS — E1169 Type 2 diabetes mellitus with other specified complication: Secondary | ICD-10-CM

## 2023-12-24 DIAGNOSIS — E669 Obesity, unspecified: Secondary | ICD-10-CM | POA: Diagnosis not present

## 2023-12-24 DIAGNOSIS — E782 Mixed hyperlipidemia: Secondary | ICD-10-CM

## 2023-12-24 DIAGNOSIS — E1159 Type 2 diabetes mellitus with other circulatory complications: Secondary | ICD-10-CM | POA: Diagnosis not present

## 2023-12-24 DIAGNOSIS — Z7984 Long term (current) use of oral hypoglycemic drugs: Secondary | ICD-10-CM | POA: Diagnosis not present

## 2023-12-24 DIAGNOSIS — I152 Hypertension secondary to endocrine disorders: Secondary | ICD-10-CM

## 2023-12-24 DIAGNOSIS — R944 Abnormal results of kidney function studies: Secondary | ICD-10-CM | POA: Diagnosis not present

## 2023-12-24 DIAGNOSIS — I2510 Atherosclerotic heart disease of native coronary artery without angina pectoris: Secondary | ICD-10-CM

## 2023-12-24 NOTE — Assessment & Plan Note (Signed)
Cont on Enalapril 

## 2023-12-24 NOTE — Assessment & Plan Note (Signed)
 Cont on Glipizide, Metformin  Jardiance - increase to 25 mg/d

## 2023-12-24 NOTE — Assessment & Plan Note (Signed)
 On Lipitor

## 2023-12-24 NOTE — Assessment & Plan Note (Signed)
No ischemia F/u w/Dr Anne Fu Cont on Lipitor, ASA

## 2023-12-24 NOTE — Progress Notes (Signed)
 Subjective:  Patient ID: Prentice SQUIBB Smyser Mickey., male    DOB: [birth year]  Age: 73 y.o. MRN: [id number]  CC: Medical Management of Chronic Issues (3 MNTH F/U/)   HPI Prentice SQUIBB [employer]. presents for DM, HTN, CAD f/u  Outpatient Medications Prior to Visit  Medication Sig Dispense Refill   ACCU-CHEK GUIDE test strip USE TO TEST BLOOD SUGAR FIVE TIMES A DAY 450 strip 1   aspirin  EC 81 MG tablet Take 1 tablet (81 mg total) by mouth daily. Swallow whole. 90 tablet 3   atorvastatin  (LIPITOR) 80 MG tablet TAKE 1 TABLET BY MOUTH EVERY DAY 90 tablet 2   Blood Glucose Monitoring Suppl (ACCU-CHEK GUIDE ME) w/Device KIT 1 Units by Does not apply route 5 (five) times daily. 1 kit 1   buPROPion  (WELLBUTRIN  XL) 150 MG 24 hr tablet TAKE 1 TABLET BY MOUTH EVERY DAY 90 tablet 1   Cholecalciferol  1000 UNITS tablet Take 1 tablet (1,000 Units total) by mouth daily. 200 tablet 3   ciclopirox  (PENLAC ) 8 % solution APPLY TOPICALLY AT BEDTIME. APPLY OVER NAIL AND SURROUNDING SKIN. APPLY DAILY OVER PREVIOUS COAT. AFTER SEVEN (7) DAYS, MAY REMOVE WITH ALCOHOL AND CONTINUE CYCLE. 6.6 mL 1   diphenhydrAMINE  (BENADRYL ) 25 MG tablet Take 1-2 tablets (25-50 mg total) by mouth every 8 (eight) hours as needed for allergies. 60 tablet 5   doxycycline  (VIBRA -TABS) 100 MG tablet TAKE 1 TABLET BY MOUTH TWICE A DAY 20 tablet 0   empagliflozin  (JARDIANCE ) 25 MG TABS tablet Take 1 tablet (25 mg total) by mouth daily. 90 tablet 3   enalapril  (VASOTEC ) 5 MG tablet TAKE 2 TABLETS BY MOUTH EVERY DAY 180 tablet 1   glipiZIDE  (GLUCOTROL  XL) 5 MG 24 hr tablet TAKE 2 TABLETS BY MOUTH IN AM OR ONE TWICE A DAY 180 tablet 2   ibuprofen  (ADVIL ) 600 MG tablet TAKE 1 TABLET (600 MG TOTAL) BY MOUTH EVERY 4 (FOUR) HOURS AS NEEDED. FOR PAIN 30 tablet 0   indomethacin  (INDOCIN  SR) 75 MG CR capsule TAKE 1 CAPSULE BY MOUTH TWICE A DAY WITH MEAL 60 capsule 1   Lancets (ONETOUCH DELICA PLUS LANCET33G) MISC USE TO HELP CHECK BLOOD SUGARS 5 TIMES A DAY 500  each 3   meclizine  (ANTIVERT ) 12.5 MG tablet TAKE 1 TABLET BY MOUTH THREE TIMES A DAY AS NEEDED 90 tablet 1   metFORMIN  (GLUCOPHAGE ) 1000 MG tablet TAKE 1 TABLET BY MOUTH TWICE A DAY WITH FOOD 180 tablet 2   niacin  (VITAMIN B3) 500 MG ER tablet TAKE 1 TABLET BY MOUTH EVERYDAY AT BEDTIME 90 tablet 3   OMEGA 3-6-9 FATTY ACIDS PO Take 1 tablet by mouth daily.     propranolol  (INDERAL ) 10 MG tablet TAKE 1 TO 2 TABLETS BY MOUTH EVERY DAY AS NEEDED 180 tablet 1   silodosin  (RAPAFLO ) 8 MG CAPS capsule TAKE 1 CAPSULE BY MOUTH EVERY DAY 90 capsule 2   tadalafil  (CIALIS ) 5 MG tablet TAKE ONE TABLET DAILY FOR BPH 90 tablet 2   valACYclovir  (VALTREX ) 500 MG tablet TAKE 1 TABLET BY MOUTH THREE TIMES A DAY 21 tablet 2   No facility-administered medications prior to visit.    ROS: Review of Systems  Constitutional:  Negative for appetite change, fatigue and unexpected weight change.  HENT:  Negative for congestion, nosebleeds, sneezing, sore throat and trouble swallowing.   Eyes:  Negative for itching and visual disturbance.  Respiratory:  Negative for cough.   Cardiovascular:  Negative for chest pain,  palpitations and leg swelling.  Gastrointestinal:  Negative for abdominal distention, blood in stool, diarrhea and nausea.  Genitourinary:  Negative for frequency and hematuria.  Musculoskeletal:  Negative for back pain, gait problem, joint swelling and neck pain.  Skin:  Negative for rash.  Neurological:  Negative for dizziness, tremors, speech difficulty and weakness.  Psychiatric/Behavioral:  Negative for agitation, dysphoric mood and sleep disturbance. The patient is not nervous/anxious.     Objective:  BP 118/74   Pulse 67   Temp 98.4 F (36.9 C) (Oral)   Ht 5' 11 (1.803 m)   Wt 192 lb (87.1 kg)   SpO2 96%   BMI 26.78 kg/m   BP Readings from Last 3 Encounters:  12/24/23 118/74  09/23/23 (!) 104/54  08/07/23 128/70    Wt Readings from Last 3 Encounters:  12/24/23 192 lb (87.1 kg)   09/23/23 192 lb 3.2 oz (87.2 kg)  08/07/23 200 lb 3.2 oz (90.8 kg)    Physical Exam Constitutional:      General: He is not in acute distress.    Appearance: He is well-developed.     Comments: NAD   Eyes:     Conjunctiva/sclera: Conjunctivae normal.     Pupils: Pupils are equal, round, and reactive to light.   Neck:     Thyroid : No thyromegaly.     Vascular: No JVD.   Cardiovascular:     Rate and Rhythm: Normal rate and regular rhythm.     Heart sounds: Normal heart sounds. No murmur heard.    No friction rub. No gallop.  Pulmonary:     Effort: Pulmonary effort is normal. No respiratory distress.     Breath sounds: Normal breath sounds. No wheezing or rales.  Chest:     Chest wall: No tenderness.  Abdominal:     General: Bowel sounds are normal. There is no distension.     Palpations: Abdomen is soft. There is no mass.     Tenderness: There is no abdominal tenderness. There is no guarding or rebound.   Musculoskeletal:        General: No tenderness. Normal range of motion.     Cervical back: Normal range of motion.     Right lower leg: No edema.     Left lower leg: No edema.  Lymphadenopathy:     Cervical: No cervical adenopathy.   Skin:    General: Skin is warm and dry.     Findings: No rash.   Neurological:     Mental Status: He is alert and oriented to person, place, and time.     Cranial Nerves: No cranial nerve deficit.     Motor: No abnormal muscle tone.     Coordination: Coordination normal.     Gait: Gait normal.     Deep Tendon Reflexes: Reflexes are normal and symmetric.   Psychiatric:        Behavior: Behavior normal.        Thought Content: Thought content normal.        Judgment: Judgment normal.     Lab Results  Component Value Date   WBC 7.0 09/16/2023   HGB 14.9 09/16/2023   HCT 43.9 09/16/2023   PLT 210.0 09/16/2023   GLUCOSE 133 (H) 12/17/2023   CHOL 105 09/16/2023   TRIG 110.0 09/16/2023   HDL 38.10 (L) 09/16/2023   LDLDIRECT  71.0 03/05/2019   LDLCALC 45 09/16/2023   ALT 16 12/17/2023   AST 15 12/17/2023   NA  142 12/17/2023   K 4.1 12/17/2023   CL 105 12/17/2023   CREATININE 1.19 12/17/2023   BUN 18 12/17/2023   CO2 28 12/17/2023   TSH 1.54 09/16/2023   PSA 1.03 12/17/2023   INR 1.3 04/08/2008   HGBA1C 6.6 (H) 12/17/2023   MICROALBUR 18.8 (H) 09/16/2023    US  RENAL Result Date: 12/09/2020 CLINICAL DATA:  Decreased GFR. EXAM: RENAL / URINARY TRACT ULTRASOUND COMPLETE COMPARISON:  None. FINDINGS: Right Kidney: Renal measurements: 12.0 x 5.3 x 5.1 cm = volume: 168 mL. Echogenicity within normal limits. No mass or hydronephrosis visualized. Left Kidney: Renal measurements: 12.3 x 5.5 x 4.8 cm = volume: 169 mL. Echogenicity within normal limits. No mass or hydronephrosis visualized. Bladder: Appears normal for degree of bladder distention. Bilateral ureteral jets are noted. Other: Mild prostatic enlargement is noted. IMPRESSION: Kidneys are unremarkable.  Mild prostatic enlargement. Electronically Signed   By: Lynwood Landy Raddle M.D.   On: 12/09/2020 15:00    Assessment & Plan:   Problem List Items Addressed This Visit     Diabetes mellitus type 2 in obese - Primary   Cont on Glipizide , Metformin   Jardiance  - increase to 25 mg/d       Relevant Orders   Comprehensive metabolic panel with GFR   Hemoglobin A1c   Mixed hyperlipidemia   On Lipitor      CAD (coronary artery disease)   No ischemia F/u w/Dr Jeffrie Cont on Lipitor, ASA      Hypertension associated with diabetes (HCC)   Cont on Enalapril       Decreased GFR    Hydrate well No NSAIDs         No orders of the defined types were placed in this encounter.     Follow-up: Return in about 3 months (around 03/25/2024) for a follow-up visit.  Marolyn Noel, MD

## 2023-12-24 NOTE — Assessment & Plan Note (Signed)
 Low carb diet

## 2023-12-24 NOTE — Assessment & Plan Note (Signed)
  Hydrate well No NSAIDs

## 2024-01-01 ENCOUNTER — Other Ambulatory Visit: Payer: Self-pay | Admitting: Internal Medicine

## 2024-01-28 ENCOUNTER — Other Ambulatory Visit: Payer: Self-pay | Admitting: Internal Medicine

## 2024-03-19 ENCOUNTER — Other Ambulatory Visit (INDEPENDENT_AMBULATORY_CARE_PROVIDER_SITE_OTHER)

## 2024-03-19 DIAGNOSIS — E1169 Type 2 diabetes mellitus with other specified complication: Secondary | ICD-10-CM

## 2024-03-19 DIAGNOSIS — E669 Obesity, unspecified: Secondary | ICD-10-CM

## 2024-03-19 LAB — COMPREHENSIVE METABOLIC PANEL WITH GFR
ALT: 17 U/L (ref 0–53)
AST: 18 U/L (ref 0–37)
Albumin: 4.2 g/dL (ref 3.5–5.2)
Alkaline Phosphatase: 46 U/L (ref 39–117)
BUN: 18 mg/dL (ref 6–23)
CO2: 29 meq/L (ref 19–32)
Calcium: 9.4 mg/dL (ref 8.4–10.5)
Chloride: 104 meq/L (ref 96–112)
Creatinine, Ser: 1.18 mg/dL (ref 0.40–1.50)
GFR: 61.26 mL/min (ref >60.00)
Glucose, Bld: 129 mg/dL — ABNORMAL HIGH (ref 70–99)
Potassium: 3.9 meq/L (ref 3.5–5.1)
Sodium: 141 meq/L (ref 135–145)
Total Bilirubin: 1.2 mg/dL (ref 0.2–1.2)
Total Protein: 6.4 g/dL (ref 6.0–8.3)

## 2024-03-19 LAB — HEMOGLOBIN A1C: Hgb A1c MFr Bld: 7 % — ABNORMAL HIGH (ref 4.6–6.5)

## 2024-03-23 ENCOUNTER — Ambulatory Visit: Payer: Self-pay | Admitting: Internal Medicine

## 2024-03-25 ENCOUNTER — Encounter: Payer: Self-pay | Admitting: Internal Medicine

## 2024-03-25 ENCOUNTER — Ambulatory Visit (INDEPENDENT_AMBULATORY_CARE_PROVIDER_SITE_OTHER): Admitting: Internal Medicine

## 2024-03-25 VITALS — BP 138/76 | HR 54 | Temp 98.7°F | Ht 71.0 in | Wt 195.2 lb

## 2024-03-25 DIAGNOSIS — D485 Neoplasm of uncertain behavior of skin: Secondary | ICD-10-CM | POA: Diagnosis not present

## 2024-03-25 DIAGNOSIS — R944 Abnormal results of kidney function studies: Secondary | ICD-10-CM

## 2024-03-25 DIAGNOSIS — E669 Obesity, unspecified: Secondary | ICD-10-CM

## 2024-03-25 DIAGNOSIS — E1169 Type 2 diabetes mellitus with other specified complication: Secondary | ICD-10-CM

## 2024-03-25 DIAGNOSIS — I152 Hypertension secondary to endocrine disorders: Secondary | ICD-10-CM | POA: Diagnosis not present

## 2024-03-25 DIAGNOSIS — I2510 Atherosclerotic heart disease of native coronary artery without angina pectoris: Secondary | ICD-10-CM | POA: Diagnosis not present

## 2024-03-25 DIAGNOSIS — E663 Overweight: Secondary | ICD-10-CM

## 2024-03-25 DIAGNOSIS — Z23 Encounter for immunization: Secondary | ICD-10-CM | POA: Diagnosis not present

## 2024-03-25 DIAGNOSIS — E1159 Type 2 diabetes mellitus with other circulatory complications: Secondary | ICD-10-CM

## 2024-03-25 NOTE — Assessment & Plan Note (Addendum)
 Cont on Glipizide , Metformin   Jardiance  - increase to 25 mg/d Worse a little. Adding Mounjaro vs Ozempic was discussed - not interested at the moment

## 2024-03-25 NOTE — Assessment & Plan Note (Signed)
  Hydrate well No NSAIDs

## 2024-03-25 NOTE — Assessment & Plan Note (Signed)
  Adding Mounjaro vs Ozempic was discussed - not interested at the moment

## 2024-03-25 NOTE — Patient Instructions (Signed)
 Mounjaro vs Ozempic

## 2024-03-25 NOTE — Progress Notes (Signed)
 Subjective:  Patient ID: [name] [name]., male    DOB: 1951-01-01  Age: 73 y.o. MRN: [id number]  CC: Medical Management of Chronic Issues (3 Month follow up)   HPI [name] SQUIBB [employer]. presents for DM, HTN, CAD  Outpatient Medications Prior to Visit  Medication Sig Dispense Refill   aspirin  EC 81 MG tablet Take 1 tablet (81 mg total) by mouth daily. Swallow whole. 90 tablet 3   atorvastatin  (LIPITOR) 80 MG tablet TAKE 1 TABLET BY MOUTH EVERY DAY 90 tablet 2   atovaquone -proguanil (MALARONE ) 250-100 MG TABS tablet TAKE AS DIRECTED 60 tablet 1   Blood Glucose Monitoring Suppl (ACCU-CHEK GUIDE ME) w/Device KIT 1 Units by Does not apply route 5 (five) times daily. 1 kit 1   buPROPion  (WELLBUTRIN  XL) 150 MG 24 hr tablet TAKE 1 TABLET BY MOUTH EVERY DAY 90 tablet 1   Cholecalciferol  1000 UNITS tablet Take 1 tablet (1,000 Units total) by mouth daily. 200 tablet 3   ciclopirox  (PENLAC ) 8 % solution APPLY TOPICALLY AT BEDTIME. APPLY OVER NAIL AND SURROUNDING SKIN. APPLY DAILY OVER PREVIOUS COAT. AFTER SEVEN (7) DAYS, MAY REMOVE WITH ALCOHOL AND CONTINUE CYCLE. 6.6 mL 1   diphenhydrAMINE  (BENADRYL ) 25 MG tablet Take 1-2 tablets (25-50 mg total) by mouth every 8 (eight) hours as needed for allergies. 60 tablet 5   empagliflozin  (JARDIANCE ) 25 MG TABS tablet Take 1 tablet (25 mg total) by mouth daily. 90 tablet 3   enalapril  (VASOTEC ) 5 MG tablet TAKE 2 TABLETS BY MOUTH EVERY DAY 180 tablet 1   glipiZIDE  (GLUCOTROL  XL) 5 MG 24 hr tablet TAKE 2 TABLETS BY MOUTH IN AM OR ONE TWICE A DAY 180 tablet 2   ibuprofen  (ADVIL ) 600 MG tablet TAKE 1 TABLET (600 MG TOTAL) BY MOUTH EVERY 4 (FOUR) HOURS AS NEEDED. FOR PAIN 30 tablet 0   indomethacin  (INDOCIN  SR) 75 MG CR capsule TAKE 1 CAPSULE BY MOUTH TWICE A DAY WITH MEAL 60 capsule 1   Lancets (ONETOUCH DELICA PLUS LANCET33G) MISC USE TO HELP CHECK BLOOD SUGARS 5 TIMES A DAY 500 each 3   meclizine  (ANTIVERT ) 12.5 MG tablet TAKE 1 TABLET BY MOUTH THREE TIMES A  DAY AS NEEDED 90 tablet 1   metFORMIN  (GLUCOPHAGE ) 1000 MG tablet TAKE 1 TABLET BY MOUTH TWICE A DAY WITH FOOD 180 tablet 2   niacin  (VITAMIN B3) 500 MG ER tablet TAKE 1 TABLET BY MOUTH EVERYDAY AT BEDTIME 90 tablet 3   OMEGA 3-6-9 FATTY ACIDS PO Take 1 tablet by mouth daily.     propranolol  (INDERAL ) 10 MG tablet TAKE 1 TO 2 TABLETS BY MOUTH EVERY DAY AS NEEDED 180 tablet 1   silodosin  (RAPAFLO ) 8 MG CAPS capsule TAKE 1 CAPSULE BY MOUTH EVERY DAY 90 capsule 2   tadalafil  (CIALIS ) 5 MG tablet TAKE ONE TABLET DAILY FOR BPH 90 tablet 2   ACCU-CHEK GUIDE test strip USE TO TEST BLOOD SUGAR FIVE TIMES A DAY 450 strip 1   doxycycline  (VIBRA -TABS) 100 MG tablet TAKE 1 TABLET BY MOUTH TWICE A DAY 20 tablet 0   valACYclovir  (VALTREX ) 500 MG tablet TAKE 1 TABLET BY MOUTH THREE TIMES A DAY 21 tablet 2   No facility-administered medications prior to visit.    ROS: Review of Systems  Constitutional:  Negative for appetite change, fatigue and unexpected weight change.  HENT:  Negative for congestion, nosebleeds, sneezing, sore throat and trouble swallowing.   Eyes:  Negative for itching and visual disturbance.  Respiratory:  Negative for cough.   Cardiovascular:  Negative for chest pain, palpitations and leg swelling.  Gastrointestinal:  Negative for abdominal distention, blood in stool, diarrhea and nausea.  Genitourinary:  Negative for frequency and hematuria.  Musculoskeletal:  Negative for back pain, gait problem, joint swelling and neck pain.  Skin:  Negative for rash.  Neurological:  Negative for dizziness, tremors, speech difficulty and weakness.  Psychiatric/Behavioral:  Negative for agitation, dysphoric mood and sleep disturbance. The patient is not nervous/anxious.     Objective:  BP 138/76   Pulse (!) 54   Temp 98.7 F (37.1 C)   Ht 5' 11 (1.803 m)   Wt 195 lb 3.2 oz (88.5 kg)   SpO2 96%   BMI 27.22 kg/m   BP Readings from Last 3 Encounters:  03/25/24 138/76  12/24/23 118/74   09/23/23 (!) 104/54    Wt Readings from Last 3 Encounters:  03/25/24 195 lb 3.2 oz (88.5 kg)  12/24/23 192 lb (87.1 kg)  09/23/23 192 lb 3.2 oz (87.2 kg)    Physical Exam Constitutional:      General: He is not in acute distress.    Appearance: He is well-developed.     Comments: NAD  Eyes:     Conjunctiva/sclera: Conjunctivae normal.     Pupils: Pupils are equal, round, and reactive to light.  Neck:     Thyroid : No thyromegaly.     Vascular: No JVD.  Cardiovascular:     Rate and Rhythm: Normal rate and regular rhythm.     Heart sounds: Normal heart sounds. No murmur heard.    No friction rub. No gallop.  Pulmonary:     Effort: Pulmonary effort is normal. No respiratory distress.     Breath sounds: Normal breath sounds. No wheezing or rales.  Chest:     Chest wall: No tenderness.  Abdominal:     General: Bowel sounds are normal. There is no distension.     Palpations: Abdomen is soft. There is no mass.     Tenderness: There is no abdominal tenderness. There is no guarding or rebound.  Musculoskeletal:        General: No tenderness. Normal range of motion.     Cervical back: Normal range of motion.  Lymphadenopathy:     Cervical: No cervical adenopathy.  Skin:    General: Skin is warm and dry.     Findings: No rash.  Neurological:     Mental Status: He is alert and oriented to person, place, and time.     Cranial Nerves: No cranial nerve deficit.     Motor: No abnormal muscle tone.     Coordination: Coordination normal.     Gait: Gait normal.     Deep Tendon Reflexes: Reflexes are normal and symmetric.  Psychiatric:        Behavior: Behavior normal.        Thought Content: Thought content normal.        Judgment: Judgment normal.   A 2 mm ?wart L nose saddle  Lab Results  Component Value Date   WBC 7.0 09/16/2023   HGB 14.9 09/16/2023   HCT 43.9 09/16/2023   PLT 210.0 09/16/2023   GLUCOSE 129 (H) 03/19/2024   CHOL 105 09/16/2023   TRIG 110.0 09/16/2023    HDL 38.10 (L) 09/16/2023   LDLDIRECT 71.0 03/05/2019   LDLCALC 45 09/16/2023   ALT 17 03/19/2024   AST 18 03/19/2024   NA 141 03/19/2024  K 3.9 03/19/2024   CL 104 03/19/2024   CREATININE 1.18 03/19/2024   BUN 18 03/19/2024   CO2 29 03/19/2024   TSH 1.54 09/16/2023   PSA 1.03 12/17/2023   INR 1.3 04/08/2008   HGBA1C 7.0 (H) 03/19/2024   MICROALBUR 18.8 (H) 09/16/2023    US  RENAL Result Date: 12/09/2020 CLINICAL DATA:  Decreased GFR. EXAM: RENAL / URINARY TRACT ULTRASOUND COMPLETE COMPARISON:  None. FINDINGS: Right Kidney: Renal measurements: 12.0 x 5.3 x 5.1 cm = volume: 168 mL. Echogenicity within normal limits. No mass or hydronephrosis visualized. Left Kidney: Renal measurements: 12.3 x 5.5 x 4.8 cm = volume: 169 mL. Echogenicity within normal limits. No mass or hydronephrosis visualized. Bladder: Appears normal for degree of bladder distention. Bilateral ureteral jets are noted. Other: Mild prostatic enlargement is noted. IMPRESSION: Kidneys are unremarkable.  Mild prostatic enlargement. Electronically Signed   By: Lynwood Landy Raddle M.D.   On: 12/09/2020 15:00    Assessment & Plan:   Problem List Items Addressed This Visit     CAD (coronary artery disease)   No ischemia F/u w/Dr Jeffrie Cont on Lipitor, ASA      Decreased GFR    Hydrate well No NSAIDs      Diabetes mellitus type 2 in obese - Primary   Cont on Glipizide , Metformin   Jardiance  - increase to 25 mg/d Worse a little. Adding Mounjaro vs Ozempic was discussed - not interested at the moment       Hypertension associated with diabetes (HCC)    Adding Mounjaro vs Ozempic was discussed - not interested at the moment       Neoplasm of uncertain behavior of skin   A 2 mm ?wart L nose saddle Cryo or der appt offered      Overweight    Adding Mounjaro vs Ozempic was discussed - not interested at the moment       Other Visit Diagnoses       Immunization due       Relevant Orders   Flu vaccine  HIGH DOSE PF(Fluzone Trivalent) (Completed)         No orders of the defined types were placed in this encounter.     Follow-up: Return in about 3 months (around 06/24/2024) for a follow-up visit.  Marolyn Noel, MD

## 2024-03-25 NOTE — Assessment & Plan Note (Addendum)
 A 2 mm ?wart L nose saddle Cryo or der appt offered

## 2024-03-25 NOTE — Assessment & Plan Note (Signed)
No ischemia F/u w/Dr Anne Fu Cont on Lipitor, ASA

## 2024-03-28 ENCOUNTER — Other Ambulatory Visit: Payer: Self-pay | Admitting: Internal Medicine

## 2024-03-29 ENCOUNTER — Other Ambulatory Visit: Payer: Self-pay | Admitting: Internal Medicine

## 2024-03-30 ENCOUNTER — Other Ambulatory Visit: Payer: Self-pay | Admitting: Internal Medicine

## 2024-04-08 LAB — OPHTHALMOLOGY REPORT-SCANNED

## 2024-05-26 ENCOUNTER — Ambulatory Visit (HOSPITAL_COMMUNITY)
Admission: RE | Admit: 2024-05-26 | Discharge: 2024-05-26 | Disposition: A | Discharge status: Home / Self Care | Source: Ambulatory Visit | Attending: Cardiology | Admitting: Cardiology

## 2024-05-26 DIAGNOSIS — I351 Nonrheumatic aortic (valve) insufficiency: Secondary | ICD-10-CM | POA: Insufficient documentation

## 2024-05-26 DIAGNOSIS — I77810 Thoracic aortic ectasia: Secondary | ICD-10-CM | POA: Diagnosis present

## 2024-05-26 LAB — ECHOCARDIOGRAM COMPLETE
Area-P 1/2: 3.65 cm2
P 1/2 time: 488 ms
S' Lateral: 2.9 cm

## 2024-05-27 ENCOUNTER — Ambulatory Visit: Payer: Self-pay | Admitting: Cardiology

## 2024-05-27 DIAGNOSIS — I351 Nonrheumatic aortic (valve) insufficiency: Secondary | ICD-10-CM

## 2024-05-27 DIAGNOSIS — I77810 Thoracic aortic ectasia: Secondary | ICD-10-CM

## 2024-05-27 DIAGNOSIS — I340 Nonrheumatic mitral (valve) insufficiency: Secondary | ICD-10-CM

## 2024-06-14 ENCOUNTER — Other Ambulatory Visit: Payer: Self-pay | Admitting: Family

## 2024-06-14 ENCOUNTER — Other Ambulatory Visit: Payer: Self-pay | Admitting: Internal Medicine

## 2024-06-30 ENCOUNTER — Other Ambulatory Visit (INDEPENDENT_AMBULATORY_CARE_PROVIDER_SITE_OTHER)

## 2024-06-30 ENCOUNTER — Other Ambulatory Visit: Payer: Self-pay

## 2024-06-30 DIAGNOSIS — I152 Hypertension secondary to endocrine disorders: Secondary | ICD-10-CM | POA: Diagnosis not present

## 2024-06-30 DIAGNOSIS — E782 Mixed hyperlipidemia: Secondary | ICD-10-CM

## 2024-06-30 DIAGNOSIS — I2510 Atherosclerotic heart disease of native coronary artery without angina pectoris: Secondary | ICD-10-CM

## 2024-06-30 DIAGNOSIS — E1159 Type 2 diabetes mellitus with other circulatory complications: Secondary | ICD-10-CM

## 2024-06-30 LAB — COMPREHENSIVE METABOLIC PANEL WITH GFR
ALT: 19 U/L (ref 3–53)
AST: 18 U/L (ref 5–37)
Albumin: 4.1 g/dL (ref 3.5–5.2)
Alkaline Phosphatase: 52 U/L (ref 39–117)
BUN: 20 mg/dL (ref 6–23)
CO2: 31 meq/L (ref 19–32)
Calcium: 9.5 mg/dL (ref 8.4–10.5)
Chloride: 102 meq/L (ref 96–112)
Creatinine, Ser: 1.15 mg/dL (ref 0.40–1.50)
GFR: 63.06 mL/min
Glucose, Bld: 147 mg/dL — ABNORMAL HIGH (ref 70–99)
Potassium: 3.8 meq/L (ref 3.5–5.1)
Sodium: 141 meq/L (ref 135–145)
Total Bilirubin: 0.8 mg/dL (ref 0.2–1.2)
Total Protein: 6.5 g/dL (ref 6.0–8.3)

## 2024-06-30 LAB — HEMOGLOBIN A1C: Hgb A1c MFr Bld: 6.7 % — ABNORMAL HIGH (ref 4.6–6.5)

## 2024-07-06 ENCOUNTER — Ambulatory Visit (INDEPENDENT_AMBULATORY_CARE_PROVIDER_SITE_OTHER): Admitting: Internal Medicine

## 2024-07-06 ENCOUNTER — Encounter: Payer: Self-pay | Admitting: Internal Medicine

## 2024-07-06 VITALS — BP 144/68 | HR 66 | Ht 71.0 in | Wt 194.8 lb

## 2024-07-06 DIAGNOSIS — Z7984 Long term (current) use of oral hypoglycemic drugs: Secondary | ICD-10-CM | POA: Diagnosis not present

## 2024-07-06 DIAGNOSIS — I152 Hypertension secondary to endocrine disorders: Secondary | ICD-10-CM

## 2024-07-06 DIAGNOSIS — Z0000 Encounter for general adult medical examination without abnormal findings: Secondary | ICD-10-CM

## 2024-07-06 DIAGNOSIS — I2510 Atherosclerotic heart disease of native coronary artery without angina pectoris: Secondary | ICD-10-CM

## 2024-07-06 DIAGNOSIS — J019 Acute sinusitis, unspecified: Secondary | ICD-10-CM | POA: Insufficient documentation

## 2024-07-06 DIAGNOSIS — J0100 Acute maxillary sinusitis, unspecified: Secondary | ICD-10-CM | POA: Diagnosis not present

## 2024-07-06 DIAGNOSIS — Z951 Presence of aortocoronary bypass graft: Secondary | ICD-10-CM | POA: Diagnosis not present

## 2024-07-06 DIAGNOSIS — E669 Obesity, unspecified: Secondary | ICD-10-CM | POA: Diagnosis not present

## 2024-07-06 DIAGNOSIS — E1159 Type 2 diabetes mellitus with other circulatory complications: Secondary | ICD-10-CM

## 2024-07-06 DIAGNOSIS — E782 Mixed hyperlipidemia: Secondary | ICD-10-CM

## 2024-07-06 DIAGNOSIS — R972 Elevated prostate specific antigen [PSA]: Secondary | ICD-10-CM | POA: Diagnosis not present

## 2024-07-06 MED ORDER — CEFDINIR 300 MG PO CAPS: 300.0000 mg | ORAL_CAPSULE | Freq: Two times a day (BID) | ORAL | 0 refills | Status: AC

## 2024-07-06 NOTE — Patient Instructions (Signed)

## 2024-07-06 NOTE — Assessment & Plan Note (Addendum)
 Not better Omnicef  prn

## 2024-07-06 NOTE — Assessment & Plan Note (Signed)
 On Lipitor

## 2024-07-06 NOTE — Assessment & Plan Note (Signed)
 Cont on Glipizide , Metformin   Jardiance  - increase to 25 mg/d Worse a little. Adding Mounjaro vs Ozempic was discussed - not interested at the moment

## 2024-07-06 NOTE — Progress Notes (Addendum)
 "  Subjective:  Patient ID: [name] SQUIBB Leonhard Mickey., male    DOB: 1950-09-25  Age: 74 y.o. MRN: [id number]  CC: Medical Management of Chronic Issues (3 Month follow up)   HPI [name] [name]. presents for DM, HTN, dyslipidemia C/o URI sx's >2 wks  Outpatient Medications Prior to Visit  Medication Sig Dispense Refill   ACCU-CHEK GUIDE TEST test strip USE TO TEST BLOOD SUGAR FIVE TIMES A DAY 450 strip 3   aspirin  EC 81 MG tablet Take 1 tablet (81 mg total) by mouth daily. Swallow whole. 90 tablet 3   atorvastatin  (LIPITOR) 80 MG tablet TAKE 1 TABLET BY MOUTH EVERY DAY 90 tablet 2   atovaquone -proguanil (MALARONE ) 250-100 MG TABS tablet TAKE AS DIRECTED 60 tablet 1   Blood Glucose Monitoring Suppl (ACCU-CHEK GUIDE ME) w/Device KIT 1 Units by Does not apply route 5 (five) times daily. 1 kit 1   buPROPion  (WELLBUTRIN  XL) 150 MG 24 hr tablet TAKE 1 TABLET BY MOUTH EVERY DAY 90 tablet 1   Cholecalciferol  1000 UNITS tablet Take 1 tablet (1,000 Units total) by mouth daily. 200 tablet 3   ciclopirox  (PENLAC ) 8 % solution APPLY TOPICALLY AT BEDTIME. APPLY OVER NAIL AND SURROUNDING SKIN. APPLY DAILY OVER PREVIOUS COAT. AFTER SEVEN (7) DAYS, MAY REMOVE WITH ALCOHOL AND CONTINUE CYCLE. 6.6 mL 1   diphenhydrAMINE  (BENADRYL ) 25 MG tablet Take 1-2 tablets (25-50 mg total) by mouth every 8 (eight) hours as needed for allergies. 60 tablet 5   doxycycline  (VIBRA -TABS) 100 MG tablet TAKE 1 TABLET BY MOUTH TWICE A DAY 20 tablet 0   empagliflozin  (JARDIANCE ) 25 MG TABS tablet Take 1 tablet (25 mg total) by mouth daily. 90 tablet 3   enalapril  (VASOTEC ) 5 MG tablet TAKE 2 TABLETS BY MOUTH EVERY DAY 180 tablet 1   glipiZIDE  (GLUCOTROL  XL) 5 MG 24 hr tablet TAKE 2 TABLETS BY MOUTH IN AM OR ONE TWICE A DAY 180 tablet 2   ibuprofen  (ADVIL ) 600 MG tablet TAKE 1 TABLET (600 MG TOTAL) BY MOUTH EVERY 4 (FOUR) HOURS AS NEEDED. FOR PAIN 30 tablet 0   indomethacin  (INDOCIN  SR) 75 MG CR capsule TAKE 1 CAPSULE BY MOUTH TWICE A  DAY WITH MEAL 60 capsule 1   Lancets (ONETOUCH DELICA PLUS LANCET33G) MISC USE TO HELP CHECK BLOOD SUGARS 5 TIMES A DAY 500 each 3   meclizine  (ANTIVERT ) 12.5 MG tablet TAKE 1 TABLET BY MOUTH THREE TIMES A DAY AS NEEDED 90 tablet 1   metFORMIN  (GLUCOPHAGE ) 1000 MG tablet TAKE 1 TABLET BY MOUTH TWICE A DAY WITH FOOD 180 tablet 2   niacin  (VITAMIN B3) 500 MG ER tablet TAKE 1 TABLET BY MOUTH EVERYDAY AT BEDTIME 90 tablet 3   OMEGA 3-6-9 FATTY ACIDS PO Take 1 tablet by mouth daily.     propranolol  (INDERAL ) 10 MG tablet TAKE 1 TO 2 TABLETS BY MOUTH EVERY DAY AS NEEDED 180 tablet 1   silodosin  (RAPAFLO ) 8 MG CAPS capsule TAKE 1 CAPSULE BY MOUTH EVERY DAY 90 capsule 2   tadalafil  (CIALIS ) 5 MG tablet TAKE ONE TABLET DAILY FOR BPH 90 tablet 2   valACYclovir  (VALTREX ) 500 MG tablet TAKE 1 TABLET BY MOUTH THREE TIMES A DAY 21 tablet 2   No facility-administered medications prior to visit.    ROS: Review of Systems  Constitutional:  Negative for appetite change, fatigue and unexpected weight change.  HENT:  Positive for congestion, rhinorrhea and sinus pressure. Negative for nosebleeds, sneezing, sore throat  and trouble swallowing.   Eyes:  Negative for itching and visual disturbance.  Respiratory:  Negative for cough.   Cardiovascular:  Negative for chest pain, palpitations and leg swelling.  Gastrointestinal:  Negative for abdominal distention, blood in stool, diarrhea and nausea.  Genitourinary:  Negative for frequency and hematuria.  Musculoskeletal:  Negative for back pain, gait problem, joint swelling and neck pain.  Skin:  Negative for rash.  Neurological:  Negative for dizziness, tremors, speech difficulty and weakness.  Psychiatric/Behavioral:  Negative for agitation, dysphoric mood and sleep disturbance. The patient is not nervous/anxious.     Objective:  BP (!) 144/68   Pulse 66   Ht 5' 11 (1.803 m)   Wt 194 lb 12.8 oz (88.4 kg)   SpO2 96%   BMI 27.17 kg/m   BP Readings from  Last 3 Encounters:  07/06/24 (!) 144/68  03/25/24 138/76  12/24/23 118/74    Wt Readings from Last 3 Encounters:  07/06/24 194 lb 12.8 oz (88.4 kg)  03/25/24 195 lb 3.2 oz (88.5 kg)  12/24/23 192 lb (87.1 kg)    Physical Exam Constitutional:      General: He is not in acute distress.    Appearance: Normal appearance. He is well-developed.     Comments: NAD  Eyes:     Conjunctiva/sclera: Conjunctivae normal.     Pupils: Pupils are equal, round, and reactive to light.  Neck:     Thyroid : No thyromegaly.     Vascular: No JVD.  Cardiovascular:     Rate and Rhythm: Normal rate and regular rhythm.     Heart sounds: Normal heart sounds. No murmur heard.    No friction rub. No gallop.  Pulmonary:     Effort: Pulmonary effort is normal. No respiratory distress.     Breath sounds: Normal breath sounds. No wheezing or rales.  Chest:     Chest wall: No tenderness.  Abdominal:     General: Bowel sounds are normal. There is no distension.     Palpations: Abdomen is soft. There is no mass.     Tenderness: There is no abdominal tenderness. There is no guarding or rebound.  Musculoskeletal:        General: No tenderness. Normal range of motion.     Cervical back: Normal range of motion.  Lymphadenopathy:     Cervical: No cervical adenopathy.  Skin:    General: Skin is warm and dry.     Findings: No rash.  Neurological:     Mental Status: He is alert and oriented to person, place, and time.     Cranial Nerves: No cranial nerve deficit.     Motor: No abnormal muscle tone.     Coordination: Coordination normal.     Gait: Gait normal.     Deep Tendon Reflexes: Reflexes are normal and symmetric.  Psychiatric:        Behavior: Behavior normal.        Thought Content: Thought content normal.        Judgment: Judgment normal.     Lab Results  Component Value Date   WBC 7.0 09/16/2023   HGB 14.9 09/16/2023   HCT 43.9 09/16/2023   PLT 210.0 09/16/2023   GLUCOSE 147 (H) 06/30/2024    CHOL 105 09/16/2023   TRIG 110.0 09/16/2023   HDL 38.10 (L) 09/16/2023   LDLDIRECT 71.0 03/05/2019   LDLCALC 45 09/16/2023   ALT 19 06/30/2024   AST 18 06/30/2024   NA 141  06/30/2024   K 3.8 06/30/2024   CL 102 06/30/2024   CREATININE 1.15 06/30/2024   BUN 20 06/30/2024   CO2 31 06/30/2024   TSH 1.54 09/16/2023   PSA 1.03 12/17/2023   INR 1.3 04/08/2008   HGBA1C 6.7 (H) 06/30/2024   MICROALBUR 18.8 (H) 09/16/2023    ECHOCARDIOGRAM COMPLETE Result Date: 05/26/2024    ECHOCARDIOGRAM REPORT   Patient Name:   [name] Langland Paganelli Jr. Date of Exam: 05/26/2024 Medical Rec #:  [id number]           Height:       71.0 in Accession #:    [id number]          Weight:       195.2 lb Date of Birth:  Nov 08, 1950           BSA:          2.087 m Patient Age:    73 years            BP:           138/76 mmHg Patient Gender: M                   HR:           60 bpm. Exam Location:  [address] Procedure: 2D Echo, Color Doppler, Cardiac Doppler and 3D Echo (Both Spectral            and Color Flow Doppler were utilized during procedure). Indications:    Aortic Regurgitation I35.1; Dilated Ascending Aorta I77.810  History:        Patient has prior history of Echocardiogram examinations, most                 recent 05/17/2023. CAD and Previous Myocardial Infarction, Prior                 CABG; Risk Factors:Hypertension, Dyslipidemia and Diabetes.  Sonographer:    Augustin Seals RDCS Referring Phys: [phone] MARK C SKAINS  Sonographer Comments: Global longitudinal strain was attempted. IMPRESSIONS  1. Left ventricular ejection fraction, by estimation, is 60 to 65%. The left ventricle has normal function. The left ventricle has no regional wall motion abnormalities. Left ventricular diastolic parameters are consistent with Grade I diastolic dysfunction (impaired relaxation).  2. Right ventricular systolic function is normal. The right ventricular size is mildly enlarged. There is normal pulmonary artery systolic pressure.   3. Right atrial size was mildly dilated.  4. The mitral valve is normal in structure. Trivial mitral valve regurgitation. No evidence of mitral stenosis.  5. The aortic valve is tricuspid. Aortic valve regurgitation is mild. No aortic stenosis is present.  6. Aortic dilatation noted. There is mild dilatation of the ascending aorta, measuring 43 mm.  7. The inferior vena cava is normal in size with greater than 50% respiratory variability, suggesting right atrial pressure of 3 mmHg. FINDINGS  Left Ventricle: Left ventricular ejection fraction, by estimation, is 60 to 65%. The left ventricle has normal function. The left ventricle has no regional wall motion abnormalities. The left ventricular internal cavity size was normal in size. There is  no left ventricular hypertrophy. Left ventricular diastolic parameters are consistent with Grade I diastolic dysfunction (impaired relaxation). Right Ventricle: The right ventricular size is mildly enlarged. Right ventricular systolic function is normal. There is normal pulmonary artery systolic pressure. The tricuspid regurgitant velocity is 2.30 m/s, and with an assumed right atrial pressure of 3 mmHg, the estimated right ventricular  systolic pressure is 24.2 mmHg. Left Atrium: Left atrial size was normal in size. Right Atrium: Right atrial size was mildly dilated. Pericardium: There is no evidence of pericardial effusion. Mitral Valve: The mitral valve is normal in structure. Trivial mitral valve regurgitation. No evidence of mitral valve stenosis. Tricuspid Valve: The tricuspid valve is normal in structure. Tricuspid valve regurgitation is trivial. No evidence of tricuspid stenosis. Aortic Valve: The aortic valve is tricuspid. Aortic valve regurgitation is mild. Aortic regurgitation PHT measures 488 msec. No aortic stenosis is present. Pulmonic Valve: The pulmonic valve was normal in structure. Pulmonic valve regurgitation is trivial. No evidence of pulmonic stenosis. Aorta:  Aortic dilatation noted. There is mild dilatation of the ascending aorta, measuring 43 mm. Venous: The inferior vena cava is normal in size with greater than 50% respiratory variability, suggesting right atrial pressure of 3 mmHg. IAS/Shunts: The interatrial septum is aneurysmal. No atrial level shunt detected by color flow Doppler. Additional Comments: 3D was performed not requiring image post processing on an independent workstation and was normal.  LEFT VENTRICLE PLAX 2D LVIDd:         4.20 cm   Diastology LVIDs:         2.90 cm   LV e' medial:    6.96 cm/s LV PW:         1.10 cm   LV E/e' medial:  12.7 LV IVS:        1.10 cm   LV e' lateral:   7.62 cm/s LVOT diam:     2.50 cm   LV E/e' lateral: 11.6 LV SV:         145 LV SV Index:   70 LVOT Area:     4.91 cm                           3D Volume EF:                          3D EF:        64 %                          LV EDV:       138 ml                          LV ESV:       49 ml                          LV SV:        89 ml RIGHT VENTRICLE             IVC RV Basal diam:  5.40 cm     IVC diam: 1.90 cm RV Mid diam:    5.70 cm RV S prime:     12.10 cm/s  PULMONARY VEINS TAPSE (M-mode): 2.3 cm      A Reversal Velocity: 45.20 cm/s                             Diastolic Velocity:  53.80 cm/s                             S/D Velocity:  1.00                             Systolic Velocity:   56.10 cm/s LEFT ATRIUM             Index        RIGHT ATRIUM           Index LA diam:        3.80 cm 1.82 cm/m   RA Area:     21.80 cm LA Vol (A2C):   52.3 ml 25.06 ml/m  RA Volume:   69.30 ml  33.21 ml/m LA Vol (A4C):   47.1 ml 22.57 ml/m LA Biplane Vol: 51.8 ml 24.82 ml/m  AORTIC VALVE LVOT Vmax:   128.00 cm/s LVOT Vmean:  87.200 cm/s LVOT VTI:    0.296 m AI PHT:      488 msec  AORTA Ao Root diam: 3.90 cm Ao Asc diam:  4.30 cm MITRAL VALVE                TRICUSPID VALVE MV Area (PHT): 3.65 cm     TR Peak grad:   21.2 mmHg MV Decel Time: 208 msec     TR Vmax:         230.00 cm/s MV E velocity: 88.20 cm/s MV A velocity: 117.00 cm/s  SHUNTS MV E/A ratio:  0.75         Systemic VTI:  0.30 m                             Systemic Diam: 2.50 cm Redell Shallow MD Electronically signed by Redell Shallow MD Signature Date/Time: 05/26/2024/8:53:37 AM    Final     Assessment & Plan:   Problem List Items Addressed This Visit     Type 2 diabetes mellitus in patient with obesity (HCC) - Primary   Cont on Glipizide , Metformin   Jardiance  - increase to 25 mg/d Worse a little. Adding Mounjaro vs Ozempic was discussed - not interested at the moment       Relevant Orders   Hemoglobin A1c   Comprehensive metabolic panel with GFR   TSH   Urinalysis   CBC with Differential/Platelet   PSA   Lipid panel   Microalbumin / creatinine urine ratio   Mixed hyperlipidemia   On Lipitor      CORONARY ARTERY BYPASS GRAFT, HX OF   CAD (coronary artery disease)   Hypertension associated with diabetes (HCC)    Adding Mounjaro vs Ozempic was discussed - not interested at the moment       Well adult exam   Relevant Orders   TSH   Urinalysis   CBC with Differential/Platelet   PSA   Lipid panel   Microalbumin / creatinine urine ratio   Elevated PSA   Relevant Orders   PSA   Acute sinusitis   Not better Omnicef  prn      Relevant Medications   cefdinir  (OMNICEF ) 300 MG capsule      Meds ordered this encounter  Medications   cefdinir  (OMNICEF ) 300 MG capsule    Sig: Take 1 capsule (300 mg total) by mouth 2 (two) times daily.    Dispense:  20 capsule    Refill:  0      Follow-up: Return in about 3 months (around 10/04/2024) for Wellness Exam.  Marolyn Noel, MD "

## 2024-07-06 NOTE — Assessment & Plan Note (Signed)
  Adding Mounjaro vs Ozempic was discussed - not interested at the moment

## 2024-08-07 ENCOUNTER — Ambulatory Visit: Payer: Medicare Other

## 2024-10-05 ENCOUNTER — Ambulatory Visit: Admitting: Internal Medicine
